# Patient Record
Sex: Female | Born: 1965 | Race: White | Hispanic: Yes | Smoking: Never smoker
Health system: Southern US, Community
[De-identification: ages and names within clinical notes are randomized; demographics above are authoritative.]

## PROBLEM LIST (undated history)

## (undated) DIAGNOSIS — I10 Essential (primary) hypertension: Secondary | ICD-10-CM

## (undated) DIAGNOSIS — G43909 Migraine, unspecified, not intractable, without status migrainosus: Secondary | ICD-10-CM

## (undated) DIAGNOSIS — E782 Mixed hyperlipidemia: Secondary | ICD-10-CM

## (undated) DIAGNOSIS — D649 Anemia, unspecified: Secondary | ICD-10-CM

## (undated) DIAGNOSIS — E669 Obesity, unspecified: Secondary | ICD-10-CM

## (undated) DIAGNOSIS — E1165 Type 2 diabetes mellitus with hyperglycemia: Secondary | ICD-10-CM

## (undated) DIAGNOSIS — E785 Hyperlipidemia, unspecified: Secondary | ICD-10-CM

## (undated) DIAGNOSIS — I2510 Atherosclerotic heart disease of native coronary artery without angina pectoris: Secondary | ICD-10-CM

## (undated) DIAGNOSIS — E119 Type 2 diabetes mellitus without complications: Secondary | ICD-10-CM

## (undated) DIAGNOSIS — K219 Gastro-esophageal reflux disease without esophagitis: Secondary | ICD-10-CM

## (undated) HISTORY — DX: Anemia, unspecified: D64.9

## (undated) HISTORY — DX: Obesity, unspecified: E66.9

## (undated) HISTORY — DX: Hyperlipidemia, unspecified: E78.5

## (undated) HISTORY — DX: Mixed hyperlipidemia: E78.2

## (undated) HISTORY — DX: Atherosclerotic heart disease of native coronary artery without angina pectoris: I25.10

## (undated) HISTORY — DX: Type 2 diabetes mellitus without complications: E11.9

## (undated) HISTORY — DX: Gastro-esophageal reflux disease without esophagitis: K21.9

## (undated) HISTORY — DX: Essential (primary) hypertension: I10

## (undated) HISTORY — DX: Type 2 diabetes mellitus with hyperglycemia: E11.65

## (undated) HISTORY — DX: Migraine, unspecified, not intractable, without status migrainosus: G43.909

## (undated) HISTORY — PX: CARDIAC CATHETERIZATION: SHX172

---

## 2003-01-17 ENCOUNTER — Ambulatory Visit (HOSPITAL_COMMUNITY): Admission: RE | Admit: 2003-01-17 | Discharge: 2003-01-17 | Payer: Self-pay | Admitting: *Deleted

## 2003-01-19 ENCOUNTER — Ambulatory Visit (HOSPITAL_COMMUNITY): Admission: RE | Admit: 2003-01-19 | Discharge: 2003-01-20 | Payer: Self-pay | Admitting: *Deleted

## 2003-05-30 ENCOUNTER — Ambulatory Visit (HOSPITAL_COMMUNITY): Admission: RE | Admit: 2003-05-30 | Discharge: 2003-05-30 | Payer: Self-pay | Admitting: Cardiology

## 2004-02-07 ENCOUNTER — Observation Stay (HOSPITAL_COMMUNITY): Admission: EM | Admit: 2004-02-07 | Discharge: 2004-02-08 | Payer: Self-pay | Admitting: Cardiology

## 2004-02-27 ENCOUNTER — Ambulatory Visit: Payer: Self-pay | Admitting: *Deleted

## 2006-01-22 ENCOUNTER — Encounter: Admission: RE | Admit: 2006-01-22 | Discharge: 2006-01-22 | Payer: Self-pay | Admitting: Family Medicine

## 2006-05-07 ENCOUNTER — Ambulatory Visit (HOSPITAL_COMMUNITY): Admission: RE | Admit: 2006-05-07 | Discharge: 2006-05-07 | Payer: Self-pay | Admitting: Otolaryngology

## 2007-03-10 ENCOUNTER — Ambulatory Visit (HOSPITAL_COMMUNITY): Admission: RE | Admit: 2007-03-10 | Discharge: 2007-03-10 | Payer: Self-pay | Admitting: Cardiovascular Disease

## 2007-03-10 ENCOUNTER — Ambulatory Visit: Payer: Self-pay | Admitting: Cardiovascular Disease

## 2007-03-11 ENCOUNTER — Inpatient Hospital Stay (HOSPITAL_BASED_OUTPATIENT_CLINIC_OR_DEPARTMENT_OTHER): Admission: RE | Admit: 2007-03-11 | Discharge: 2007-03-11 | Payer: Self-pay | Admitting: Cardiovascular Disease

## 2007-03-11 ENCOUNTER — Ambulatory Visit: Payer: Self-pay | Admitting: Cardiology

## 2007-04-07 ENCOUNTER — Ambulatory Visit: Payer: Self-pay | Admitting: Cardiovascular Disease

## 2010-09-10 NOTE — Assessment & Plan Note (Signed)
[facility]                       [facility] OFFICE NOTE   Hoffman, Yesenia                      MRN:          [id number]  DATE:03/10/2007                            DOB:          [birth year]    Yesenia Hoffman is a 45 year old patient, previously seen by Dr. Daleen Squibb and  Dr. [name]. She has coronary risk factors including hypertension,  hyperlipidemia, diabetes. She has a history of Taxus stenting of the LAD  in September of 2004. Repeat heart catheterization in 2005 showed the  stent to be patent.   The patient apparently had some thrombocytopenia after her last  intervention.   I am seeing her for the first time. She does not speak [place]. Her  significant other interpreted for me. She was seen in the clinic today  for increasing chest pain. This has been exertional, and it does sound  anginal and has been going on for the last two to three weeks. It  radiates up towards her neck and her back. It is identical to the pain  that she had prior to her stents. She has not had any nitroglycerin at  home today. She currently is not having any rest pain.   After discussion with Keayra through her significant other's  interpretation, I told her that she needed a heart catheterization. She  is currently taking aspirin but is not on Plavix.   We will likely give her prescriptions for it and have her loaded with  300 of Plavix tonight and take 300 in the morning. I do not think she  will have surgical disease. She did not have significant circumflex or  RCA disease.   Her review of systems is otherwise negative.   Her medications include:  1. Aspirin a day.  2. Atenolol 25 a day.  3. Senokot.  4. Metformin 500 b.i.d.  5. She has previously been on Zocor. I believe she had myalgias with      it.   Her exam is remarkable for an overweight, [place] female who does not  speak [place]. Affect is appropriate. Weight is 159. Blood pressure is  122/80, pulse 62 and regular, afebrile, respiratory rate 14.  HEENT:  Unremarkable.  No bruit, no lymphadenopathy, no thyromegaly, no JVP elevation.  LUNGS:  Clear. Good diaphragmatic motion. No wheezing.  S1 and S2 with distant heart sounds. PMI not palpable.  ABDOMEN:  Is benign. Bowel sounds are positive. No AAA. No bruit. No  hepatosplenomegaly. No hepatojugular reflux.  Distal pulses are intact. No edema.  NEUROLOGICAL:  Nonfocal. No musculoskeletal weakness.   EKG in the office today was normal.   IMPRESSION:  1. Recurrent chest pain, anginal sounding, in the setting of previous      stenting in 2004. Catheterization to be arranged. Hold metformin      tonight. Load with Plavix.  2. History of hypertension. Continue atenolol 25 a day. Blood pressure      currently under good control.  3. Diabetes. Check hemoglobin A1c. Continue oral hypoglycemics. Follow      up with primary care M.D. who I believe is with the health  department.  4. Hyperlipidemia. We will have to readdress this. It is not clear to      me exactly why her Zocor was stopped, but particularly if she has      restenosis, she will need to be on a statin drug.     Noralyn Pick. Eden Emms, MD, [facility]  Electronically Signed    PCN/MedQ  DD: 03/10/2007  DT: 03/11/2007  Job #: [id number]

## 2010-09-10 NOTE — Assessment & Plan Note (Signed)
[facility]                        CARDIOLOGY OFFICE NOTE   Yesenia, MCCAMY                      MRN:          [id number]  DATE:04/07/2007                            DOB:          [birth year]    Yesenia Hoffman returns today for followup.  She does not speak [place];  she is from [place].  Her husband translated for [place].  She complained of  recurrent chest pain when I last saw her.  She had a previous Taxus  stent to the LAD.  She had repeat heart catheterization by Dr. Riley Kill  on March 11, 2007.  The stent was widely patent and there was no  residual disease.  She has done well since her discharge.  She is a  diabetic.  Her blood pressure is borderline.  She is not on an ARB or an  ACE.  She previously had been on Diovan; I am not sure why it was  stopped.  I told her I would prefer to start her back on lisinopril  today.  She will get her BMET checked in 6 weeks at the health  department.   REVIEW OF SYSTEMS:  Otherwise negative.  Particularly, she is not having  any recurrent chest pain.  There has been no pain at the catheterization  site, no PND or orthopnea, no palpitations or syncope.  Her review of  systems is otherwise.   MEDICATIONS:  1. An aspirin a day.  2. Senokot.  3. Metformin 500 b.i.d.  4. Glipizide 5 b.i.d.  5. Diazepam 5 b.i.d.  6. Atenolol 50 mg a day.   EXAMINATION:  Remarkable for an overweight [place] female in no  distress.  Affect is appropriate.  Weight is 164, blood pressure is  140/90, pulse 70 and regular, afebrile, respiratory rate 14.  HEENT:  Unremarkable.  Carotids normal without bruit.  No  lymphadenopathy, thyromegaly or JVP elevation.  LUNGS:  Clear with good diaphragmatic motion, no wheezing.  S1, S2 with normal heart sounds.  PMI not palpable.  ABDOMEN:  Benign.  No renal artery stenosis or bruits.  No AAA.  No  tenderness.  No hepatosplenomegaly or hepatojugular reflux.  Catheterization  site is well healed with no ecchymosis or bruit.  Distal pulses are intact, no edema.  NEUROLOGIC:  Nonfocal.  No muscular weakness.  SKIN:  Warm and dry.   IMPRESSION:  1. Coronary disease with recurrent chest pain.  Stent widely patent.      Continue aspirin and beta blocker.  2. Diabetes with hypertension.  Add lisinopril 10 mg a day.  Followup      BMET in 6 weeks.  3. Coronary disease with dyslipidemia.  The patient had previously      been on Zocor.  Again, I      am not sure why this has been stopped.  She will have a lipid and      liver profile and consider reinstituting statin drug.   I will see her back in 6 months.     Noralyn Pick. Eden Emms, MD, [facility]  Electronically Signed    PCN/MedQ  DD: 04/07/2007  DT: 04/08/2007  Job #: [id number]

## 2010-09-10 NOTE — Cardiovascular Report (Signed)
Yesenia Hoffman, Yesenia Hoffman               ACCOUNT NO.:  [id number]   MEDICAL RECORD NO.:  [id number]          PATIENT TYPE:  OIB   LOCATION:  1962                         FACILITY:  [facility]   PHYSICIAN:  Arturo Morton. Riley Kill, MD, FACCDATE OF BIRTH:  Nov 24, 1965   DATE OF PROCEDURE:  03/11/2007  DATE OF DISCHARGE:  03/11/2007                            CARDIAC CATHETERIZATION   INDICATIONS:  Ms. Seyller is a 45 year old female who presents with some  recurrent chest pain.  She previously had a drug-eluting stent placed in  the proximal left anterior descending artery.  She has been off Plavix  now for approximately 2 years.   PROCEDURE:  1. Left heart catheterization.  2. Selective coronary arteriography.  3. Selective left ventriculography.   DESCRIPTION OF THE PROCEDURE:  The patient was brought to the  catheterization laboratory and prepped and draped in the usual fashion.  Through an anterior puncture the femoral artery was entered and a 4-  [place] sheath was placed.  Views of the left and right coronary arteries  were then obtained in multiple angiographic projections.  Central aortic  and left ventricular pressures were measured with a pigtail catheter.  Ventriculography was subsequently performed in the RAO projection  without complication.  The pigtail catheter was subsequently removed.  She tolerated the procedure well.  There were no major complications.  We used an interpreter both to gain a consent and also during the  procedure to assist in communication.   HEMODYNAMIC DATA:  1. Central aortic pressure 170/95, mean 125.  2. Left ventricular pressure 165/11.   ANGIOGRAPHIC DATA:  1. Ventriculography was done in the RAO projection.  LV function was      well preserved.  No segmental abnormalities of contraction were      identified.  2. The left main was free of critical disease.  3. The left anterior descending artery coursed to the apex.  There was      stented proximal LAD  with minimal luminal irregularity.  The stent      had less than 20% narrowing.  There was a diagonal branch that came      out in the middle of the stent with about 40% ostial narrowing.      The second and third diagonal branches were free of critical      disease.  4. The circumflex is a large-caliber vessel providing two major      marginal branches.  There is a tiny first marginal branch that is      insignificant.  Prior to the first marginal branch there is      approximately 20% mild irregularity.  5. The right coronary artery is a large-caliber vessel providing      posterior descending and posterolateral branch, both of which      appear free of critical disease.   CONCLUSIONS:  1. Normal left ventricular function.  2. Continued patency of the Taxus drug-eluting stent.  3. Hypertension.   The patient will be given her medicines blood pressure medicines for  today.      Arturo Morton. Riley Kill, MD, [facility]  Electronically Signed     TDS/MEDQ  D:  03/11/2007  T:  03/12/2007  Job:  [id number]   cc:   Noralyn Pick. Eden Emms, MD, [facility]  CV Laboratory

## 2010-09-13 NOTE — Procedures (Signed)
Yesenia, Hoffman                           ACCOUNT NO.:  [id number]   MEDICAL RECORD NO.:  [id number]                  PATIENT TYPE:  PREC   LOCATION:                                       FACILITY:   PHYSICIAN:  [name], M.D.                DATE OF BIRTH:   DATE OF PROCEDURE:  DATE OF DISCHARGE:                                    STRESS TEST   INDICATION:  Yesenia Hoffman is a 45 year old female with known coronary artery  disease status post stent to her LAD in September of 2004, and PTCA to her  first diagonal in September as well.  She now presents with recurrent chest  discomfort.   BASELINE DATA:  EKG reveals sinus rhythm at 67 beats per minute with  nonspecific ST-abnormalities.  Blood pressure is 140/86.  The patient  exercised for a total of six minutes and 36 seconds to Bruce protocol stage  3.  Maximum heart rate was 163 beats per minute which is 90% of predicted  maximum.  Maximal blood pressure is 200/98 which resolved down to 138/90 in  recovery.  EKG revealed no arrhythmias and no ischemic changes.  The patient  did report chest discomfort radiating to her jaws and shortness of breath.  This resolved in recovery.   Final images and results are pending M.D. review.     ________________________________________  ___________________________________________  Jae Dire, P.A. [facility]                      [name], M.D.   AB/MEDQ  D:  05/30/2003  T:  05/30/2003  Job:  [id number]

## 2010-09-13 NOTE — Cardiovascular Report (Signed)
Yesenia Hoffman, Yesenia Hoffman               ACCOUNT NO.:  [id number]   MEDICAL RECORD NO.:  [id number]          PATIENT TYPE:  OBV   LOCATION:  6527                         FACILITY:  [facility]   PHYSICIAN:  Salvadore Farber, M.D. LHCDATE OF BIRTH:  1965/12/25   DATE OF PROCEDURE:  02/27/2004  DATE OF DISCHARGE:  02/08/2004                              CARDIAC CATHETERIZATION   PROCEDURE:  Left heart catheterization, left ventriculography, coronary  angiography.   INDICATIONS FOR PROCEDURE:  Ms. Horlacher is a 45 year old lady with coronary  artery disease status post LAD stent who presents with chest discomfort  concerning for unstable angina.  She was ruled out for myocardial infarction  by serial enzymes and EKGs.  She was referred for diagnostic angiography.   PROCEDURE TECHNIQUE:  Informed consent was obtained.  Under 1% lidocaine  local anesthesia, a 5 French sheath was placed in the right common femoral  artery using the modified Seldinger technique.  Diagnostic angiography and  ventriculography were performed using JL4, JR4, and pigtail catheters.  The  patient tolerated the procedure well and was transferred to the holding room  in stable condition.  The sheaths will be removed there.   COMPLICATIONS:  None.   FINDINGS:  1.  Left ventricle:  154/15/25.  EF 60% without regional wall motion      abnormality.  2.  No aortic stenosis or mitral regurgitation.  3.  Left main:  Angiographically normal.  4.  LAD:  Moderate size vessel giving rise to two relatively small diagonal      branches.  There is a widely patent stent in the proximal LAD jailing      the first diagonal branch.  This first diagonal has a 60% ostial      stenosis.  The remainder of the LAD and diagonals are normal.  5.  Circumflex:  Moderate size vessel giving rise to two obtuse marginals.      It is angiographically normal.  6.  Right coronary artery:  Moderate size dominant vessel.  It is      angiographically  normal.   IMPRESSION/RECOMMENDATIONS:  There is a widely patent LAD stent with  moderate stenosis at the first diagonal.  This diagonal stenosis is likely  chronic related to jailing with the LAD stent.  I doubt that myocardial  ischemia was the etiology of her resting chest discomfort.  We will plan on  discharging her home today.       WED/MEDQ  D:  02/27/2004  T:  02/27/2004  Job:  [id number]   cc:   Jonelle Sidle, M.D. [facility]   Xaje Hasanaj  [address]  [place]  [place] [address]  Fax: [phone]

## 2010-09-13 NOTE — Cardiovascular Report (Signed)
[name], [name]                           ACCOUNT NO.:  [id number]   MEDICAL RECORD NO.:  [id number]                   PATIENT TYPE:  OIB   LOCATION:  2869                                 FACILITY:  [facility]   PHYSICIAN:  Veneda Melter, M.D.                   DATE OF BIRTH:  1965-07-15   DATE OF PROCEDURE:  01/19/2003  DATE OF DISCHARGE:                              CARDIAC CATHETERIZATION   PROCEDURES PERFORMED:  PTCA and stent placement proximal left anterior  descending.   DIAGNOSES:  1. Single vessel coronary artery disease.  2. Angina pectoris.   HISTORY:  Yesenia Hoffman is a 45 year old Hispanic female who presents with  crescendo angina.  The patient had abnormal stress test and underwent  cardiac catheterization showing high grade narrowing of 80% in the proximal  LAD encompassing the first diagonal branch.  She is referred for  percutaneous coronary intervention.   TECHNIQUE:  Informed consent was obtained.  An existing 6-French sheath in  the right groin was utilized.  The patient had been pre treated with  aspirin.  She was given heparin and ReoPro on a weight adjusted basis to  maintain an ACT of greater than 225 seconds and Plavix 600 mg was given as  well.  A 6-French CLS3.5 guide catheter was used to engage the left coronary  artery and selective guide shots obtained.  This confirmed the presence of  high grade narrowing of 80% in the proximal LAD encompassing the first  diagonal branch with ostial narrowing of 50-60% in this first diagonal  branch.  A 0.014 inch ATW marker wires advanced in the distal LAD and used  to size the vessel diameter lesion and length.  A 3 x 16 mm Taxus Express-2  stent was then introduced, carefully positioned in the proximal LAD, and  deployed at 10 atmospheres for 45 seconds.  A 3 x 12 mm Quantum Maverick  balloon was then used to post dilate the stent.  Two inflations were  performed at 12 atmospheres for 30 seconds.  A second  inflation of 16  atmospheres for 30 seconds within the proximal segment of the stent.  Repeat  angiography showed an excellent result with full coverage of the lesion, no  residual stenosis or vessel damage.  The diagonal branch was further  compromised with ostial narrowing of 70-80%, although it did appear to have  TIMI grade 3 flow.  The wire was repositioned in the first diagonal branch  and a 2.5 x 9 mm Maverick balloon used to dilate the ostium.  Two inflations  were performed at 6 atmospheres for 15 seconds.  Repeat angiography now  showed an excellent result with less than 30% residual narrowing at the  ostium of the diagonal branch.  There was no vessel damage and TIMI 3 flow  through the LAD and its branches.  Final angiography was performed in  various projections confirming these findings.  The guide catheter was then  removed and an AngioSeal closure device deployed to the right femoral artery  until adequate hemostasis was achieved.  The patient tolerated procedure  well, was transferred to the floor in stable condition.   FINAL RESULTS:  Successful PTCA and stent placement in the proximal LAD with  reduction of 80% narrowing to 0% with placement of 3.0 x 16 mm Taxus Express-  2 stent.                                                Veneda Melter, M.D.    Melton Alar  D:  01/19/2003  T:  01/19/2003  Job:  [id number]

## 2010-09-13 NOTE — Cardiovascular Report (Signed)
Yesenia Hoffman, [name]                           ACCOUNT NO.:  [id number]   MEDICAL RECORD NO.:  [id number]                   PATIENT TYPE:  OIB   LOCATION:  2869                                 FACILITY:  [facility]   PHYSICIAN:  Jonelle Sidle, M.D. [facility]        DATE OF BIRTH:  12/16/1965   DATE OF PROCEDURE:  01/19/2003  DATE OF DISCHARGE:                              CARDIAC CATHETERIZATION   PRIMARY CARE:  [facility].   CARDIOLOGIST:  [name], M.D.   INDICATIONS:  Unstable angina and abnormal exercise treadmill test.   PROCEDURES PERFORMED:  1. Left heart catheterization.  2. Selective coronary angiography.  3. Left ventriculography.   ACCESS AND EQUIPMENT:  The area about the right femoral artery was  anesthetized with 1% lidocaine and a 6-French sheath was placed in the right  femoral artery via the modified Seldinger technique.  Standard preformed 6-  [place] and JR4 catheters were used for selective coronary angiography  and an angled pigtail catheter was used for left heart catheterization and  left ventriculography.  Exchanges were made over a wire and the patient  tolerated the procedure well without immediate complications.  She did  develop mild chest discomfort during angiography and was ultimately placed  on a low dose nitroglycerin drip with improvement in symptoms.  She remained  hemodynamically stable.   HEMODYNAMICS:  1. Left ventricle 139/20 mmHg post angiography.  2. Aorta 139/90 mmHg.   ANGIOGRAPHIC FINDINGS:  1. The left main coronary artery is free of significant flow limiting     coronary atherosclerosis.  2. The left anterior descending is a medium caliber vessel with two diagonal     branches.  There is a fairly focal 90% stenosis in the proximal vessel     involving the ostium of the proximal diagonal branch.  Flow is TIMI 3.  3. The circumflex coronary artery is free of significant flow limiting     coronary  atherosclerosis.  There are two obtuse marginal branches.  4. The right coronary artery is a dominant and free of significant flow     limiting coronary atherosclerosis.   LEFT VENTRICULOGRAPHY:  Left ventriculography was performed in the RAO  projection revealing an ejection fraction of approximately 65% with no focal  wall motion abnormalities or mitral regurgitation.   DIAGNOSES:  1. Severe single vessel coronary artery disease involving the proximal left     anterior descending.  2. Left ventricular ejection fraction of approximately 65% with no     significant mitral regurgitation.    RECOMMENDATIONS:  I have discussed the films with Veneda Melter, M.D.  Assuming that there is no change in the left anterior descending following  intracoronary nitroglycerin (excludes spasm), then plan will be to proceed  with percutaneous intervention.  Jonelle Sidle, M.D. [facility]    SGM/MEDQ  D:  01/19/2003  T:  01/19/2003  Job:  [id number]

## 2010-09-13 NOTE — Discharge Summary (Signed)
[name], Yesenia Hoffman                           ACCOUNT NO.:  [id number]   MEDICAL RECORD NO.:  [id number]                   PATIENT TYPE:  OIB   LOCATION:  6532                                 FACILITY:  [facility]   PHYSICIAN:  [name], M.D.                DATE OF BIRTH:  20-Oct-1965   DATE OF ADMISSION:  01/19/2003  DATE OF DISCHARGE:                                 DISCHARGE SUMMARY   DISCHARGE DIAGNOSES:  1. Chest discomfort and abnormal exercise treadmill test.  2. Coronary artery disease.     a. Status post TAXUS stent to the proximal left anterior descending.  3. Hypertension.  4. Thrombocytopenia.  5. Hyperglycemia.  6. Dyslipidemia.   PROCEDURES PERFORMED -- THIS ADMISSION:  Cardiac catheterization and  percutaneous coronary intervention by Dr. Veneda Melter on January 19, 2003.   HOSPITAL COURSE:  Please see the office note by Dr. [name] for  complete details.  Briefly, this 45 year old female presented to our  [name] office for evaluation of chest discomfort.  She underwent  exercise treadmill testing.  During that time, she began to have chest  discomfort and ST depression in the inferolateral leads.  During recovery,  she had downsloping ST depression and T wave inversions in the inferolateral  leads.  Nitroglycerin relieved her pain.  She was referred for  catheterization to further evaluate.  This was performed on January 19, 2003.  Dr. [name] performed intervention of the proximal LAD by placing a  TAXUS stent and reducing stenosis from 80% to 0%.  The patient tolerated the  procedure well and had no immediate complications.  On the morning of  January 20, 2003, she was in stable condition.  Her vital signs were  stable and her right groin was without hematoma.  Her morning labs did show  that her platelet count had dropped from 92,000 to 74,000.  She would need  close followup with this with the addition of aspirin and Plavix to her  medical  regimen.  Her creatinine was stable at 0.6.  Glucose was elevated at  136 on her morning fasting labs.  Hemoglobin A1c will be sent off at the  time of discharge to rule out diabetes mellitus.  Given her known coronary  disease, she was started on a statin and baseline LFTs and lipids will be  checked prior to discharge.  She will need followup next week in our  [name] office with Dr. [name] with a CBC.   LABORATORIES:  White count 7800, hemoglobin 11.9, hematocrit 34%, platelet  count 74,000.  Sodium 137, potassium 3.9, BUN 8, creatinine 0.6, glucose  136.  Post-procedure CK-MB 0.5.   Admission chest x-ray:  No acute cardiopulmonary process.   DISCHARGE MEDICATIONS:  1. Aspirin 325 mg daily.  2. Plavix 75 mg daily.  3. Lopressor 25 mg twice daily.  4. Nitroglycerin as needed for (p.r.n.) chest pain.  5. Zocor 20 mg nightly.   PAIN MANAGEMENT:  1. Tylenol as needed.  2. Nitroglycerin as needed for chest pain.  3. She is to call our office in [place] or 911 with recurrent chest pain.   ACTIVITY:  No driving for at least three days.  No heavy lifting, exertion  or sex for two weeks.   DIET:  Low fat, low sodium.   WOUND CARE:  The patient is to call our office in [place] for any groin  swelling, bleeding or bruising.   FOLLOWUP:  Followup is with Dr. [name] in [place] next week.  The office  will call her with an appointment.  She will have a CBC checked next week at  her followup appointment to recheck her platelet count.  She will need  lipids and LFTs checked in about eight weeks.  At her followup appointment,  we will follow up on her hemoglobin A1c to make sure that she is not a  diabetic.  If her hemoglobin A1c is elevated, she will need referral to a  primary care physician to follow her diabetes.       [name], P.A.                        [name], M.D.    SW/MEDQ  D:  01/20/2003  T:  01/20/2003  Job:  [id number]   cc:   [name],  M.D.  Fax: [phone]

## 2010-09-13 NOTE — Discharge Summary (Signed)
Yesenia Hoffman, Yesenia Hoffman               ACCOUNT NO.:  [id number]   MEDICAL RECORD NO.:  [id number]          PATIENT TYPE:  INP   LOCATION:  2853                         FACILITY:  [facility]   PHYSICIAN:  Jesse Sans. Wall, M.D.   DATE OF BIRTH:  Sep 05, 1965   DATE OF ADMISSION:  02/07/2004  DATE OF DISCHARGE:                                 DISCHARGE SUMMARY   ADDENDUM:  The patient will also be sent home on Protonix 40 mg a day to  cover for possible gastroesophageal reflux disease etiology for her chest  pain.       SW/MEDQ  D:  02/08/2004  T:  02/08/2004  Job:  [id number]   cc:   [name], M.D.  Fax: [phone]   Annette Stable Hasanaj  [address]  [place]  [place] [address]  Fax: [phone]

## 2010-09-13 NOTE — Discharge Summary (Signed)
Yesenia Hoffman, Yesenia Hoffman               ACCOUNT NO.:  [id number]   MEDICAL RECORD NO.:  [id number]          PATIENT TYPE:  INP   LOCATION:  2853                         FACILITY:  [facility]   PHYSICIAN:  Jesse Sans. Wall, M.D.   DATE OF BIRTH:  [birth year]   DATE OF ADMISSION:  02/07/2004  DATE OF DISCHARGE:  02/08/2004                                 DISCHARGE SUMMARY   DISCHARGE DIAGNOSIS:  1.  Chest pain, etiology unclear.  2.  Coronary artery disease.      1.  Status post Taxos stenting to the left anterior descending  in          September 2004.      2.  Patent stent by catheterization by admission.  3.  Good left ventricular  function.  4.  Hypertension.  5.  Dyslipidemia.  6.  Diabetes mellitus type 2.  7.  History of transient thrombocytopenia following intervention in 2004.   PROCEDURE:  Cardiac catheterization by Dr. Samule Ohm on February 08, 2004,  revealing patent stent to the LAD, 60% ostial first diagonal lesion and a  small vessel, EF 60%.   HOSPITAL COURSE:  Please see the consult note from Dr. Samule Ohm at [facility] on February 06, 2004, for complete details.  Briefly, this 45-year-  old female patient was admitted to [facility] with recurrent chest  discomfort.  Her cardiac markers were normal.  Chest x-ray showed no acute  abnormalities.  EKG showed nonspecific T-wave changes.  She was transferred  to [facility] for further evaluation to include cardiac  catheterization.  This was performed by Dr. Clovis Riley on February 08, 2004.  The  results are as noted above.  Her pain was not felt to be secondary to  ischemia.  She was continued on her medical therapy.  At the time of this  dictation, she is still pending post catheterization bedrest.  As long as  her groin remains stable post catheterization, she will be discharged home  later tonight.  She will need follow up with Dr. [name] in [place] in the  next couple of weeks.   LABORATORY DATA:  Chest x-ray  as noted above.  White count 9100, hemoglobin  12.3, hematocrit 34.3, platelet count 176,000.  INR 1.07.  Sodium 133,  potassium 4, chloride 99, CO2 27.  Glucose 168.  BUN 10, creatinine 0.6.  Calcium 8.8.  Total cholesterol 121, triglyceride 707, HDL 25, LDL not  calculated.   DISCHARGE MEDICATIONS:  Aspirin 325 mg daily, Plavix 75 mg daily, Atenolol  25 mg daily, Senokot p.r.n., Diovan 160 mg daily, Zocor 20 mg q.h.s.,  nitroglycerin p.r.n. chest pain, Metformin 500 mg b.i.d., the patient has to  restart this on Sunday, February 11, 2004.  Pain management, Tylenol as  needed.  Nitroglycerin as needed for chest pain.   DISCHARGE INSTRUCTIONS:  She is to call our office or 911 for recurrent  chest pain.  Activity:  No driving, heavy exertion, work or sex for the next  three days.  Diet is low fat, low sodium, diabetic diet.  Wound care:  The  patient is to call for any groin swelling, bleeding, or bruising.   SPECIAL INSTRUCTIONS:  She has been instructed not to take her Metformin  (Glucophage) until Sunday, February 11, 2004.   FOLLOW UP:  Dr. [name] in two weeks and the office will contact her for an  appointment.  [name] on her abnormal lipid  panel.  Her triglycerides are high as noted above.  Further adjustments in  her therapy can be made at the time of her follow up in our [employer]       SW/MEDQ  D:  02/08/2004  T:  02/08/2004  Job:  [id number]

## 2011-02-04 LAB — POCT I-STAT GLUCOSE: Glucose, Bld: 122 — ABNORMAL HIGH

## 2011-11-11 ENCOUNTER — Other Ambulatory Visit (HOSPITAL_COMMUNITY): Payer: Self-pay | Admitting: Nurse Practitioner

## 2011-11-11 DIAGNOSIS — N852 Hypertrophy of uterus: Secondary | ICD-10-CM

## 2011-11-14 ENCOUNTER — Ambulatory Visit (HOSPITAL_COMMUNITY)
Admission: RE | Admit: 2011-11-14 | Discharge: 2011-11-14 | Disposition: A | Discharge status: Home / Self Care | Payer: Self-pay | Source: Ambulatory Visit | Attending: Family Medicine | Admitting: Family Medicine

## 2011-11-14 DIAGNOSIS — N852 Hypertrophy of uterus: Secondary | ICD-10-CM

## 2011-11-14 DIAGNOSIS — R9389 Abnormal findings on diagnostic imaging of other specified body structures: Secondary | ICD-10-CM | POA: Insufficient documentation

## 2011-11-14 DIAGNOSIS — D259 Leiomyoma of uterus, unspecified: Secondary | ICD-10-CM | POA: Insufficient documentation

## 2012-03-16 ENCOUNTER — Other Ambulatory Visit (HOSPITAL_COMMUNITY): Payer: Self-pay | Admitting: *Deleted

## 2012-03-16 DIAGNOSIS — N83201 Unspecified ovarian cyst, right side: Secondary | ICD-10-CM

## 2012-03-16 DIAGNOSIS — D219 Benign neoplasm of connective and other soft tissue, unspecified: Secondary | ICD-10-CM

## 2012-03-23 ENCOUNTER — Ambulatory Visit (HOSPITAL_COMMUNITY)
Admission: RE | Admit: 2012-03-23 | Discharge: 2012-03-23 | Disposition: A | Discharge status: Home / Self Care | Payer: Self-pay | Source: Ambulatory Visit | Attending: *Deleted | Admitting: *Deleted

## 2012-03-23 ENCOUNTER — Ambulatory Visit (HOSPITAL_COMMUNITY): Payer: Self-pay

## 2012-03-23 DIAGNOSIS — D259 Leiomyoma of uterus, unspecified: Secondary | ICD-10-CM | POA: Insufficient documentation

## 2012-03-23 DIAGNOSIS — N83201 Unspecified ovarian cyst, right side: Secondary | ICD-10-CM

## 2012-03-23 DIAGNOSIS — N83209 Unspecified ovarian cyst, unspecified side: Secondary | ICD-10-CM | POA: Insufficient documentation

## 2012-03-23 DIAGNOSIS — D219 Benign neoplasm of connective and other soft tissue, unspecified: Secondary | ICD-10-CM

## 2012-08-05 ENCOUNTER — Encounter: Payer: Self-pay | Admitting: *Deleted

## 2012-08-06 ENCOUNTER — Ambulatory Visit (INDEPENDENT_AMBULATORY_CARE_PROVIDER_SITE_OTHER): Payer: Self-pay | Admitting: Cardiovascular Disease

## 2012-08-06 ENCOUNTER — Encounter: Payer: Self-pay | Admitting: Cardiovascular Disease

## 2012-08-06 VITALS — BP 100/80 | HR 66 | Ht 60.0 in | Wt 152.0 lb

## 2012-08-06 DIAGNOSIS — E119 Type 2 diabetes mellitus without complications: Secondary | ICD-10-CM

## 2012-08-06 DIAGNOSIS — R079 Chest pain, unspecified: Secondary | ICD-10-CM

## 2012-08-06 DIAGNOSIS — I10 Essential (primary) hypertension: Secondary | ICD-10-CM

## 2012-08-06 NOTE — Assessment & Plan Note (Signed)
Well controlled.  Continue current medications and low sodium Dash type diet.    

## 2012-08-06 NOTE — Assessment & Plan Note (Signed)
Hopefully will improve on insulin F/U [facility]  Discussed low carb diet

## 2012-08-06 NOTE — Progress Notes (Signed)
Patient ID: Yesenia Hoffman, female   DOB: August 22, 1965, 47 y.o.   MRN: [id number] 47 yo patient Dr Riley Kill  And Diona Browner previous stent to LAD.in 2004   Last cath with patency done 02/2007  ANGIOGRAPHIC DATA:  1. Ventriculography was done in the RAO projection. LV function was  well preserved. No segmental abnormalities of contraction were  identified.  2. The left main was free of critical disease.  3. The left anterior descending artery coursed to the apex. There was  stented proximal LAD with minimal luminal irregularity. The stent  had less than 20% narrowing. There was a diagonal branch that came  out in the middle of the stent with about 40% ostial narrowing.  The second and third diagonal branches were free of critical  disease.  4. The circumflex is a large-caliber vessel providing two major  marginal branches. There is a tiny first marginal branch that is  insignificant. Prior to the first marginal branch there is  approximately 20% mild irregularity.  5. The right coronary artery is a large-caliber vessel providing  posterior descending and posterolateral branch, both of which  appear free of critical disease.  CONCLUSIONS:  1. Normal left ventricular function.  2. Continued patency of the Taxus drug-eluting stent.  3. Hypertension.  Recently put on insulin for poorly controlled DM A1 over 8.5  SSCP not like pre stent.  Sometimes exertional but not always. Can have it all day.  No acute ECG changes in [facility] On levaquin for sinus infection and this may be upsetting her stomach more  ROS: Denies fever, malais, weight loss, blurry vision, decreased visual acuity, cough, sputum, SOB, hemoptysis, pleuritic pain, palpitaitons, heartburn, abdominal pain, melena, lower extremity edema, claudication, or rash.  All other systems reviewed and negative   General: Affect appropriate Healthy:  appears stated age HEENT: normal Neck supple with no adenopathy JVP  normal no bruits no thyromegaly Lungs clear with no wheezing and good diaphragmatic motion Heart:  S1/S2 soft systolic murmur,rub, gallop or click PMI normal Abdomen: benighn, BS positve, no tenderness, no AAA no bruit.  No HSM or HJR Distal pulses intact with no bruits No edema Neuro non-focal Skin warm and dry No muscular weakness  Medications Current Outpatient Prescriptions  Medication Sig Dispense Refill  . amLODipine (NORVASC) 10 MG tablet Take 10 mg by mouth daily.      Marland Kitchen aspirin 325 MG tablet Take 325 mg by mouth daily.      Marland Kitchen atenolol (TENORMIN) 100 MG tablet Take 100 mg by mouth daily.      . fexofenadine (ALLEGRA) 180 MG tablet Take 180 mg by mouth daily.      . fish oil-omega-3 fatty acids 1000 MG capsule Take 1 g by mouth 2 (two) times daily.      Marland Kitchen glipiZIDE (GLUCOTROL) 10 MG tablet Take 10 mg by mouth 2 (two) times daily before a meal.      . ibuprofen (ADVIL,MOTRIN) 200 MG tablet Take 200 mg by mouth 2 (two) times daily as needed for pain.      Marland Kitchen insulin NPH-regular (NOVOLIN 70/30) (70-30) 100 UNIT/ML injection Inject 10 Units into the skin 2 (two) times daily with a meal.      . lisinopril-hydrochlorothiazide (PRINZIDE,ZESTORETIC) 20-25 MG per tablet Take 1 tablet by mouth daily.      . metFORMIN (GLUCOPHAGE) 1000 MG tablet Take 1,000 mg by mouth 2 (two) times daily with a meal.      .  Sennosides (TGT NATURAL LAXATIVE PILLS) 25 MG TABS Take 1 tablet by mouth daily.       No current facility-administered medications for this visit.    Allergies Review of patient's allergies indicates no known allergies.  Family History: No family history on file.  Social History: History   Social History  . Marital Status: Married    Spouse Name: N/A    Number of Children: N/A  . Years of Education: N/A   Occupational History  . Not on file.   Social History Main Topics  . Smoking status: Never Smoker   . Smokeless tobacco: Not on file  . Alcohol Use: Yes  . Drug  Use: Not on file  . Sexually Active: Not on file   Other Topics Concern  . Not on file   Social History Narrative  . No narrative on file    Electrocardiogram:[facility] 4[facility]10 NSR T wave inversion lead 3 no old MI  Assessment and Plan

## 2012-08-06 NOTE — Patient Instructions (Addendum)
Your physician recommends that you schedule a follow-up appointment in: 6 MONTHS WITH PN   Your physician has requested that you have an exercise tolerance test. For further information please visit [email or web address] Please also follow instruction sheet, as given.

## 2012-08-06 NOTE — Assessment & Plan Note (Signed)
Some atypical features with multiple caths since LAD stent in 2004 with no restenosis.  Reasonable to start with ETT and if abnormal then proceed with cath.

## 2012-08-06 NOTE — Addendum Note (Signed)
Addended by: Derry Lory A on: 08/06/2012 03:40 PM   Modules accepted: Orders

## 2012-08-09 ENCOUNTER — Encounter: Payer: Self-pay | Admitting: Cardiovascular Disease

## 2012-08-19 ENCOUNTER — Ambulatory Visit (HOSPITAL_COMMUNITY)
Admission: RE | Admit: 2012-08-19 | Discharge: 2012-08-19 | Disposition: A | Discharge status: Home / Self Care | Payer: Self-pay | Source: Ambulatory Visit | Attending: Cardiovascular Disease | Admitting: Cardiovascular Disease

## 2012-08-19 ENCOUNTER — Encounter (HOSPITAL_COMMUNITY): Payer: Self-pay | Admitting: Cardiology

## 2012-08-19 DIAGNOSIS — R079 Chest pain, unspecified: Secondary | ICD-10-CM | POA: Insufficient documentation

## 2012-08-19 DIAGNOSIS — R0989 Other specified symptoms and signs involving the circulatory and respiratory systems: Secondary | ICD-10-CM | POA: Insufficient documentation

## 2012-08-19 DIAGNOSIS — R0609 Other forms of dyspnea: Secondary | ICD-10-CM | POA: Insufficient documentation

## 2012-08-19 NOTE — Progress Notes (Addendum)
Stress Lab Nurses Notes - Yesenia Hoffman  Yesenia Hoffman 08/19/2012 Reason for doing test: Chest Pain and Dyspnea Type of test: Regular GTX Nurse performing test: Parke Poisson, RN Nuclear Medicine Tech: Not Applicable Echo Tech: Not Applicable MD performing test: R. Suzzette Gasparro & Joni Reining NP Family MD: [facility] Test explained and consent signed: yes IV started: No IV started Symptoms: SOB Treatment/Intervention: None Reason test stopped: fatigue and SOB After recovery IV was: NA Patient to return to Nuc. Med at : NA Patient discharged: Home Patient's Condition upon discharge was: stable Comments: During test peak BP 190/97 & HR 117.   Recovery BP 140/89 & HR 73.  Symptoms resolved in recovery. Erskine Speed T  Graded Exercise Test-Interpretation      Graded exercise performed to a work load of 7 METs and a heart rate of 117, 68 % of age-predicted maximum.  Exercise discontinued due to dyspnea and fatigue; no chest discomfort reported.  Blood pressure increased from a resting value of 105/70 to 155/85 at peak exercise.  No arrhythmias noted.  EKG: Normal sinus rhythm; nonspecific T wave abnormality. Stress EKG:  Nonsignificant flat and upsloping ST segment depression. Impression:  Nondiagnostic graded exercise test due to failure to achieve an adequate increase in heart rate in the setting of limited exercise tolerance. No significant ST segment abnormalities at a submaximal workload and heart rate.  Other findings as noted.  [place] Bing, M.D.

## 2012-11-04 ENCOUNTER — Other Ambulatory Visit (HOSPITAL_COMMUNITY): Payer: Self-pay | Admitting: Physician Assistant

## 2012-11-04 DIAGNOSIS — Z139 Encounter for screening, unspecified: Secondary | ICD-10-CM

## 2012-12-06 ENCOUNTER — Ambulatory Visit (HOSPITAL_COMMUNITY)
Admission: RE | Admit: 2012-12-06 | Discharge: 2012-12-06 | Disposition: A | Discharge status: Home / Self Care | Payer: Self-pay | Source: Ambulatory Visit | Attending: Physician Assistant | Admitting: Physician Assistant

## 2012-12-06 DIAGNOSIS — Z139 Encounter for screening, unspecified: Secondary | ICD-10-CM

## 2013-07-27 DIAGNOSIS — I2510 Atherosclerotic heart disease of native coronary artery without angina pectoris: Secondary | ICD-10-CM

## 2013-07-27 HISTORY — DX: Atherosclerotic heart disease of native coronary artery without angina pectoris: I25.10

## 2013-07-31 ENCOUNTER — Encounter: Payer: Self-pay | Admitting: Cardiology

## 2013-08-29 ENCOUNTER — Encounter: Payer: Self-pay | Admitting: Cardiology

## 2013-08-29 ENCOUNTER — Ambulatory Visit (INDEPENDENT_AMBULATORY_CARE_PROVIDER_SITE_OTHER): Payer: Self-pay | Admitting: Cardiology

## 2013-08-29 VITALS — BP 128/81 | HR 76 | Ht 60.0 in | Wt 159.0 lb

## 2013-08-29 DIAGNOSIS — R079 Chest pain, unspecified: Secondary | ICD-10-CM

## 2013-08-29 DIAGNOSIS — I2510 Atherosclerotic heart disease of native coronary artery without angina pectoris: Secondary | ICD-10-CM

## 2013-08-29 MED ORDER — NITROGLYCERIN 0.4 MG SL SUBL: 0.4000 mg | SUBLINGUAL_TABLET | SUBLINGUAL | Status: DC | PRN

## 2013-08-29 MED ORDER — RANITIDINE HCL 150 MG PO TABS: 150.0000 mg | ORAL_TABLET | Freq: Two times a day (BID) | ORAL | Status: DC

## 2013-08-29 MED ORDER — ASPIRIN 81 MG PO TABS: 81.0000 mg | ORAL_TABLET | Freq: Every day | ORAL | Status: AC

## 2013-08-29 MED ORDER — ISOSORBIDE MONONITRATE ER 30 MG PO TB24: 30.0000 mg | ORAL_TABLET | Freq: Every day | ORAL | Status: DC

## 2013-08-29 NOTE — Progress Notes (Signed)
Clinical Summary Yesenia Hoffman is a 48 y.o.female last seen by Dr Johnsie Cancel, this is our first visit together. She is seen for the following medical problems.  1. CAD - prior stent in 2004 to LAD, repeat cath 02/2007 with no obstructive disease - Last visit with Dr Johnsie Cancel in 2014 she described some chest pain, referred for stress test.  - GXT 07/2012 68% of THR, 7 METs, stopped due to fatigue. No chest pain. Non-significant ST/T changes - does not appear she ever followed up after the test  - recent admit to [facility] 07/2013 with chest. Pain during that admission showed patent coronaries and stent.   - continues have chest pain at times. Pressure in midchest to left arm. Medium in severity. Mild SOB. Can occur at rest or with exertion. Lasts up 30 minutes. Occurs 2 times per week. Unsure if change in frequency or severity.       Past Medical History  Diagnosis Date  . Type II or unspecified type diabetes mellitus without mention of complication, uncontrolled   . Other and unspecified hyperlipidemia   . Unspecified essential hypertension   . Obesity, unspecified      No Known Allergies   Current Outpatient Prescriptions  Medication Sig Dispense Refill  . amLODipine (NORVASC) 10 MG tablet Take 10 mg by mouth daily.      Marland Kitchen aspirin 325 MG tablet Take 325 mg by mouth daily.      Marland Kitchen atenolol (TENORMIN) 100 MG tablet Take 100 mg by mouth daily.      . fexofenadine (ALLEGRA) 180 MG tablet Take 180 mg by mouth daily.      . fish oil-omega-3 fatty acids 1000 MG capsule Take 1 g by mouth 2 (two) times daily.      Marland Kitchen glipiZIDE (GLUCOTROL) 10 MG tablet Take 10 mg by mouth 2 (two) times daily before a meal.      . ibuprofen (ADVIL,MOTRIN) 200 MG tablet Take 200 mg by mouth 2 (two) times daily as needed for pain.      Marland Kitchen insulin NPH-regular (NOVOLIN 70/30) (70-30) 100 UNIT/ML injection Inject 10 Units into the skin 2 (two) times daily with a meal.      . lisinopril-hydrochlorothiazide  (PRINZIDE,ZESTORETIC) 20-25 MG per tablet Take 1 tablet by mouth daily.      . metFORMIN (GLUCOPHAGE) 1000 MG tablet Take 1,000 mg by mouth 2 (two) times daily with a meal.      . Sennosides (TGT NATURAL LAXATIVE PILLS) 25 MG TABS Take 1 tablet by mouth daily.       No current facility-administered medications for this visit.     No past surgical history on file.   No Known Allergies    No family history on file.   Social History Ms. Scheffler reports that she has never smoked. She does not have any smokeless tobacco history on file. Ms. Sowle reports that she drinks alcohol.   Review of Systems CONSTITUTIONAL: No weight loss, fever, chills, weakness or fatigue.  HEENT: Eyes: No visual loss, blurred vision, double vision or yellow sclerae.No hearing loss, sneezing, congestion, runny nose or sore throat.  SKIN: No rash or itching.  CARDIOVASCULAR: per HPI RESPIRATORY: No shortness of breath, cough or sputum.  GASTROINTESTINAL: No anorexia, nausea, vomiting or diarrhea. No abdominal pain or blood.  GENITOURINARY: No burning on urination, no polyuria NEUROLOGICAL: No headache, dizziness, syncope, paralysis, ataxia, numbness or tingling in the extremities. No change in bowel or bladder control.  MUSCULOSKELETAL: No muscle, back pain, joint pain or stiffness.  LYMPHATICS: No enlarged nodes. No history of splenectomy.  PSYCHIATRIC: No history of depression or anxiety.  ENDOCRINOLOGIC: No reports of sweating, cold or heat intolerance. No polyuria or polydipsia.  Marland Kitchen   Physical Examination p 76 128/81 Wt 159 lbs BMI 31 Gen: resting comfortably, no acute distress HEENT: no scleral icterus, pupils equal round and reactive, no palptable cervical adenopathy,  CV: RRR, no m/r/g, no JVD, no carotid bruits Resp: Clear to auscultation bilaterally GI: abdomen is soft, non-tender, non-distended, normal bowel sounds, no hepatosplenomegaly MSK: extremities are warm, no edema.  Skin: warm, no  rash Neuro:  no focal deficits Psych: appropriate affect   Diagnostic Studies [facility] 07/2013 Cath RESULTS:   Left Main-normal Left Anterior Descending-proximal LAD stent is widely patent, D1 normal Left Circumflex-normal as were the 2 obtuse marginal branches. Right Coronary-large dominant vessel which was normal Normal left ventricular chamber size with normal wall motion the estimated EF is 60%  CONCLUSIONS:  1. Single vessel coronary disease which is nonobstructive 2. LAD stent is widely patent. 3. Normal left ventricular chamber size with normal wall motion, the estimated ejection fraction is 60%.    Labs TC 117 HDL 24 TG 276 LDL 38 Na 136 K 4.2 Cr 0.3   Assessment and Plan   1. CAD/Chest pain - recent cath at [facility] with patent coronaries and stent - continues to have chest pain, unclear etiology - will start emperic H2 blocker. Start imdur as additional antianginal for potential vasospasm   - f/u 4 months     Arnoldo Lenis, M.D., F.A.C.C.

## 2013-08-29 NOTE — Patient Instructions (Signed)
   Decrease Aspirin to 81mg  daily - OTC  Begin Imdur 30mg  daily - new sent to pharm  Nitroglycerin as needed for severe chest pain - new sent to pharm  Zantac 150mg  twice a day  - OTC Continue all other medications.   Follow up in  4 months

## 2013-09-06 ENCOUNTER — Telehealth: Payer: Self-pay | Admitting: *Deleted

## 2013-09-06 NOTE — Telephone Encounter (Signed)
Saw Dr. Harl Bowie on 08/29/2013 - was started on Imdur 30mg  daily.  Husband [facility]) states she is now having really bad headache.    Advised him to have her cut dose in half for now & message will be sent to provider for any further advice.  He verbalized understanding.

## 2013-09-06 NOTE — Telephone Encounter (Signed)
Agree, try half dose and see how symptoms are. If continues, ok to stop   Carlyle Dolly MD

## 2013-09-06 NOTE — Telephone Encounter (Signed)
Patient informed. 

## 2013-10-10 ENCOUNTER — Encounter: Payer: Self-pay | Admitting: Cardiology

## 2014-01-16 ENCOUNTER — Other Ambulatory Visit (HOSPITAL_COMMUNITY): Payer: Self-pay | Admitting: Physician Assistant

## 2014-01-16 DIAGNOSIS — Z1231 Encounter for screening mammogram for malignant neoplasm of breast: Secondary | ICD-10-CM

## 2014-01-26 ENCOUNTER — Ambulatory Visit (HOSPITAL_COMMUNITY)
Admission: RE | Admit: 2014-01-26 | Discharge: 2014-01-26 | Disposition: A | Discharge status: Home / Self Care | Payer: Self-pay | Source: Ambulatory Visit | Attending: Physician Assistant | Admitting: Physician Assistant

## 2014-01-26 DIAGNOSIS — Z1231 Encounter for screening mammogram for malignant neoplasm of breast: Secondary | ICD-10-CM

## 2014-02-02 ENCOUNTER — Ambulatory Visit (INDEPENDENT_AMBULATORY_CARE_PROVIDER_SITE_OTHER): Payer: Self-pay | Admitting: Cardiology

## 2014-02-02 ENCOUNTER — Encounter: Payer: Self-pay | Admitting: Cardiology

## 2014-02-02 VITALS — BP 123/75 | HR 64 | Wt 152.1 lb

## 2014-02-02 DIAGNOSIS — I2510 Atherosclerotic heart disease of native coronary artery without angina pectoris: Secondary | ICD-10-CM

## 2014-02-02 DIAGNOSIS — R0789 Other chest pain: Secondary | ICD-10-CM

## 2014-02-02 MED ORDER — ISOSORBIDE MONONITRATE ER 30 MG PO TB24: 15.0000 mg | ORAL_TABLET | Freq: Every day | ORAL | Status: DC

## 2014-02-02 NOTE — Patient Instructions (Signed)
   Decrease Imdur to 15mg  daily. New sent to pharmacy. Continue all other medications.   Your physician wants you to follow up in: 6 months.  You will receive a reminder letter in the mail one-two months in advance.  If you don't receive a letter, please call our office to schedule the follow up appointment.

## 2014-02-02 NOTE — Progress Notes (Signed)
Clinical Summary Ms. Birkhead is a 48 y.o.female seen today for follow up of the following medical problems. She is spanish speaking, she is seen with assistance of spanish medical interpretor.   1. CAD  - prior stent in 2004 to LAD, repeat cath 02/2007 with no obstructive disease  - Last visit with Dr Johnsie Cancel in 2014 she described some chest pain, referred for stress test.  - GXT 07/2012 68% of THR, 7 METs, stopped due to fatigue. No chest pain. Non-significant ST/T changes  - does not appear she ever followed up after the test  - recent admit to [facility] 07/2013 with chest. Pain during that admission cath showed patent coronaries and stent.  - last visit started H2 blocker emperically for possible GERD, started imdur as addiational antianginal. Imdur caused headaches, decreased dose to 15mg  daily. She is actually only taking imdur prn pain as opposed to daily.  - since last visit has had only very light chest pain. Denies any significant SOB. -  Past Medical History  Diagnosis Date  . Type II or unspecified type diabetes mellitus without mention of complication, uncontrolled   . Other and unspecified hyperlipidemia   . Unspecified essential hypertension   . Obesity, unspecified      No Known Allergies   Current Outpatient Prescriptions  Medication Sig Dispense Refill  . amLODipine (NORVASC) 10 MG tablet Take 10 mg by mouth daily.      Marland Kitchen aspirin 81 MG tablet Take 1 tablet (81 mg total) by mouth daily.      Marland Kitchen atenolol (TENORMIN) 100 MG tablet Take 100 mg by mouth daily.      . fexofenadine (ALLEGRA) 180 MG tablet Take 180 mg by mouth daily.      . fish oil-omega-3 fatty acids 1000 MG capsule Take 1 g by mouth 2 (two) times daily.      Marland Kitchen glipiZIDE (GLUCOTROL) 10 MG tablet Take 10 mg by mouth 2 (two) times daily before a meal.      . ibuprofen (ADVIL,MOTRIN) 200 MG tablet Take 200 mg by mouth 2 (two) times daily as needed for pain.      Marland Kitchen insulin NPH-regular (NOVOLIN 70/30)  (70-30) 100 UNIT/ML injection Inject 10 Units into the skin 2 (two) times daily with a meal.      . isosorbide mononitrate (IMDUR) 30 MG 24 hr tablet Take 1 tablet (30 mg total) by mouth daily.  30 tablet  6  . lisinopril-hydrochlorothiazide (PRINZIDE,ZESTORETIC) 20-25 MG per tablet Take 1 tablet by mouth daily.      . metFORMIN (GLUCOPHAGE) 1000 MG tablet Take 1,000 mg by mouth 2 (two) times daily with a meal.      . nitroGLYCERIN (NITROSTAT) 0.4 MG SL tablet Place 1 tablet (0.4 mg total) under the tongue every 5 (five) minutes as needed for chest pain.  25 tablet  3  . ranitidine (ZANTAC) 150 MG tablet Take 1 tablet (150 mg total) by mouth 2 (two) times daily.      . Sennosides (TGT NATURAL LAXATIVE PILLS) 25 MG TABS Take 1 tablet by mouth daily.       No current facility-administered medications for this visit.     No past surgical history on file.   No Known Allergies    No family history on file.   Social History Ms. Wohlford reports that she has never smoked. She has never used smokeless tobacco. Ms. Bollman reports that she drinks alcohol.   Review of Systems  CONSTITUTIONAL: No weight loss, fever, chills, weakness or fatigue.  HEENT: Eyes: No visual loss, blurred vision, double vision or yellow sclerae.No hearing loss, sneezing, congestion, runny nose or sore throat.  SKIN: No rash or itching.  CARDIOVASCULAR: per HPI RESPIRATORY: No shortness of breath, cough or sputum.  GASTROINTESTINAL: No anorexia, nausea, vomiting or diarrhea. No abdominal pain or blood.  GENITOURINARY: No burning on urination, no polyuria NEUROLOGICAL: No headache, dizziness, syncope, paralysis, ataxia, numbness or tingling in the extremities. No change in bowel or bladder control.  MUSCULOSKELETAL: No muscle, back pain, joint pain or stiffness.  LYMPHATICS: No enlarged nodes. No history of splenectomy.  PSYCHIATRIC: No history of depression or anxiety.  ENDOCRINOLOGIC: No reports of sweating, cold  or heat intolerance. No polyuria or polydipsia.  Marland Kitchen   Physical Examination p 64 bp 123/75 Wt 152 lbs Gen: resting comfortably, no acute distress HEENT: no scleral icterus, pupils equal round and reactive, no palptable cervical adenopathy,  CV: RRR, no m/r/g, no JVD, no carotid bruits Resp: Clear to auscultation bilaterally GI: abdomen is soft, non-tender, non-distended, normal bowel sounds, no hepatosplenomegaly MSK: extremities are warm, no edema.  Skin: warm, no rash Neuro:  no focal deficits Psych: appropriate affect   Diagnostic Studies  [facility] 07/2013 Cath  RESULTS:   Left Main-normal Left Anterior Descending-proximal LAD stent is widely patent, D1 normal Left Circumflex-normal as were the 2 obtuse marginal branches. Right Coronary-large dominant vessel which was normal Normal left ventricular chamber size with normal wall motion the estimated EF is 60%  CONCLUSIONS:  1. Single vessel coronary disease which is nonobstructive 2. LAD stent is widely patent. 3. Normal left ventricular chamber size with normal wall motion, the estimated ejection fraction is 60%.       Assessment and Plan  1. CAD/Chest pain  - recent cath at [facility] with patent coronaries and stent  - still with occasional chest pain improved since starting antacid last visit - continue current meds, encouraged to take imdur daily as opposed to prn as additional antianginal. Decreased her dose to 15mg  daily due to headaches.    F/u 6 months    Arnoldo Lenis, M.D.

## 2014-08-10 ENCOUNTER — Encounter: Payer: Self-pay | Admitting: Cardiology

## 2014-08-10 ENCOUNTER — Ambulatory Visit (INDEPENDENT_AMBULATORY_CARE_PROVIDER_SITE_OTHER): Payer: Self-pay | Admitting: Cardiology

## 2014-08-10 ENCOUNTER — Encounter: Payer: Self-pay | Admitting: *Deleted

## 2014-08-10 VITALS — BP 134/83 | HR 77 | Ht 60.0 in | Wt 158.0 lb

## 2014-08-10 DIAGNOSIS — E785 Hyperlipidemia, unspecified: Secondary | ICD-10-CM

## 2014-08-10 DIAGNOSIS — I10 Essential (primary) hypertension: Secondary | ICD-10-CM

## 2014-08-10 DIAGNOSIS — I2510 Atherosclerotic heart disease of native coronary artery without angina pectoris: Secondary | ICD-10-CM

## 2014-08-10 NOTE — Progress Notes (Signed)
Clinical Summary Ms. Deutschman is a 49 y.o.female seen today for follow up of the following medical problems. She is spanish speaking and seen with the assistance of a medical interpreter   1. CAD  - prior stent in 2004 to LAD, repeat cath 02/2007 with no obstructive disease  - Last visit with Dr Johnsie Cancel in 2014 she described some chest pain, referred for stress test.  - GXT 07/2012 68% of THR, 7 METs, stopped due to fatigue. No chest pain. Non-significant ST/T changes  - admit to [facility] 07/2013 with chest. Pain during that admission cath showed patent coronaries and stent.  - last visit started H2 blocker emperically for possible GERD, started imdur as addiational antianginal. Imdur caused headaches, decreased dose to 15mg  daily. She is actually only taking imdur prn pain as opposed to daily.   - denies any chest pain since last visit. No significant SOB or DOE - compliant with meds, though cost can be an issue  2. Left arm pain - left arm and shoulder pain wor worst with movement. She is awaiting eval by her pcp  Past Medical History  Diagnosis Date  . Type II or unspecified type diabetes mellitus without mention of complication, uncontrolled   . Other and unspecified hyperlipidemia   . Unspecified essential hypertension   . Obesity, unspecified      No Known Allergies   Current Outpatient Prescriptions  Medication Sig Dispense Refill  . acetaminophen (TYLENOL) 500 MG tablet Take 500 mg by mouth as needed.    Marland Kitchen amLODipine (NORVASC) 10 MG tablet Take 10 mg by mouth daily.    Marland Kitchen aspirin 81 MG tablet Take 1 tablet (81 mg total) by mouth daily.    Marland Kitchen atenolol (TENORMIN) 100 MG tablet Take 100 mg by mouth daily.    . fexofenadine (ALLEGRA) 180 MG tablet Take 180 mg by mouth daily.    . fish oil-omega-3 fatty acids 1000 MG capsule Take 2 g by mouth 2 (two) times daily.     Marland Kitchen glipiZIDE (GLUCOTROL) 10 MG tablet Take 10 mg by mouth 2 (two) times daily before a meal.    .  insulin NPH-regular (NOVOLIN 70/30) (70-30) 100 UNIT/ML injection Inject 16 Units into the skin 2 (two) times daily with a meal.     . isosorbide mononitrate (IMDUR) 30 MG 24 hr tablet Take 0.5 tablets (15 mg total) by mouth daily. 15 tablet 6  . lisinopril-hydrochlorothiazide (PRINZIDE,ZESTORETIC) 20-25 MG per tablet Take 1 tablet by mouth daily.    . metFORMIN (GLUCOPHAGE) 1000 MG tablet Take 1,000 mg by mouth 2 (two) times daily with a meal.    . nitroGLYCERIN (NITROSTAT) 0.4 MG SL tablet Place 1 tablet (0.4 mg total) under the tongue every 5 (five) minutes as needed for chest pain. 25 tablet 3  . ranitidine (ZANTAC) 150 MG tablet Take 150 mg by mouth daily.    . Sennosides (TGT NATURAL LAXATIVE PILLS) 25 MG TABS Take 1 tablet by mouth daily.    . simvastatin (ZOCOR) 20 MG tablet Take 20 mg by mouth daily.     No current facility-administered medications for this visit.     No past surgical history on file.   No Known Allergies    No family history on file.   Social History Ms. Kruger reports that she has never smoked. She has never used smokeless tobacco. Ms. Blouch reports that she drinks alcohol.   Review of Systems CONSTITUTIONAL: No weight loss, fever, chills,  weakness or fatigue.  HEENT: Eyes: No visual loss, blurred vision, double vision or yellow sclerae.No hearing loss, sneezing, congestion, runny nose or sore throat.  SKIN: No rash or itching.  CARDIOVASCULAR: per HPI RESPIRATORY: No shortness of breath, cough or sputum.  GASTROINTESTINAL: No anorexia, nausea, vomiting or diarrhea. No abdominal pain or blood.  GENITOURINARY: No burning on urination, no polyuria NEUROLOGICAL: No headache, dizziness, syncope, paralysis, ataxia, numbness or tingling in the extremities. No change in bowel or bladder control.  MUSCULOSKELETAL: No muscle, back pain, joint pain or stiffness.  LYMPHATICS: No enlarged nodes. No history of splenectomy.  PSYCHIATRIC: No history of  depression or anxiety.  ENDOCRINOLOGIC: No reports of sweating, cold or heat intolerance. No polyuria or polydipsia.  Marland Kitchen   Physical Examination p 77 bp 134/83 Wt 158 lbs BMI 31 Gen: resting comfortably, no acute distress HEENT: no scleral icterus, pupils equal round and reactive, no palptable cervical adenopathy,  CV: RRR, no m/r/g, no JVD, no carotid bruits Resp: Clear to auscultation bilaterally GI: abdomen is soft, non-tender, non-distended, normal bowel sounds, no hepatosplenomegaly MSK: extremities are warm, no edema.  Skin: warm, no rash Neuro:  no focal deficits Psych: appropriate affect   Diagnostic Studies [facility] 07/2013 Cath  RESULTS:   Left Main-normal Left Anterior Descending-proximal LAD stent is widely patent, D1 normal Left Circumflex-normal as were the 2 obtuse marginal branches. Right Coronary-large dominant vessel which was normal Normal left ventricular chamber size with normal wall motion the estimated EF is 60%  CONCLUSIONS:  1. Single vessel coronary disease which is nonobstructive 2. LAD stent is widely patent. 3. Normal left ventricular chamber size with normal wall motion, the estimated ejection fraction is 60%.     Assessment and Plan   1. CAD/Chest pain  - 07/2013 [facility] with patent coronaries and stent  - no current symptoms - continue current meds. Cost is issue for her meds, will keep on current statin. LDL at goal at last check.   2. HTN - at goal, continue current meds  3. HL  Cost is issue for her meds, will keep on current statin. LDL at goal at last check.  Arnoldo Lenis, M.D.

## 2014-08-10 NOTE — Patient Instructions (Signed)
Your physician wants you to follow-up in: [place] DR. BRANCH You will receive a reminder letter in the mail two months in advance. If you don't receive a letter, please call our office to schedule the follow-up appointment.  Your physician recommends that you continue on your current medications as directed. Please refer to the Current Medication list given to you today.  WE WILL REQUEST RECORDS FROM DR. MCELROY  Thank you for choosing [facility]!!

## 2014-08-17 ENCOUNTER — Other Ambulatory Visit (HOSPITAL_COMMUNITY): Payer: Self-pay | Admitting: Physician Assistant

## 2014-08-17 DIAGNOSIS — M25512 Pain in left shoulder: Secondary | ICD-10-CM

## 2014-08-24 ENCOUNTER — Ambulatory Visit (HOSPITAL_COMMUNITY)
Admission: RE | Admit: 2014-08-24 | Discharge: 2014-08-24 | Disposition: A | Discharge status: Home / Self Care | Payer: Self-pay | Source: Ambulatory Visit | Attending: Physician Assistant | Admitting: Physician Assistant

## 2014-08-24 DIAGNOSIS — M75112 Incomplete rotator cuff tear or rupture of left shoulder, not specified as traumatic: Secondary | ICD-10-CM | POA: Insufficient documentation

## 2014-08-24 DIAGNOSIS — M25512 Pain in left shoulder: Secondary | ICD-10-CM

## 2014-08-24 DIAGNOSIS — M65812 Other synovitis and tenosynovitis, left shoulder: Secondary | ICD-10-CM | POA: Insufficient documentation

## 2014-10-04 ENCOUNTER — Encounter: Payer: Self-pay | Admitting: Orthopedic Surgery

## 2014-10-04 ENCOUNTER — Ambulatory Visit (INDEPENDENT_AMBULATORY_CARE_PROVIDER_SITE_OTHER): Payer: Self-pay

## 2014-10-04 ENCOUNTER — Ambulatory Visit (INDEPENDENT_AMBULATORY_CARE_PROVIDER_SITE_OTHER): Payer: Self-pay | Admitting: Orthopedic Surgery

## 2014-10-04 VITALS — BP 145/86 | Ht 63.0 in | Wt 157.0 lb

## 2014-10-04 DIAGNOSIS — M25512 Pain in left shoulder: Secondary | ICD-10-CM

## 2014-10-04 DIAGNOSIS — M7502 Adhesive capsulitis of left shoulder: Secondary | ICD-10-CM

## 2014-10-04 DIAGNOSIS — M19019 Primary osteoarthritis, unspecified shoulder: Secondary | ICD-10-CM

## 2014-10-04 DIAGNOSIS — M129 Arthropathy, unspecified: Secondary | ICD-10-CM

## 2014-10-04 MED ORDER — NAPROXEN 500 MG PO TABS: 500.0000 mg | ORAL_TABLET | Freq: Two times a day (BID) | ORAL | Status: DC

## 2014-10-04 MED ORDER — HYDROCODONE-ACETAMINOPHEN 5-325 MG PO TABS: 1.0000 | ORAL_TABLET | Freq: Four times a day (QID) | ORAL | Status: DC | PRN

## 2014-10-04 MED ORDER — CYCLOBENZAPRINE HCL 5 MG PO TABS: 5.0000 mg | ORAL_TABLET | Freq: Three times a day (TID) | ORAL | Status: DC

## 2014-10-04 NOTE — Progress Notes (Signed)
Patient ID: Yesenia Hoffman, female   DOB: 11/16/1965, 49 y.o.   MRN: [id number]  Chief Complaint  Patient presents with  . Shoulder Pain    left shoulder pain    HPI Yesenia Hoffman is a 49 y.o. female.  She presents with one year of pain in her left shoulder associated with swelling stiffness loss of motion. Her pain is rated as a 10 out of 10 and she rates it as constant she denies trauma. She got a pain shot at the emergency room and she was put on naproxen 500 twice a day and she has not gotten relief. She has not had physical therapy. No x-ray was done but she had an MRI and that MRI has been reviewed with its corresponding report but my independent interpretation of it is that she has mild before meals joint arthritis she has mild glenohumeral arthritis she has partial tear of the supraspinatus on the articular side and she has supraspinatus and subscapularis tendinitis with partial tear of the intra-articular portion of the biceps tendon along with bursitis. We should also note that there is a joint effusion and posterior subluxation of the humeral head without history of dislocation she reports her review of systems and the pertinent findings are she does not have a history of fever or infection she has numbness in her hands and occasional radiation of the pain into her left thumb and index finger  Other unrelated systems findings include shortness of breath cough breathing issues wheezing irregular heartbeat ankle leg edema night sweats fatigue depression anxiety frequent and excessive nighttime urination loss of bladder control seasonal allergy skin changes in color back pain muscle weakness.  Review of Systems Review of Systems 1. See above  No Known Allergies  Current Outpatient Prescriptions  Medication Sig Dispense Refill  . acetaminophen (TYLENOL) 500 MG tablet Take 500 mg by mouth as needed.    Marland Kitchen amLODipine (NORVASC) 10 MG tablet Take 10 mg by mouth daily.    Marland Kitchen aspirin 81 MG  tablet Take 1 tablet (81 mg total) by mouth daily.    Marland Kitchen atenolol (TENORMIN) 100 MG tablet Take 100 mg by mouth daily.    . cyclobenzaprine (FLEXERIL) 5 MG tablet Take 1 tablet (5 mg total) by mouth every 8 (eight) hours. 90 tablet 2  . fexofenadine (ALLEGRA) 180 MG tablet Take 180 mg by mouth daily.    . fish oil-omega-3 fatty acids 1000 MG capsule Take 2 g by mouth 2 (two) times daily.     Marland Kitchen glipiZIDE (GLUCOTROL) 10 MG tablet Take 10 mg by mouth 2 (two) times daily before a meal.    . HYDROcodone-acetaminophen (NORCO/VICODIN) 5-325 MG per tablet Take 1 tablet by mouth every 6 (six) hours as needed for moderate pain. 120 tablet 0  . insulin NPH-regular (NOVOLIN 70/30) (70-30) 100 UNIT/ML injection Inject 16 Units into the skin 2 (two) times daily with a meal.     . lisinopril-hydrochlorothiazide (PRINZIDE,ZESTORETIC) 20-25 MG per tablet Take 1 tablet by mouth daily.    . meloxicam (MOBIC) 15 MG tablet Take 15 mg by mouth daily as needed for pain.    . metFORMIN (GLUCOPHAGE) 1000 MG tablet Take 1,000 mg by mouth 2 (two) times daily with a meal.    . naproxen (NAPROSYN) 500 MG tablet Take 1 tablet (500 mg total) by mouth every 12 (twelve) hours. 60 tablet 2  . nitroGLYCERIN (NITROSTAT) 0.4 MG SL tablet Place 1 tablet (0.4 mg total) under the tongue  every 5 (five) minutes as needed for chest pain. 25 tablet 3  . ranitidine (ZANTAC) 150 MG tablet Take 150 mg by mouth daily.    . Sennosides (TGT NATURAL LAXATIVE PILLS) 25 MG TABS Take 1 tablet by mouth daily.    . simvastatin (ZOCOR) 20 MG tablet Take 20 mg by mouth daily.     No current facility-administered medications for this visit.      Physical Exam Physical Exam Blood pressure 145/86, height 5\' 3"  (1.6 m), weight 157 lb (71.215 kg).  Gen. appearance her appearance is normal her body habitus is mesomorphic The patient is alert and oriented person place and time  Exam of the she has tenderness in the posterior scapular medial border and  no tenderness around the shoulder but she has loss of external rotation as she only rotates to neutral position her for elevation actively is 90 abduction 90 and passive range of motion is limited at that level as well no instability on inferior subluxation we could not test abduction external rotation. She does not appear to have weakness in the rotator cuff the skin around the shoulder is normal she has good distal pulses and she has no lymphadenopathy. The sensory exam is normal the pulses are good lymph nodes are negative the opposite shoulder has full range of motion and no instability and no tenderness   Data Reviewed See hpi   Assessment    Encounter Diagnoses  Name Primary?  . Left shoulder pain   . Adhesive capsulitis of left shoulder Yes  . Arthritis, shoulder region   . Acromioclavicular joint arthritis         Plan    She has quite a significant medical history and I think nonoperative treatment is warranted at least initially. I injected her shoulder and subacromial space and Center for occupational therapy for range of motion to address adhesive capsulitis. She is on naproxen. Flexeril. And hydrocodone. Follow-up 6 weeks.       Arther Abbott 10/04/2014, 10:27 AM    Procedure note the subacromial injection shoulder left   Verbal consent was obtained to inject the  Left   Shoulder  Timeout was completed to confirm the injection site is a subacromial space of the  left  shoulder  Medication used Depo-Medrol 40 mg and lidocaine 1% 3 cc  Anesthesia was provided by ethyl chloride  The injection was performed in the left  posterior subacromial space. After pinning the skin with alcohol and anesthetized the skin with ethyl chloride the subacromial space was injected using a 20-gauge needle. There were no complications  Sterile dressing was applied.

## 2014-10-04 NOTE — Patient Instructions (Signed)
Call [facility] therapy dept to schedule therapy visits  Three prescriptions  Joint Injection Care After Refer to this sheet in the next few days. These instructions provide you with information on caring for yourself after you have had a joint injection. Your caregiver also may give you more specific instructions. Your treatment has been planned according to current medical practices, but problems sometimes occur. Call your caregiver if you have any problems or questions after your procedure. After any type of joint injection, it is not uncommon to experience:  Soreness, swelling, or bruising around the injection site.  Mild numbness, tingling, or weakness around the injection site caused by the numbing medicine used before or with the injection. It also is possible to experience the following effects associated with the specific agent after injection:  Iodine-based contrast agents:  Allergic reaction (itching, hives, widespread redness, and swelling beyond the injection site).  Corticosteroids (These effects are rare.):  Allergic reaction.  Increased blood sugar levels (If you have diabetes and you notice that your blood sugar levels have increased, notify your caregiver).  Increased blood pressure levels.  Mood swings.  Hyaluronic acid in the use of viscosupplementation.  Temporary heat or redness.  Temporary rash and itching.  Increased fluid accumulation in the injected joint. These effects all should resolve within a day after your procedure.  HOME CARE INSTRUCTIONS  Limit yourself to light activity the day of your procedure. Avoid lifting heavy objects, bending, stooping, or twisting.  Take prescription or over-the-counter pain medication as directed by your caregiver.  You may apply ice to your injection site to reduce pain and swelling the day of your procedure. Ice may be applied 03-04 times:  Put ice in a plastic bag.  Place a towel between your skin and the  bag.  Leave the ice on for no longer than 15-20 minutes each time. SEEK IMMEDIATE MEDICAL CARE IF:   Pain and swelling get worse rather than better or extend beyond the injection site.  Numbness does not go away.  Blood or fluid continues to leak from the injection site.  You have chest pain.  You have swelling of your face or tongue.  You have trouble breathing or you become dizzy.  You develop a fever, chills, or severe tenderness at the injection site that last longer than 1 day. MAKE SURE YOU:  Understand these instructions.  Watch your condition.  Get help right away if you are not doing well or if you get worse. Document Released: 12/26/2010 Document Revised: 07/07/2011 Document Reviewed: 12/26/2010 [facility] Patient Information 2015 [place], [place]. This information is not intended to replace advice given to you by your health care provider. Make sure you discuss any questions you have with your health care provider.

## 2014-10-12 ENCOUNTER — Ambulatory Visit (HOSPITAL_COMMUNITY): Payer: No Typology Code available for payment source | Attending: Orthopedic Surgery | Admitting: Occupational Therapy

## 2014-10-12 DIAGNOSIS — R531 Weakness: Secondary | ICD-10-CM | POA: Insufficient documentation

## 2014-10-12 DIAGNOSIS — M25512 Pain in left shoulder: Secondary | ICD-10-CM | POA: Diagnosis present

## 2014-10-12 DIAGNOSIS — M629 Disorder of muscle, unspecified: Secondary | ICD-10-CM | POA: Insufficient documentation

## 2014-10-12 DIAGNOSIS — M25812 Other specified joint disorders, left shoulder: Secondary | ICD-10-CM | POA: Diagnosis not present

## 2014-10-12 DIAGNOSIS — M7502 Adhesive capsulitis of left shoulder: Secondary | ICD-10-CM

## 2014-10-12 DIAGNOSIS — M6289 Other specified disorders of muscle: Secondary | ICD-10-CM

## 2014-10-12 DIAGNOSIS — M25612 Stiffness of left shoulder, not elsewhere classified: Secondary | ICD-10-CM

## 2014-10-12 NOTE — Therapy (Signed)
[facility] [place], [place], [address] Phone: [phone]   Fax:  [phone]  Occupational Therapy Evaluation  Patient Details  Name: Yesenia Hoffman MRN: [id number] Date of Birth: April 28, 1966 Referring Provider:  Carole Civil, MD  Encounter Date: 10/12/2014      OT End of Session - 10/12/14 1244    Visit Number 1   Number of Visits 12   Date for OT Re-Evaluation 12/11/14  Mini reassess 11/09/2014   Authorization Type None   OT Start Time 1016   OT Stop Time 1059   OT Time Calculation (min) 43 min   Activity Tolerance Patient tolerated treatment well   Behavior During Therapy [facility] for tasks assessed/performed      Past Medical History  Diagnosis Date  . Type II or unspecified type diabetes mellitus without mention of complication, uncontrolled   . Other and unspecified hyperlipidemia   . Unspecified essential hypertension   . Obesity, unspecified     No past surgical history on file.  There were no vitals filed for this visit.  Visit Diagnosis:  Adhesive capsulitis of left shoulder  Pain in left shoulder  Tight fascia  Decreased range of motion of left shoulder  Decreased strength      Subjective Assessment - 10/12/14 1237    Subjective  S: I haven't been able to work for 6 months because of my shoulder.    Patient is accompained by: Interpreter   Pertinent History Pt is a 49 y/o female presenting with adhesive capulitis of the left shoulder causing her pain and limiting her ability to complete daily activities and work tasks. Pt is accompanied by an interpreter. Pt does not have insurance however is willing to pay for visits. Dr. Aline Brochure referred pt to occupational therapy for evaluation and treatment.    Special Tests FOTO Score: 53/100 (47% impairment)   Patient Stated Goals To have no pain in my arm   Currently in Pain? Yes   Pain Score 6    Pain Location Shoulder   Pain Orientation Left    Pain Descriptors / Indicators Aching   Pain Type Acute pain           [facility] OT Assessment - 10/12/14 1020    Assessment   Diagnosis adhesive capsuliltis of left shoulder   Onset Date --  1 year ago   Prior Therapy None   Precautions   Precautions None   Balance Screen   Has the patient fallen in the past 6 months No   Has the patient had a decrease in activity level because of a fear of falling?  No   Is the patient reluctant to leave their home because of a fear of falling?  No   Home  Environment   Family/patient expects to be discharged to: Private residence   Living Arrangements Spouse/significant other   Available Help at Discharge Family   Prior Function   Level of [place] with basic ADLs   Vocation Full time employment  6 months since worked due to shoulder pain   Vocation Requirements Packer-packs boxes, reaching for and lifting items.    Leisure exercising   ADL   ADL comments Pt is having difficulty with dressing tasks, bathing tasks, grooming tasks, reaching into high cabinets and lifting items.    Written Expression   Dominant Hand Right   Vision - History   Baseline Vision Wears glasses all the time   Cognition  Overall Cognitive Status Within Functional Limits for tasks assessed   ROM / Strength   AROM / PROM / Strength AROM;PROM;Strength   Palpation   Palpation comment Max fascial restrictions in left upper arm, trapezius, and scapularis regions.    AROM   Overall AROM Comments Assessed seated, ER/IR adducted   AROM Assessment Site Shoulder   Right/Left Shoulder Left   Left Shoulder Flexion 101 Degrees   Left Shoulder ABduction 100 Degrees   Left Shoulder Internal Rotation 90 Degrees   Left Shoulder External Rotation 18 Degrees   PROM   Overall PROM Comments Assessed in supine, ER/IR adducted   PROM Assessment Site Shoulder   Right/Left Shoulder Left   Left Shoulder Flexion 115 Degrees   Left Shoulder ABduction 79 Degrees    Left Shoulder Internal Rotation 90 Degrees   Left Shoulder External Rotation 16 Degrees   Strength   Overall Strength Unable to assess   Strength Assessment Site --   Right/Left Shoulder --            OT Education - 10/12/14 1243    Education provided Yes   Education Details table slides   Person(s) Educated Patient   Methods Explanation;Demonstration;Handout   Comprehension Verbalized understanding;Returned demonstration          OT Short Term Goals - 10/12/14 1249    OT SHORT TERM GOAL #1   Title Pt will be educated on HEP.    Time 3   Period Weeks   Status New   OT SHORT TERM GOAL #2   Title Pt will decrease fascial restrictions from max to mod amounts.    Time 3   Period Weeks   Status New   OT SHORT TERM GOAL #3   Title Pt will decrease pain to 4/10 or less during daily tasks.    Time 3   Period Weeks   Status New   OT SHORT TERM GOAL #4   Title Pt will increase AROM to [facility] to increase ability to donn shirts.    Time 3   Period Weeks   Status New   OT SHORT TERM GOAL #5   Title Pt will increase strength to 3+/5 to increase ability fix hair.    Time 3   Period Weeks   Status New           OT Long Term Goals - 10/12/14 1251    OT LONG TERM GOAL #1   Title Pt will return to highest level of functioning and independence in daily tasks.    Time 6   Period Weeks   Status New   OT LONG TERM GOAL #2   Title Pt will decrease fascial restrictions from mod to min amounts or less.    Time 6   Period Weeks   Status New   OT LONG TERM GOAL #3   Title Pt will decrease pain to 2/10 or less during daily tasks.    Time 6   Period Weeks   Status New   OT LONG TERM GOAL #4   Title Pt will increase AROM to WNL to increase ability to reach into overhead cabinets.    Time 6   Period Weeks   Status New   OT LONG TERM GOAL #5   Title Pt will increase strength 4+/5 to increase ability to complete work tasks.    Time 6   Period Weeks   Status New  Plan - 10/12/14 1244    Clinical Impression Statement A: Pt is a 49 y/o female presenting with adhesive capsulitis of left shoulder causing increased pain and fascial restrictions, decreased range of motion and strength, and is limiting her ability to complete daily tasks and work tasks. Pt reports her right arm is now getting tired easily because she is trying to use it to compensate for the left arm. Discussed payment options with pt and she is willing to come in 2x/week for 6 weeks and pay out of pocket. Provided pt with and educated pt on table slides HEP.    Pt will benefit from skilled therapeutic intervention in order to improve on the following deficits (Retired) Decreased strength;Pain;Decreased activity tolerance;Impaired UE functional use;Increased fascial restricitons;Decreased range of motion;Impaired flexibility   Rehab Potential Good   OT Frequency 2x / week   OT Duration 6 weeks   OT Treatment/Interventions Self-care/ADL training;Passive range of motion;Patient/family education;Cryotherapy;Electrical Stimulation;Contrast Bath;Therapeutic exercises;Manual Therapy;Moist Heat;Therapeutic activities   Plan P: Pt will benefit from skilled occupational therapy servcies to decrease pain and fascial restrictions, increase range of motion and strength, and increase overall use of LUE. Treatment plan: Myofascial release, manual therapy, PROM, AAROM, AROM, general LUE strengthening.    OT Home Exercise Plan Table slides    Consulted and Agree with Plan of Care Patient        Problem List Patient Active Problem List   Diagnosis Date Noted  . Chest pain 08/06/2012  . Diabetes mellitus 08/06/2012  . HTN (hypertension) 08/06/2012    Guadelupe Sabin, OTR/L  [phone]  10/12/2014, 12:54 PM  [facility] [address] [place], [place], [address] Phone: [phone]   Fax:  [phone]

## 2014-10-12 NOTE — Patient Instructions (Signed)
SHOULDER: Flexion On Table   Place hands on table, elbows straight. Move hips away from body. Press hands down into table.  _10__ reps per set, _2__ sets per day  Abduction (Passive)   With arm out to side, resting on table, lower head toward arm, keeping trunk away from table.  Repeat __10__ times. Do __2__ sessions per day.  Copyright  VHI. All rights reserved.     Internal Rotation (Assistive)   Seated with elbow bent at right angle and held against side, slide arm on table surface in an inward arc. Repeat _10___ times. Do __2__ sessions per day. Activity: Use this motion to brush crumbs off the table.  Copyright  VHI. All rights reserved.    

## 2014-10-17 ENCOUNTER — Encounter (HOSPITAL_COMMUNITY): Payer: Self-pay

## 2014-10-17 ENCOUNTER — Ambulatory Visit (HOSPITAL_COMMUNITY): Payer: No Typology Code available for payment source

## 2014-10-17 DIAGNOSIS — M6289 Other specified disorders of muscle: Secondary | ICD-10-CM

## 2014-10-17 DIAGNOSIS — M25512 Pain in left shoulder: Secondary | ICD-10-CM

## 2014-10-17 DIAGNOSIS — R531 Weakness: Secondary | ICD-10-CM

## 2014-10-17 DIAGNOSIS — M25612 Stiffness of left shoulder, not elsewhere classified: Secondary | ICD-10-CM

## 2014-10-17 DIAGNOSIS — M629 Disorder of muscle, unspecified: Secondary | ICD-10-CM

## 2014-10-17 NOTE — Patient Instructions (Signed)
Perform each exercise ____10____ reps. 2-3x days.   WAND PRESS - STANDING  Start by holding a wand or cane at chest height.  Next, slowly push the wand outwards in front of your body so that your elbows become fully straightened. Then, return to the original position.     WAND FLEXION - STANDING - PALMS UP  In the standing position, hold a wand/cane with both arms, palms up on both sides. Raise up the wand/cane allowing your unaffected arm to perform most of the effort. Your affected arm should be partially relaxed.      WAND ROTATION - STANDING  In the standing position, hold a wand/cane with both hands keeping your elbows bent. Move your arms and wand/cane to one side.  Your affected arm should be partially relaxed while your unaffected arm performs most of the effort.       WAND ABDUCTION - STANDING  While holding a wand/cane palm face up on the injured side and palm face down on the uninjured side, slowly raise up your injured arm to the side.   Horizontal Abduction/Adduction               Cane Horizontal - Standing   Straight arms holding cane at shoulder height, bring cane to right, center, left. Repeat starting to left.   Copyright  VHI. All rights reserved.

## 2014-10-17 NOTE — Therapy (Signed)
[place], [place], [address] Phone: [phone]   Fax:  [phone]  Occupational Therapy Treatment  Patient Details  Name: Yesenia Hoffman MRN: [id number] Date of Birth: 07/28/1965 Referring Provider:  Carole Civil, MD  Encounter Date: 10/17/2014      OT End of Session - 10/17/14 1626    Visit Number 2   Number of Visits 12   Date for OT Re-Evaluation 12/11/14  Mini reassess 11/09/2014   Authorization Type None   OT Start Time 1517   OT Stop Time 1600   OT Time Calculation (min) 43 min   Activity Tolerance Patient tolerated treatment well   Behavior During Therapy [facility] for tasks assessed/performed      Past Medical History  Diagnosis Date  . Type II or unspecified type diabetes mellitus without mention of complication, uncontrolled   . Other and unspecified hyperlipidemia   . Unspecified essential hypertension   . Obesity, unspecified     No past surgical history on file.  There were no vitals filed for this visit.  Visit Diagnosis:  Pain in left shoulder  Tight fascia  Decreased strength  Stiffness of joint, shoulder region, left      Subjective Assessment - 10/17/14 1539    Subjective  S: I have a little pain in the back of the shoulder too.    Patient is accompained by: Interpreter   Currently in Pain? Yes   Pain Score 5    Pain Location Shoulder   Pain Orientation Left   Pain Descriptors / Indicators Aching   Pain Type Chronic pain            [facility] OT Assessment - 10/17/14 1540    Assessment   Diagnosis adhesive capsuliltis of left shoulder   Precautions   Precautions None                  OT Treatments/Exercises (OP) - 10/17/14 1541    Exercises   Exercises Shoulder   Shoulder Exercises: Supine   Protraction PROM;AAROM;10 reps   Horizontal ABduction PROM;AAROM;10 reps   External Rotation PROM;AAROM;10 reps   Internal Rotation PROM;AAROM;10 reps   Flexion PROM;AAROM;10 reps   ABduction PROM;AAROM;10 reps   Shoulder Exercises: Seated   Elevation AROM;10 reps   Extension AROM;10 reps   Row AROM;10 reps   Shoulder Exercises: Therapy Ball   Flexion 10 reps   ABduction 10 reps   Manual Therapy   Manual Therapy Myofascial release;Muscle Energy Technique   Myofascial Release Myofascial release to left upper arm, trapezius, and scapularis region to decrease fascial restrictions and increase jont mobility in a pain free zone.    Muscle Energy Technique Muscle energy technique to left anterior deltoid to relax tone and and muscle spasm and improve range of motion.                OT Education - 10/17/14 1551    Education Details AAROM exercises   Person(s) Educated Patient   Methods Explanation;Demonstration;Handout   Comprehension Verbalized understanding;Returned demonstration          OT Short Term Goals - 10/17/14 1628    OT SHORT TERM GOAL #1   Title Pt will be educated on HEP.    Status On-going   OT SHORT TERM GOAL #2   Title Pt will decrease fascial restrictions from max to mod amounts.    Status On-going   OT SHORT TERM GOAL #3  Title Pt will decrease pain to 4/10 or less during daily tasks.    Status On-going   OT SHORT TERM GOAL #4   Title Pt will increase AROM to [facility] to increase ability to donn shirts.    Status On-going   OT SHORT TERM GOAL #5   Title Pt will increase strength to 3+/5 to increase ability fix hair.    Status On-going           OT Long Term Goals - 10/17/14 1628    OT LONG TERM GOAL #1   Title Pt will return to highest level of functioning and independence in daily tasks.    Status On-going   OT LONG TERM GOAL #2   Title Pt will decrease fascial restrictions from mod to min amounts or less.    Status On-going   OT LONG TERM GOAL #3   Title Pt will decrease pain to 2/10 or less during daily tasks.    Status On-going   OT LONG TERM GOAL #4   Title Pt will increase AROM to WNL  to increase ability to reach into overhead cabinets.    Status On-going   OT LONG TERM GOAL #5   Title Pt will increase strength 4+/5 to increase ability to complete work tasks.    Status On-going               Plan - 10/17/14 1626    Clinical Impression Statement A: Initiated myofascial release, muscle energy technique, passive stretching, AAROM and therapy ball stretches this session. Pt had difficulty relaxing during stretching although was able to gain an increase in PROM when utilizing muscle energy technique. Added AAROM to HEP.   Plan P: Attempt pulleys and AAROM standing.        Problem List Patient Active Problem List   Diagnosis Date Noted  . Chest pain 08/06/2012  . Diabetes mellitus 08/06/2012  . HTN (hypertension) 08/06/2012    Ailene Ravel, OTR/L,CBIS  [phone]  10/17/2014, 4:30 PM  [place] [address] [place], [place], [address] Phone: [phone]   Fax:  [phone]

## 2014-10-19 ENCOUNTER — Ambulatory Visit (HOSPITAL_COMMUNITY): Payer: No Typology Code available for payment source

## 2014-10-19 ENCOUNTER — Encounter (HOSPITAL_COMMUNITY): Payer: Self-pay

## 2014-10-19 DIAGNOSIS — M6289 Other specified disorders of muscle: Secondary | ICD-10-CM

## 2014-10-19 DIAGNOSIS — M25512 Pain in left shoulder: Secondary | ICD-10-CM

## 2014-10-19 DIAGNOSIS — M629 Disorder of muscle, unspecified: Secondary | ICD-10-CM

## 2014-10-19 DIAGNOSIS — R531 Weakness: Secondary | ICD-10-CM

## 2014-10-19 DIAGNOSIS — M25612 Stiffness of left shoulder, not elsewhere classified: Secondary | ICD-10-CM

## 2014-10-19 NOTE — Therapy (Signed)
[place], [place], [address] Phone: [phone]   Fax:  [phone]  Occupational Therapy Treatment  Patient Details  Name: Yesenia Hoffman MRN: [id number] Date of Birth: 1966-01-18 Referring Provider:  Soyla Dryer, PA-C  Encounter Date: 10/19/2014      OT End of Session - 10/19/14 1552    Visit Number 3   Number of Visits 12   Date for OT Re-Evaluation 12/11/14  Mini reassess 11/09/2014   Authorization Type None   OT Start Time 1434   OT Stop Time 1515   OT Time Calculation (min) 41 min   Activity Tolerance Patient tolerated treatment well   Behavior During Therapy [facility] for tasks assessed/performed      Past Medical History  Diagnosis Date  . Type II or unspecified type diabetes mellitus without mention of complication, uncontrolled   . Other and unspecified hyperlipidemia   . Unspecified essential hypertension   . Obesity, unspecified     No past surgical history on file.  There were no vitals filed for this visit.  Visit Diagnosis:  Pain in left shoulder  Tight fascia  Decreased strength  Stiffness of joint, shoulder region, left      Subjective Assessment - 10/19/14 1453    Subjective  S: My arm feels a little better today.   Patient is accompained by: Interpreter   Currently in Pain? No/denies            [facility] OT Assessment - 10/19/14 1454    Assessment   Diagnosis adhesive capsuliltis of left shoulder   Precautions   Precautions None                  OT Treatments/Exercises (OP) - 10/19/14 1454    Exercises   Exercises Shoulder   Shoulder Exercises: Supine   Protraction PROM;AAROM;10 reps   Horizontal ABduction PROM;AAROM;10 reps   External Rotation PROM;AAROM;10 reps   Internal Rotation PROM;AAROM;10 reps   Flexion PROM;AAROM;10 reps   ABduction PROM;AAROM;10 reps   Shoulder Exercises: Seated   Protraction AAROM;10 reps   Shoulder Exercises: Pulleys    Flexion 1 minute   ABduction 1 minute   Shoulder Exercises: ROM/Strengthening   Wall Wash 1'   Proximal Shoulder Strengthening, Supine 10X no rest breaks   Proximal Shoulder Strengthening, Seated 10X with no rest breaks   Manual Therapy   Manual Therapy Myofascial release;Muscle Energy Technique   Myofascial Release Myofascial release to left upper arm, trapezius, and scapularis region to decrease fascial restrictions and increase jont mobility in a pain free zone.    Muscle Energy Technique Muscle energy technique to left anterior deltoid to relax tone and and muscle spasm and improve range of motion.                  OT Short Term Goals - 10/17/14 1628    OT SHORT TERM GOAL #1   Title Pt will be educated on HEP.    Status On-going   OT SHORT TERM GOAL #2   Title Pt will decrease fascial restrictions from max to mod amounts.    Status On-going   OT SHORT TERM GOAL #3   Title Pt will decrease pain to 4/10 or less during daily tasks.    Status On-going   OT SHORT TERM GOAL #4   Title Pt will increase AROM to [facility] to increase ability to donn shirts.    Status On-going   OT SHORT TERM  GOAL #5   Title Pt will increase strength to 3+/5 to increase ability fix hair.    Status On-going           OT Long Term Goals - 10/17/14 1628    OT LONG TERM GOAL #1   Title Pt will return to highest level of functioning and independence in daily tasks.    Status On-going   OT LONG TERM GOAL #2   Title Pt will decrease fascial restrictions from mod to min amounts or less.    Status On-going   OT LONG TERM GOAL #3   Title Pt will decrease pain to 2/10 or less during daily tasks.    Status On-going   OT LONG TERM GOAL #4   Title Pt will increase AROM to WNL to increase ability to reach into overhead cabinets.    Status On-going   OT LONG TERM GOAL #5   Title Pt will increase strength 4+/5 to increase ability to complete work tasks.    Status On-going                Plan - 10/19/14 1553    Clinical Impression Statement A: Added wall wash and pulleys. Also completed AAROM seated. Pt tolerated well with reports of pain during exercises.    Plan P: Add thumb tacks and pro/ret/dep/elev        Problem List Patient Active Problem List   Diagnosis Date Noted  . Chest pain 08/06/2012  . Diabetes mellitus 08/06/2012  . HTN (hypertension) 08/06/2012    Ailene Ravel, OTR/L,CBIS  [phone]  10/19/2014, 4:39 PM  [place] [address] [place], [place], [address] Phone: [phone]   Fax:  [phone]

## 2014-10-23 ENCOUNTER — Encounter (HOSPITAL_COMMUNITY): Payer: Self-pay | Admitting: Specialist

## 2014-10-24 ENCOUNTER — Encounter (HOSPITAL_COMMUNITY): Payer: Self-pay

## 2014-10-24 ENCOUNTER — Ambulatory Visit (HOSPITAL_COMMUNITY): Payer: No Typology Code available for payment source

## 2014-10-24 DIAGNOSIS — M629 Disorder of muscle, unspecified: Secondary | ICD-10-CM

## 2014-10-24 DIAGNOSIS — M25612 Stiffness of left shoulder, not elsewhere classified: Secondary | ICD-10-CM

## 2014-10-24 DIAGNOSIS — M25512 Pain in left shoulder: Secondary | ICD-10-CM | POA: Diagnosis not present

## 2014-10-24 DIAGNOSIS — M6289 Other specified disorders of muscle: Secondary | ICD-10-CM

## 2014-10-24 DIAGNOSIS — R531 Weakness: Secondary | ICD-10-CM

## 2014-10-24 NOTE — Therapy (Signed)
[place], [place], [address] Phone: [phone]   Fax:  [phone]  Occupational Therapy Treatment  Patient Details  Name: Yesenia Hoffman MRN: [id number] Date of Birth: 11-Nov-1965 Referring Provider:  Carole Civil, MD  Encounter Date: 10/24/2014      OT End of Session - 10/24/14 1532    Visit Number 4   Number of Visits 12   Date for OT Re-Evaluation 12/11/14  Mini reassess 11/09/2014   Authorization Type None   OT Start Time 1345   OT Stop Time 1430   OT Time Calculation (min) 45 min   Activity Tolerance Patient tolerated treatment well   Behavior During Therapy [facility] for tasks assessed/performed      Past Medical History  Diagnosis Date  . Type II or unspecified type diabetes mellitus without mention of complication, uncontrolled   . Other and unspecified hyperlipidemia   . Unspecified essential hypertension   . Obesity, unspecified     No past surgical history on file.  There were no vitals filed for this visit.  Visit Diagnosis:  Pain in left shoulder  Tight fascia  Decreased strength  Stiffness of joint, shoulder region, left      Subjective Assessment - 10/24/14 1406    Subjective  S: It still hurts on the side here.    Currently in Pain? Yes   Pain Score 6    Pain Location Shoulder   Pain Orientation Left   Pain Descriptors / Indicators Aching   Pain Type Chronic pain            [facility] OT Assessment - 10/24/14 1406    Assessment   Diagnosis adhesive capsuliltis of left shoulder   Precautions   Precautions None                  OT Treatments/Exercises (OP) - 10/24/14 1405    Exercises   Exercises Shoulder   Shoulder Exercises: Supine   Protraction PROM;AAROM;10 reps   Horizontal ABduction PROM;AAROM;10 reps   External Rotation PROM;AAROM;10 reps   Internal Rotation PROM;AAROM;10 reps   Flexion PROM;AAROM;10 reps   ABduction PROM;AAROM;10 reps   Shoulder Exercises: Seated   Protraction AAROM;10 reps   Horizontal ABduction AAROM;10 reps   External Rotation AAROM;10 reps   Internal Rotation AAROM;10 reps   Flexion AAROM;10 reps   Abduction AAROM;10 reps   Shoulder Exercises: ROM/Strengthening   Wall Wash 1'   Thumb Tacks 1'   Proximal Shoulder Strengthening, Supine 10X no rest breaks   Proximal Shoulder Strengthening, Seated 10X with no rest breaks   Prot/Ret//Elev/Dep 1'   Manual Therapy   Manual Therapy Myofascial release;Muscle Energy Technique   Myofascial Release Myofascial release to left upper arm, trapezius, and scapularis region to decrease fascial restrictions and increase jont mobility in a pain free zone.    Muscle Energy Technique Muscle energy technique to left anterior deltoid to relax tone and and muscle spasm and improve range of motion.                  OT Short Term Goals - 10/17/14 1628    OT SHORT TERM GOAL #1   Title Pt will be educated on HEP.    Status On-going   OT SHORT TERM GOAL #2   Title Pt will decrease fascial restrictions from max to mod amounts.    Status On-going   OT SHORT TERM GOAL #3   Title Pt will decrease  pain to 4/10 or less during daily tasks.    Status On-going   OT SHORT TERM GOAL #4   Title Pt will increase AROM to [facility] to increase ability to donn shirts.    Status On-going   OT SHORT TERM GOAL #5   Title Pt will increase strength to 3+/5 to increase ability fix hair.    Status On-going           OT Long Term Goals - 10/17/14 1628    OT LONG TERM GOAL #1   Title Pt will return to highest level of functioning and independence in daily tasks.    Status On-going   OT LONG TERM GOAL #2   Title Pt will decrease fascial restrictions from mod to min amounts or less.    Status On-going   OT LONG TERM GOAL #3   Title Pt will decrease pain to 2/10 or less during daily tasks.    Status On-going   OT LONG TERM GOAL #4   Title Pt will increase AROM to WNL to increase  ability to reach into overhead cabinets.    Status On-going   OT LONG TERM GOAL #5   Title Pt will increase strength 4+/5 to increase ability to complete work tasks.    Status On-going               Plan - 10/24/14 1532    Clinical Impression Statement A: Added thumb tacks and pro/ret/elev/dep. Patient tolerated well and continues to experience increased pain during passive stretching for shoulder abduction mainly.   Plan P: Increase AAROM reps.        Problem List Patient Active Problem List   Diagnosis Date Noted  . Chest pain 08/06/2012  . Diabetes mellitus 08/06/2012  . HTN (hypertension) 08/06/2012    Ailene Ravel, OTR/L,CBIS  [phone]  10/24/2014, 3:35 PM  [place] [address] [place], [place], [address] Phone: [phone]   Fax:  [phone]

## 2014-10-26 ENCOUNTER — Encounter (HOSPITAL_COMMUNITY): Payer: Self-pay | Admitting: Occupational Therapy

## 2014-10-31 ENCOUNTER — Ambulatory Visit (HOSPITAL_COMMUNITY): Payer: No Typology Code available for payment source | Attending: Orthopedic Surgery | Admitting: Occupational Therapy

## 2014-10-31 ENCOUNTER — Encounter (HOSPITAL_COMMUNITY): Payer: Self-pay | Admitting: Occupational Therapy

## 2014-10-31 DIAGNOSIS — M7582 Other shoulder lesions, left shoulder: Secondary | ICD-10-CM | POA: Insufficient documentation

## 2014-10-31 DIAGNOSIS — M25512 Pain in left shoulder: Secondary | ICD-10-CM | POA: Insufficient documentation

## 2014-10-31 DIAGNOSIS — R531 Weakness: Secondary | ICD-10-CM

## 2014-10-31 DIAGNOSIS — M25612 Stiffness of left shoulder, not elsewhere classified: Secondary | ICD-10-CM

## 2014-10-31 DIAGNOSIS — M6281 Muscle weakness (generalized): Secondary | ICD-10-CM | POA: Insufficient documentation

## 2014-10-31 DIAGNOSIS — M629 Disorder of muscle, unspecified: Secondary | ICD-10-CM

## 2014-10-31 DIAGNOSIS — M6289 Other specified disorders of muscle: Secondary | ICD-10-CM

## 2014-10-31 NOTE — Therapy (Signed)
[place], [place], [address] Phone: [phone]   Fax:  [phone]  Occupational Therapy Treatment  Patient Details  Name: Yesenia Hoffman MRN: [id number] Date of Birth: 1965/07/22 Referring Provider:  Soyla Dryer, PA-C  Encounter Date: 10/31/2014      OT End of Session - 10/31/14 1439    Visit Number 5   Number of Visits 12   Date for OT Re-Evaluation 12/11/14  Mini reassess 11/09/2014   Authorization Type None   OT Start Time 1306   OT Stop Time 1346   OT Time Calculation (min) 40 min   Activity Tolerance Patient tolerated treatment well   Behavior During Therapy [facility] for tasks assessed/performed      Past Medical History  Diagnosis Date  . Type II or unspecified type diabetes mellitus without mention of complication, uncontrolled   . Other and unspecified hyperlipidemia   . Unspecified essential hypertension   . Obesity, unspecified     No past surgical history on file.  There were no vitals filed for this visit.  Visit Diagnosis:  Pain in left shoulder  Tight fascia  Decreased strength  Stiffness of joint, shoulder region, left  Decreased range of motion of left shoulder      Subjective Assessment - 10/31/14 1309    Subjective  S: I'm feeling pain on the other side too.    Patient is accompained by: Interpreter   Currently in Pain? Yes   Pain Score 6    Pain Location Shoulder   Pain Orientation Left   Pain Descriptors / Indicators Aching   Pain Type Chronic pain            [facility] OT Assessment - 10/31/14 1308    Assessment   Diagnosis adhesive capsuliltis of left shoulder   Precautions   Precautions None           OT Treatments/Exercises (OP) - 10/31/14 1310    Exercises   Exercises Shoulder   Shoulder Exercises: Supine   Protraction PROM;10 reps;AAROM;12 reps   Horizontal ABduction PROM;10 reps;AAROM;12 reps   External Rotation PROM;10 reps;AAROM;12 reps   Internal Rotation PROM;10 reps;AAROM;12 reps   Flexion PROM;10 reps;AAROM;12 reps   ABduction PROM;10 reps;AAROM;12 reps   Shoulder Exercises: Seated   Protraction AAROM;10 reps   Horizontal ABduction AAROM;10 reps   External Rotation AAROM;10 reps   Internal Rotation AAROM;10 reps   Flexion AAROM;10 reps   Abduction AAROM;10 reps   Shoulder Exercises: ROM/Strengthening   Wall Wash 1'   Thumb Tacks 1'   Proximal Shoulder Strengthening, Supine 10X no rest breaks   Proximal Shoulder Strengthening, Seated 10X with no rest breaks   Prot/Ret//Elev/Dep 1'   Manual Therapy   Manual Therapy Myofascial release;Muscle Energy Technique   Myofascial Release Myofascial release to left upper arm, trapezius, and scapularis region to decrease fascial restrictions and increase jont mobility in a pain free zone.    Muscle Energy Technique Muscle energy technique to left anterior deltoid to relax tone and and muscle spasm and improve range of motion.            OT Short Term Goals - 10/17/14 1628    OT SHORT TERM GOAL #1   Title Pt will be educated on HEP.    Status On-going   OT SHORT TERM GOAL #2   Title Pt will decrease fascial restrictions from max to mod amounts.    Status On-going   OT SHORT TERM  GOAL #3   Title Pt will decrease pain to 4/10 or less during daily tasks.    Status On-going   OT SHORT TERM GOAL #4   Title Pt will increase AROM to [facility] to increase ability to donn shirts.    Status On-going   OT SHORT TERM GOAL #5   Title Pt will increase strength to 3+/5 to increase ability fix hair.    Status On-going           OT Long Term Goals - 10/17/14 1628    OT LONG TERM GOAL #1   Title Pt will return to highest level of functioning and independence in daily tasks.    Status On-going   OT LONG TERM GOAL #2   Title Pt will decrease fascial restrictions from mod to min amounts or less.    Status On-going   OT LONG TERM GOAL #3   Title Pt will decrease pain to 2/10 or less  during daily tasks.    Status On-going   OT LONG TERM GOAL #4   Title Pt will increase AROM to WNL to increase ability to reach into overhead cabinets.    Status On-going   OT LONG TERM GOAL #5   Title Pt will increase strength 4+/5 to increase ability to complete work tasks.    Status On-going               Plan - 10/31/14 1439    Clinical Impression Statement A: Increased AAROM repetitions to 12 this session. Pt reports increased pain along lower trapezius and scapularis regions this date. Pt reports she is completing her HEP and it is going well.    Plan P: Resume pulleys, continue working to increase PROM to [facility].         Problem List Patient Active Problem List   Diagnosis Date Noted  . Chest pain 08/06/2012  . Diabetes mellitus 08/06/2012  . HTN (hypertension) 08/06/2012    Guadelupe Sabin, OTR/L  [phone]  10/31/2014, 2:42 PM  [place] [address] [place], [place], [address] Phone: [phone]   Fax:  [phone]

## 2014-11-02 ENCOUNTER — Encounter (HOSPITAL_COMMUNITY): Payer: Self-pay

## 2014-11-02 ENCOUNTER — Ambulatory Visit (HOSPITAL_COMMUNITY): Payer: No Typology Code available for payment source

## 2014-11-02 DIAGNOSIS — M6289 Other specified disorders of muscle: Secondary | ICD-10-CM

## 2014-11-02 DIAGNOSIS — R531 Weakness: Secondary | ICD-10-CM

## 2014-11-02 DIAGNOSIS — M629 Disorder of muscle, unspecified: Secondary | ICD-10-CM

## 2014-11-02 DIAGNOSIS — M25612 Stiffness of left shoulder, not elsewhere classified: Secondary | ICD-10-CM

## 2014-11-02 DIAGNOSIS — M25512 Pain in left shoulder: Secondary | ICD-10-CM

## 2014-11-02 NOTE — Therapy (Signed)
[place], [place], [address] Phone: [phone]   Fax:  [phone]  Occupational Therapy Treatment  Patient Details  Name: Yesenia Hoffman MRN: [id number] Date of Birth: 20-May-1965 Referring Provider:  Carole Civil, MD  Encounter Date: 11/02/2014      OT End of Session - 11/02/14 1038    Visit Number 6   Number of Visits 12   Date for OT Re-Evaluation 12/11/14  Mini reassess 11/09/2014   Authorization Type None   OT Start Time 0936   OT Stop Time 1018   OT Time Calculation (min) 42 min   Activity Tolerance Patient tolerated treatment well   Behavior During Therapy [facility] for tasks assessed/performed      Past Medical History  Diagnosis Date  . Type II or unspecified type diabetes mellitus without mention of complication, uncontrolled   . Other and unspecified hyperlipidemia   . Unspecified essential hypertension   . Obesity, unspecified     No past surgical history on file.  There were no vitals filed for this visit.  Visit Diagnosis:  Pain in left shoulder  Tight fascia  Decreased strength  Stiffness of joint, shoulder region, left      Subjective Assessment - 11/02/14 0954    Subjective  S: It's hurts a little.   Currently in Pain? Yes   Pain Score 4    Pain Location Shoulder   Pain Orientation Left   Pain Descriptors / Indicators Aching   Pain Type Chronic pain            [facility] OT Assessment - 11/02/14 0955    Assessment   Diagnosis adhesive capsuliltis of left shoulder   Precautions   Precautions None                  OT Treatments/Exercises (OP) - 11/02/14 0955    Exercises   Exercises Shoulder   Shoulder Exercises: Supine   Protraction PROM;10 reps;AAROM;12 reps   Horizontal ABduction PROM;10 reps;AAROM;12 reps   External Rotation PROM;10 reps;AAROM;12 reps   Internal Rotation PROM;10 reps;AAROM;12 reps   Flexion PROM;10 reps;AAROM;12 reps   ABduction  PROM;10 reps;AAROM;12 reps   Shoulder Exercises: Seated   Protraction AAROM;12 reps   Horizontal ABduction AAROM;12 reps   External Rotation AAROM;12 reps   Internal Rotation AAROM;12 reps   Flexion AAROM;12 reps   Abduction AAROM;12 reps   Shoulder Exercises: Pulleys   Flexion 2 minutes   ABduction 2 minutes   Shoulder Exercises: ROM/Strengthening   Wall Wash 1'   Thumb Tacks 1'   Proximal Shoulder Strengthening, Supine 12X no rest breaks   Proximal Shoulder Strengthening, Seated 12X no rest breaks   Prot/Ret//Elev/Dep 1'   Manual Therapy   Manual Therapy Myofascial release;Muscle Energy Technique   Myofascial Release Myofascial release to left upper arm, trapezius, and scapularis region to decrease fascial restrictions and increase jont mobility in a pain free zone.    Muscle Energy Technique Muscle energy technique to left anterior deltoid to relax tone and and muscle spasm and improve range of motion.                  OT Short Term Goals - 10/17/14 1628    OT SHORT TERM GOAL #1   Title Pt will be educated on HEP.    Status On-going   OT SHORT TERM GOAL #2   Title Pt will decrease fascial restrictions from max to mod amounts.  Status On-going   OT SHORT TERM GOAL #3   Title Pt will decrease pain to 4/10 or less during daily tasks.    Status On-going   OT SHORT TERM GOAL #4   Title Pt will increase AROM to [facility] to increase ability to donn shirts.    Status On-going   OT SHORT TERM GOAL #5   Title Pt will increase strength to 3+/5 to increase ability fix hair.    Status On-going           OT Long Term Goals - 10/17/14 1628    OT LONG TERM GOAL #1   Title Pt will return to highest level of functioning and independence in daily tasks.    Status On-going   OT LONG TERM GOAL #2   Title Pt will decrease fascial restrictions from mod to min amounts or less.    Status On-going   OT LONG TERM GOAL #3   Title Pt will decrease pain to 2/10 or less during daily  tasks.    Status On-going   OT LONG TERM GOAL #4   Title Pt will increase AROM to WNL to increase ability to reach into overhead cabinets.    Status On-going   OT LONG TERM GOAL #5   Title Pt will increase strength 4+/5 to increase ability to complete work tasks.    Status On-going               Plan - 11/02/14 1038    Clinical Impression Statement A: No interpreter present this date. Increase pulley time to 2'. Pt tolerated well.    Plan P: Attempt AROM supine.         Problem List Patient Active Problem List   Diagnosis Date Noted  . Chest pain 08/06/2012  . Diabetes mellitus 08/06/2012  . HTN (hypertension) 08/06/2012    Ailene Ravel, OTR/L,CBIS  [phone]  11/02/2014, 10:43 AM  [place], [place], [address] Phone: [phone]   Fax:  [phone]

## 2014-11-07 ENCOUNTER — Ambulatory Visit (HOSPITAL_COMMUNITY): Payer: No Typology Code available for payment source | Admitting: Occupational Therapy

## 2014-11-07 ENCOUNTER — Encounter (HOSPITAL_COMMUNITY): Payer: Self-pay | Admitting: Occupational Therapy

## 2014-11-07 DIAGNOSIS — M6289 Other specified disorders of muscle: Secondary | ICD-10-CM

## 2014-11-07 DIAGNOSIS — M25512 Pain in left shoulder: Secondary | ICD-10-CM

## 2014-11-07 DIAGNOSIS — R531 Weakness: Secondary | ICD-10-CM

## 2014-11-07 DIAGNOSIS — M25612 Stiffness of left shoulder, not elsewhere classified: Secondary | ICD-10-CM

## 2014-11-07 DIAGNOSIS — M629 Disorder of muscle, unspecified: Secondary | ICD-10-CM

## 2014-11-07 NOTE — Therapy (Signed)
[place], [place], [address] Phone: [phone]   Fax:  [phone]  Occupational Therapy Treatment  Patient Details  Name: Yesenia Hoffman MRN: [id number] Date of Birth: 01-28-1966 Referring Provider:  Carole Civil, MD  Encounter Date: 11/07/2014      OT End of Session - 11/07/14 1157    Visit Number 7   Number of Visits 12   Date for OT Re-Evaluation 12/11/14  Mini reassess 11/09/2014   Authorization Type None   OT Start Time 1015   OT Stop Time 1100   OT Time Calculation (min) 45 min   Activity Tolerance Patient tolerated treatment well   Behavior During Therapy [facility] for tasks assessed/performed      Past Medical History  Diagnosis Date  . Type II or unspecified type diabetes mellitus without mention of complication, uncontrolled   . Other and unspecified hyperlipidemia   . Unspecified essential hypertension   . Obesity, unspecified     No past surgical history on file.  There were no vitals filed for this visit.  Visit Diagnosis:  Pain in left shoulder  Tight fascia  Decreased strength  Stiffness of joint, shoulder region, left  Decreased range of motion of left shoulder      Subjective Assessment - 11/07/14 1017    Subjective  S: It's feeling ok, it hurts a little bit today.    Currently in Pain? Yes   Pain Score 4    Pain Location Shoulder   Pain Orientation Left   Pain Descriptors / Indicators Aching   Pain Type Chronic pain            [facility] OT Assessment - 11/07/14 1016    Assessment   Diagnosis adhesive capsuliltis of left shoulder   Precautions   Precautions None                  OT Treatments/Exercises (OP) - 11/07/14 1019    Exercises   Exercises Shoulder   Shoulder Exercises: Supine   Protraction PROM;AROM;10 reps   Horizontal ABduction PROM;AROM;10 reps   External Rotation PROM;AROM;10 reps   Internal Rotation PROM;AROM;10 reps   Flexion  PROM;AROM;10 reps   ABduction PROM;AROM;10 reps   Shoulder Exercises: Seated   Protraction AAROM;12 reps   Horizontal ABduction AAROM;12 reps   External Rotation AAROM;12 reps   Internal Rotation AAROM;12 reps   Flexion AAROM;12 reps   Abduction AAROM;12 reps   Shoulder Exercises: Pulleys   Flexion 2 minutes   ABduction 2 minutes   Shoulder Exercises: ROM/Strengthening   Wall Wash 1'30"   Thumb Tacks 1'30"   Proximal Shoulder Strengthening, Supine 12X no rest breaks   Proximal Shoulder Strengthening, Seated 12X no rest breaks   Prot/Ret//Elev/Dep 1'   Manual Therapy   Manual Therapy Myofascial release;Muscle Energy Technique   Myofascial Release Myofascial release to left upper arm, trapezius, and scapularis region to decrease fascial restrictions and increase jont mobility in a pain free zone.    Muscle Energy Technique Muscle energy technique to left anterior deltoid to relax tone and and muscle spasm and improve range of motion.                  OT Short Term Goals - 10/17/14 1628    OT SHORT TERM GOAL #1   Title Pt will be educated on HEP.    Status On-going   OT SHORT TERM GOAL #2   Title Pt will decrease  fascial restrictions from max to mod amounts.    Status On-going   OT SHORT TERM GOAL #3   Title Pt will decrease pain to 4/10 or less during daily tasks.    Status On-going   OT SHORT TERM GOAL #4   Title Pt will increase AROM to [facility] to increase ability to donn shirts.    Status On-going   OT SHORT TERM GOAL #5   Title Pt will increase strength to 3+/5 to increase ability fix hair.    Status On-going           OT Long Term Goals - 10/17/14 1628    OT LONG TERM GOAL #1   Title Pt will return to highest level of functioning and independence in daily tasks.    Status On-going   OT LONG TERM GOAL #2   Title Pt will decrease fascial restrictions from mod to min amounts or less.    Status On-going   OT LONG TERM GOAL #3   Title Pt will decrease pain  to 2/10 or less during daily tasks.    Status On-going   OT LONG TERM GOAL #4   Title Pt will increase AROM to WNL to increase ability to reach into overhead cabinets.    Status On-going   OT LONG TERM GOAL #5   Title Pt will increase strength 4+/5 to increase ability to complete work tasks.    Status On-going               Plan - 11/07/14 1157    Clinical Impression Statement A: Added AROM in supine, pt able to achieve approximately 65% range. Added ER wall stretch, as pt reports ER is the most difficult for her. Pt tolerated treatment well, she does have increased discomfort during PROM abduction.    Plan P: Attempt AROM seated, follow-up on ER stretch.         Problem List Patient Active Problem List   Diagnosis Date Noted  . Chest pain 08/06/2012  . Diabetes mellitus 08/06/2012  . HTN (hypertension) 08/06/2012    Guadelupe Sabin, OTR/L  [phone]  11/07/2014, 12:01 PM  [place] [address] [place], [place], [address] Phone: [phone]   Fax:  [phone]

## 2014-11-09 ENCOUNTER — Ambulatory Visit (HOSPITAL_COMMUNITY): Payer: No Typology Code available for payment source | Admitting: Occupational Therapy

## 2014-11-09 ENCOUNTER — Encounter (HOSPITAL_COMMUNITY): Payer: Self-pay | Admitting: Occupational Therapy

## 2014-11-09 DIAGNOSIS — M6289 Other specified disorders of muscle: Secondary | ICD-10-CM

## 2014-11-09 DIAGNOSIS — R531 Weakness: Secondary | ICD-10-CM

## 2014-11-09 DIAGNOSIS — M629 Disorder of muscle, unspecified: Secondary | ICD-10-CM

## 2014-11-09 DIAGNOSIS — M25512 Pain in left shoulder: Secondary | ICD-10-CM

## 2014-11-09 DIAGNOSIS — M25612 Stiffness of left shoulder, not elsewhere classified: Secondary | ICD-10-CM

## 2014-11-09 NOTE — Therapy (Signed)
[place], [place], [address] Phone: [phone]   Fax:  [phone]  Occupational Therapy Treatment  Patient Details  Name: Yesenia Hoffman MRN: [id number] Date of Birth: January 27, 1966 Referring Provider:  Carole Civil, MD  Encounter Date: 11/09/2014      OT End of Session - 11/09/14 1152    Visit Number 8   Number of Visits 12   Date for OT Re-Evaluation 12/11/14  Mini reassess 11/09/2014   Authorization Type None   OT Start Time 1015   OT Stop Time 1057   OT Time Calculation (min) 42 min   Activity Tolerance Patient tolerated treatment well   Behavior During Therapy [facility] for tasks assessed/performed      Past Medical History  Diagnosis Date  . Type II or unspecified type diabetes mellitus without mention of complication, uncontrolled   . Other and unspecified hyperlipidemia   . Unspecified essential hypertension   . Obesity, unspecified     No past surgical history on file.  There were no vitals filed for this visit.  Visit Diagnosis:  Pain in left shoulder  Tight fascia  Decreased strength  Stiffness of joint, shoulder region, left  Decreased range of motion of left shoulder      Subjective Assessment - 11/09/14 1015    Subjective  S:    Currently in Pain? Yes   Pain Score 3    Pain Location Shoulder   Pain Orientation Left   Pain Descriptors / Indicators Aching   Pain Type Chronic pain                      OT Treatments/Exercises (OP) - 11/09/14 1018    Exercises   Exercises Shoulder   Shoulder Exercises: Supine   Protraction PROM;AROM;10 reps   Horizontal ABduction PROM;AROM;10 reps   External Rotation PROM;AROM;10 reps   Internal Rotation PROM;AROM;10 reps   Flexion PROM;AROM;10 reps   ABduction PROM;AROM;10 reps                              Shoulder Exercises: Standing   Protraction AROM;10 reps   Horizontal ABduction AROM;10 reps   External  Rotation AROM;10 reps   Internal Rotation AROM;10 reps   Flexion AROM;10 reps   ABduction AROM;10 reps   Extension Theraband;10 reps   Theraband Level (Shoulder Extension) Level 2 (Red)   Row Theraband;10 reps   Theraband Level (Shoulder Row) Level 2 (Red)   Retraction Theraband;10 reps   Theraband Level (Shoulder Retraction) Level 2 (Red)   Shoulder Exercises: Pulleys   Flexion 2 minutes   ABduction 2 minutes   Shoulder Exercises: ROM/Strengthening   Wall Wash 1'30"   Proximal Shoulder Strengthening, Supine 12X no rest breaks   Proximal Shoulder Strengthening, Seated 12X no rest breaks   Prot/Ret//Elev/Dep 1'   Manual Therapy   Manual Therapy Myofascial release;Muscle Energy Technique   Myofascial Release Myofascial release to left upper arm, trapezius, and scapularis region to decrease fascial restrictions and increase jont mobility in a pain free zone.    Muscle Energy Technique Muscle energy technique to left anterior deltoid to relax tone and and muscle spasm and improve range of motion.                  OT Short Term Goals - 10/17/14 1628    OT SHORT TERM GOAL #1   Title Pt  will be educated on HEP.    Status On-going   OT SHORT TERM GOAL #2   Title Pt will decrease fascial restrictions from max to mod amounts.    Status On-going   OT SHORT TERM GOAL #3   Title Pt will decrease pain to 4/10 or less during daily tasks.    Status On-going   OT SHORT TERM GOAL #4   Title Pt will increase AROM to [facility] to increase ability to donn shirts.    Status On-going   OT SHORT TERM GOAL #5   Title Pt will increase strength to 3+/5 to increase ability fix hair.    Status On-going           OT Long Term Goals - 10/17/14 1628    OT LONG TERM GOAL #1   Title Pt will return to highest level of functioning and independence in daily tasks.    Status On-going   OT LONG TERM GOAL #2   Title Pt will decrease fascial restrictions from mod to min amounts or less.    Status  On-going   OT LONG TERM GOAL #3   Title Pt will decrease pain to 2/10 or less during daily tasks.    Status On-going   OT LONG TERM GOAL #4   Title Pt will increase AROM to WNL to increase ability to reach into overhead cabinets.    Status On-going   OT LONG TERM GOAL #5   Title Pt will increase strength 4+/5 to increase ability to complete work tasks.    Status On-going               Plan - 11/09/14 1152    Clinical Impression Statement A: No interpreter present this session, unable to follow-up on ER stretch at home. Added AROM in standing, scapular theraband. Pt required max verbal and visual cuing for theraband exercises. Pt tolerated treatment well.    Plan P: Follow-up on ER stretch. Work on independence in theraband exercises.         Problem List Patient Active Problem List   Diagnosis Date Noted  . Chest pain 08/06/2012  . Diabetes mellitus 08/06/2012  . HTN (hypertension) 08/06/2012    Guadelupe Sabin, OTR/L  [phone]  11/09/2014, 11:54 AM  [place], [place], [address] Phone: [phone]   Fax:  [phone]

## 2014-11-14 ENCOUNTER — Ambulatory Visit (HOSPITAL_COMMUNITY): Payer: No Typology Code available for payment source | Admitting: Occupational Therapy

## 2014-11-14 ENCOUNTER — Encounter (HOSPITAL_COMMUNITY): Payer: Self-pay | Admitting: Occupational Therapy

## 2014-11-14 DIAGNOSIS — M25512 Pain in left shoulder: Secondary | ICD-10-CM

## 2014-11-14 DIAGNOSIS — M6289 Other specified disorders of muscle: Secondary | ICD-10-CM

## 2014-11-14 DIAGNOSIS — M629 Disorder of muscle, unspecified: Secondary | ICD-10-CM

## 2014-11-14 DIAGNOSIS — M25612 Stiffness of left shoulder, not elsewhere classified: Secondary | ICD-10-CM

## 2014-11-14 DIAGNOSIS — R531 Weakness: Secondary | ICD-10-CM

## 2014-11-14 NOTE — Therapy (Signed)
[place], [place], [address] Phone: [phone]   Fax:  [phone]  Occupational Therapy Treatment  Patient Details  Name: Yesenia Hoffman MRN: [id number] Date of Birth: 1965-12-01 Referring Provider:  Carole Civil, MD  Encounter Date: 11/14/2014      OT End of Session - 11/14/14 1347    Visit Number 9   Number of Visits 12   Date for OT Re-Evaluation 12/11/14  Mini reassess 11/09/2014   Authorization Type None   OT Start Time 1300   OT Stop Time 1344   OT Time Calculation (min) 44 min   Activity Tolerance Patient tolerated treatment well   Behavior During Therapy [facility] for tasks assessed/performed      Past Medical History  Diagnosis Date  . Type II or unspecified type diabetes mellitus without mention of complication, uncontrolled   . Other and unspecified hyperlipidemia   . Unspecified essential hypertension   . Obesity, unspecified     No past surgical history on file.  There were no vitals filed for this visit.  Visit Diagnosis:  Pain in left shoulder  Tight fascia  Decreased strength  Stiffness of joint, shoulder region, left  Decreased range of motion of left shoulder      Subjective Assessment - 11/14/14 1255    Subjective  S: That stretch is hard because my house is so small.    Currently in Pain? No/denies            [facility] OT Assessment - 11/14/14 1255    Assessment   Diagnosis adhesive capsuliltis of left shoulder   Precautions   Precautions None                  OT Treatments/Exercises (OP) - 11/14/14 1301    Exercises   Exercises Shoulder   Shoulder Exercises: Supine   Protraction PROM;5 reps;AROM;12 reps   Horizontal ABduction PROM;5 reps;AROM;12 reps   External Rotation PROM;5 reps;AROM;12 reps   Internal Rotation PROM;5 reps;AROM;12 reps   Flexion PROM;5 reps;AROM;12 reps   ABduction PROM;5 reps;AROM;12 reps   Shoulder Exercises: Standing   Protraction AROM;10 reps   Horizontal ABduction AROM;10 reps   External Rotation AROM;10 reps   Internal Rotation AROM;10 reps   Flexion AROM;10 reps   ABduction AROM;10 reps   Extension Theraband;10 reps   Theraband Level (Shoulder Extension) Level 2 (Red)   Row Theraband;10 reps   Theraband Level (Shoulder Row) Level 2 (Red)   Retraction Theraband;10 reps   Theraband Level (Shoulder Retraction) Level 2 (Red)   Shoulder Exercises: ROM/Strengthening   Wall Wash 1'30"  flexion and abduction   Proximal Shoulder Strengthening, Supine 12X no rest breaks   Proximal Shoulder Strengthening, Seated 12X no rest breaks   Shoulder Exercises: Stretch   External Rotation Stretch 3 reps;10 seconds   Wall Stretch - Flexion 3 reps;10 seconds   Wall Stretch - ABduction 3 reps;10 seconds   Manual Therapy   Manual Therapy Myofascial release;Muscle Energy Technique   Myofascial Release Myofascial release to left upper arm, trapezius, and scapularis region to decrease fascial restrictions and increase jont mobility in a pain free zone.    Muscle Energy Technique Muscle energy technique to left anterior deltoid to relax tone and and muscle spasm and improve range of motion.                  OT Short Term Goals - 10/17/14 1628    OT SHORT  TERM GOAL #1   Title Pt will be educated on HEP.    Status On-going   OT SHORT TERM GOAL #2   Title Pt will decrease fascial restrictions from max to mod amounts.    Status On-going   OT SHORT TERM GOAL #3   Title Pt will decrease pain to 4/10 or less during daily tasks.    Status On-going   OT SHORT TERM GOAL #4   Title Pt will increase AROM to [facility] to increase ability to donn shirts.    Status On-going   OT SHORT TERM GOAL #5   Title Pt will increase strength to 3+/5 to increase ability fix hair.    Status On-going           OT Long Term Goals - 10/17/14 1628    OT LONG TERM GOAL #1   Title Pt will return to highest level of functioning and  independence in daily tasks.    Status On-going   OT LONG TERM GOAL #2   Title Pt will decrease fascial restrictions from mod to min amounts or less.    Status On-going   OT LONG TERM GOAL #3   Title Pt will decrease pain to 2/10 or less during daily tasks.    Status On-going   OT LONG TERM GOAL #4   Title Pt will increase AROM to WNL to increase ability to reach into overhead cabinets.    Status On-going   OT LONG TERM GOAL #5   Title Pt will increase strength 4+/5 to increase ability to complete work tasks.    Status On-going               Plan - 11/14/14 1347    Clinical Impression Statement A: Added wall stretches-flexion, abduction, ER this session. Pt required mod verbal and visual cuing for theraband exercises. Pt reports she is not able to complete ER stretch at home due to small space in her house. Pt pain limited during PROM exercises this session.    Plan P: Add ER table stretch to HEP. Update HEP for AROM exercises.         Problem List Patient Active Problem List   Diagnosis Date Noted  . Chest pain 08/06/2012  . Diabetes mellitus 08/06/2012  . HTN (hypertension) 08/06/2012    Guadelupe Sabin, OTR/L  [phone]  11/14/2014, 2:24 PM  [place], [place], [address] Phone: [phone]   Fax:  [phone]

## 2014-11-16 ENCOUNTER — Ambulatory Visit (INDEPENDENT_AMBULATORY_CARE_PROVIDER_SITE_OTHER): Payer: Self-pay | Admitting: Orthopedic Surgery

## 2014-11-16 ENCOUNTER — Encounter (HOSPITAL_COMMUNITY): Payer: Self-pay

## 2014-11-16 ENCOUNTER — Ambulatory Visit (HOSPITAL_COMMUNITY): Payer: No Typology Code available for payment source

## 2014-11-16 VITALS — BP 126/70 | Ht 63.0 in | Wt 157.0 lb

## 2014-11-16 DIAGNOSIS — M7502 Adhesive capsulitis of left shoulder: Secondary | ICD-10-CM

## 2014-11-16 DIAGNOSIS — M25512 Pain in left shoulder: Secondary | ICD-10-CM

## 2014-11-16 DIAGNOSIS — M6289 Other specified disorders of muscle: Secondary | ICD-10-CM

## 2014-11-16 DIAGNOSIS — M629 Disorder of muscle, unspecified: Secondary | ICD-10-CM

## 2014-11-16 DIAGNOSIS — R531 Weakness: Secondary | ICD-10-CM

## 2014-11-16 DIAGNOSIS — M25612 Stiffness of left shoulder, not elsewhere classified: Secondary | ICD-10-CM

## 2014-11-16 NOTE — Patient Instructions (Addendum)
1) Shoulder Protraction    Comience con los codos por su lado , poco a poco " empujar" hacia fuera delante de usted. Repetir  _10__veces 2-3__sistemas/ da    2) Shoulder Flexion  Supine:     Standing:         Comience con los brazos a los lados con los pulgares apuntando [address] arriba , levante lentamente ambos brazos hacia arriba y [place] adelante por encima . Repetir_10__veces, _2-3__sistemas/ da.     3) Horizontal abduction/adduction  Supine:   Standing:           Comience con los brazos hacia fuera delante de usted , pone en evidencia el lado en al forma de "T " . Mantener los brazos vez toda recta . Repetir  _10___veces, _2-3___ sistemas/ da.    4) Internal & External Rotation    * Ninguna banda * -Stand con los codos a los lados y los codos doblados 90 grados . Mueva sus antebrazos lejos de su cuerpo , y luego traer de vuelta hacia el interior del cuerpo. Repetir  10___veces, _2-3__sistemas/ da    5) Shoulder Abduction  Supine:     Standing:       Acostado sobre su espalda comienzan con los brazos [employer] la mesa al lado de su lado. mover lentamente los [employer] lados para que vayan por encima, en un movimiento de toma de saltar o ngel en la nieve . Repetir  10___veces, _2-3__sistemas / Merchant navy officer (Passive)   Apoye el antebrazo sobre una mesa con el codo doblado y la palma [place]. Doble la cintura hacia adelante hasta sentir un estiramiento. Mantenga _10___ segundos. Repita __3__ veces.   Copyright  VHI. All rights reserved.

## 2014-11-16 NOTE — Therapy (Signed)
[place], [place], [address] Phone: [phone]   Fax:  [phone]  Occupational Therapy Treatment & reassessment  Patient Details  Name: Yesenia Hoffman MRN: [id number] Date of Birth: 14-Feb-1966 Referring Provider:  Soyla Dryer, PA-C  Encounter Date: 11/16/2014      OT End of Session - 11/16/14 1033    Visit Number 10   Number of Visits 12   Date for OT Re-Evaluation 12/11/14   Authorization Type None   OT Start Time 0933   OT Stop Time 1025   OT Time Calculation (min) 52 min   Activity Tolerance Patient tolerated treatment well   Behavior During Therapy [facility] for tasks assessed/performed      Past Medical History  Diagnosis Date  . Type II or unspecified type diabetes mellitus without mention of complication, uncontrolled   . Other and unspecified hyperlipidemia   . Unspecified essential hypertension   . Obesity, unspecified     No past surgical history on file.  There were no vitals filed for this visit.  Visit Diagnosis:  Pain in left shoulder  Tight fascia  Decreased strength  Stiffness of joint, shoulder region, left      Subjective Assessment - 11/16/14 1029    Subjective  S: I still have a hard time reaching up overhead and out to the side.    Patient is accompained by: Interpreter   Currently in Pain? Yes   Pain Score 5    Pain Location Shoulder   Pain Orientation Left   Pain Descriptors / Indicators Aching   Pain Type Chronic pain            [facility] OT Assessment - 11/16/14 0950    Assessment   Diagnosis adhesive capsuliltis of left shoulder   Precautions   Precautions None   AROM   Overall AROM Comments Assessed seated, ER/IR adducted   AROM Assessment Site Shoulder   Right/Left Shoulder Left   Left Shoulder Flexion 126 Degrees  on eval: 101   Left Shoulder ABduction 115 Degrees  on eval: 100   Left Shoulder Internal Rotation 90 Degrees  on eval; 90   Left  Shoulder External Rotation 41 Degrees  on eval: 18   PROM   Overall PROM Comments Assessed in supine, ER/IR adducted   PROM Assessment Site Shoulder   Right/Left Shoulder Left   Left Shoulder Flexion 126 Degrees  on eval: 115   Left Shoulder ABduction 95 Degrees  on eval: 79   Left Shoulder Internal Rotation 90 Degrees  on eval: 90   Left Shoulder External Rotation 60 Degrees  on eval: 16   Strength   Overall Strength Comments Assessed seated. IR/ER adducted. Strength was not assessed on evaluation.   Strength Assessment Site Shoulder   Right/Left Shoulder Left   Left Shoulder Flexion 3/5   Left Shoulder ABduction 3+/5   Left Shoulder Internal Rotation 3/5   Left Shoulder External Rotation 4/5                  OT Treatments/Exercises (OP) - 11/16/14 1031    Exercises   Exercises Shoulder   Shoulder Exercises: Supine   Protraction PROM;5 reps;AROM;10 reps   Horizontal ABduction PROM;5 reps;AROM;10 reps   External Rotation PROM;5 reps;AROM;10 reps   Internal Rotation PROM;5 reps;AROM;10 reps   Flexion PROM;5 reps;AROM;10 reps   ABduction PROM;5 reps;AROM;10 reps   Shoulder Exercises: Standing   Protraction AROM;10 reps   Horizontal ABduction  AROM;10 reps   External Rotation AROM;10 reps   Internal Rotation AROM;10 reps   Flexion AROM;10 reps   ABduction AROM;10 reps   Shoulder Exercises: Stretch   Table Stretch - External Rotation 3 reps;10 seconds   Manual Therapy   Manual Therapy Myofascial release;Muscle Energy Technique   Myofascial Release Myofascial release to left upper arm, trapezius, and scapularis region to decrease fascial restrictions and increase jont mobility in a pain free zone.    Muscle Energy Technique Muscle energy technique to left anterior deltoid to relax tone and and muscle spasm and improve range of motion.                OT Education - 11/16/14 1012    Education provided Yes   Education Details Reviewed goals and progress  in therapy. Educated patient to complete ER stretch using countertop. Added AROM exercises to HEP.   Person(s) Educated Patient   Methods Explanation;Demonstration;Handout   Comprehension Verbalized understanding;Returned demonstration          OT Short Term Goals - 11/16/14 0959    OT SHORT TERM GOAL #1   Title Pt will be educated on HEP.    Status Achieved   OT SHORT TERM GOAL #2   Title Pt will decrease fascial restrictions from max to mod amounts.    Status Achieved   OT SHORT TERM GOAL #3   Title Pt will decrease pain to 4/10 or less during daily tasks.    Status On-going   OT SHORT TERM GOAL #4   Title Pt will increase AROM to [facility] to increase ability to donn shirts.    Status Achieved   OT SHORT TERM GOAL #5   Title Pt will increase strength to 3+/5 to increase ability fix hair.    Status On-going           OT Long Term Goals - 11/16/14 1006    OT LONG TERM GOAL #1   Title Pt will return to highest level of functioning and independence in daily tasks.    Status On-going   OT LONG TERM GOAL #2   Title Pt will decrease fascial restrictions from mod to min amounts or less.    Status On-going   OT LONG TERM GOAL #3   Title Pt will decrease pain to 2/10 or less during daily tasks.    Status On-going   OT LONG TERM GOAL #4   Title Pt will increase AROM to WNL to increase ability to reach into overhead cabinets.    Status On-going   OT LONG TERM GOAL #5   Title Pt will increase strength 4+/5 to increase ability to complete work tasks.    Status On-going               Plan - 11/16/14 1033    Clinical Impression Statement A: Mini reassessment completed this date. patient has met 3/5 STGs and is progressing towards therapy goals. patient reports that she is noticing improvement. She is able to raise her arm higher. She continues to have difficulty with unhooking her bra and fixing her hair. Progressed to AROM supine and standing. patient  tolerated well. AROM  exercises given as HEP.   Plan P: Add UBE and X to V arms.        Problem List Patient Active Problem List   Diagnosis Date Noted  . Chest pain 08/06/2012  . Diabetes mellitus 08/06/2012  . HTN (hypertension) 08/06/2012    Ailene Ravel, OTR/L,CBIS  [phone]  11/16/2014, 10:38 AM  [place] [address] [place], [place], [address] Phone: [phone]   Fax:  [phone]

## 2014-11-16 NOTE — Patient Instructions (Signed)
Continue therapy

## 2014-11-16 NOTE — Progress Notes (Signed)
Patient ID: Yesenia Hoffman, female   DOB: April 05, 1966, 49 y.o.   MRN: [id number] Patient ID: Yesenia Hoffman, female   DOB: [birth year], 49 y.o.   MRN: [id number]  Follow up visit  Chief Complaint  Patient presents with  . Follow-up    6 week follow up left shoulder     Ht 5\' 3"  (1.6 m)  Wt 157 lb (71.215 kg)  BMI 27.82 kg/m2  LMP 01/16/2014  Encounter Diagnosis  Name Primary?  . Adhesive capsulitis of left shoulder Yes    Follow-up for adhesive capsulitis. 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   Past Medical History  Diagnosis Date  . Type II or unspecified type diabetes mellitus without mention of complication, uncontrolled   . Other and unspecified hyperlipidemia   . Unspecified essential hypertension   . Obesity, unspecified     We injected her shoulder subacromial space and then sent her for physical therapy to address adhesive capsulitis she is on naproxen Flexeril and hydrocodone.  She reports that after treatment and therapy and the above-mentioned measures she has improved motion and decrease pain  For elevation 120 external rotation 45. She does report weakness in the rotator cuff.  She also complains of pain over the A1 pulley of the right thumb with catching locking and today she can extend the thumb. Symptoms present in the right thumb 1 week.  Exam shows tenderness over the A1 pulley DIP joint is locked in flexion no deformity basal joint nontender  We injected the trigger thumb  Right Trigger thumb injection Medication  1 mL of 40 mg Depo-Medrol  2 mL of 1% lidocaine plain  Ethyl chloride for anesthesia  Verbal consent was obtained timeout was taken to confirm the  injection site as right thumb  Alcohol was used to prepare the skin along with ethyl chloride and then the injection was made at the A1 pulley there were no complications

## 2014-11-17 ENCOUNTER — Encounter (HOSPITAL_COMMUNITY): Payer: No Typology Code available for payment source

## 2014-11-21 ENCOUNTER — Encounter (HOSPITAL_COMMUNITY): Payer: Self-pay | Admitting: Occupational Therapy

## 2014-11-21 ENCOUNTER — Ambulatory Visit (HOSPITAL_COMMUNITY): Payer: No Typology Code available for payment source | Admitting: Occupational Therapy

## 2014-11-21 DIAGNOSIS — M6289 Other specified disorders of muscle: Secondary | ICD-10-CM

## 2014-11-21 DIAGNOSIS — M25612 Stiffness of left shoulder, not elsewhere classified: Secondary | ICD-10-CM

## 2014-11-21 DIAGNOSIS — M25512 Pain in left shoulder: Secondary | ICD-10-CM

## 2014-11-21 DIAGNOSIS — M629 Disorder of muscle, unspecified: Secondary | ICD-10-CM

## 2014-11-21 DIAGNOSIS — R531 Weakness: Secondary | ICD-10-CM

## 2014-11-21 NOTE — Therapy (Signed)
[place], [place], [address] Phone: [phone]   Fax:  [phone]  Occupational Therapy Treatment  Patient Details  Name: Yesenia Hoffman MRN: [id number] Date of Birth: 1965/08/07 Referring Provider:  Carole Civil, MD  Encounter Date: 11/21/2014      OT End of Session - 11/21/14 1620    Visit Number 11   Number of Visits 12   Date for OT Re-Evaluation 12/11/14   Authorization Type None   OT Start Time 1346   OT Stop Time 1426   OT Time Calculation (min) 40 min   Activity Tolerance Patient tolerated treatment well   Behavior During Therapy [facility] for tasks assessed/performed      Past Medical History  Diagnosis Date  . Type II or unspecified type diabetes mellitus without mention of complication, uncontrolled   . Other and unspecified hyperlipidemia   . Unspecified essential hypertension   . Obesity, unspecified     No past surgical history on file.  There were no vitals filed for this visit.  Visit Diagnosis:  Pain in left shoulder  Tight fascia  Decreased strength  Stiffness of joint, shoulder region, left  Decreased range of motion of left shoulder      Subjective Assessment - 11/21/14 1346    Subjective  S: There is a little pain right here. (triceps region)   Currently in Pain? Yes   Pain Score 3    Pain Location Shoulder   Pain Orientation Left   Pain Descriptors / Indicators Aching   Pain Type Chronic pain            [facility] OT Assessment - 11/21/14 1619    Assessment   Diagnosis adhesive capsuliltis of left shoulder   Precautions   Precautions None                  OT Treatments/Exercises (OP) - 11/21/14 1348    Exercises   Exercises Shoulder   Shoulder Exercises: Supine   Protraction PROM;5 reps;AROM;12 reps   Horizontal ABduction PROM;5 reps;AROM;12 reps   External Rotation PROM;5 reps;AROM;12 reps   Internal Rotation PROM;5 reps;AROM;12 reps   Flexion PROM;5 reps;AROM;12 reps   ABduction PROM;5 reps;AROM;12 reps   Shoulder Exercises: Standing   Protraction AROM;12 reps   Horizontal ABduction AROM;12 reps   External Rotation AROM;12 reps   Internal Rotation AROM;12 reps   Flexion AROM;12 reps   ABduction AROM;12 reps   Shoulder Exercises: ROM/Strengthening   UBE (Upper Arm Bike) UBE Level 1 2' forward 2' reverse   Wall Wash 2'  flexion & abduction   X to V Arms 10X   Proximal Shoulder Strengthening, Supine 12X no rest breaks   Proximal Shoulder Strengthening, Seated 12X no rest breaks   Manual Therapy   Manual Therapy Myofascial release;Muscle Energy Technique   Myofascial Release Myofascial release to left upper arm, trapezius, and scapularis region to decrease fascial restrictions and increase jont mobility in a pain free zone.    Muscle Energy Technique Muscle energy technique to left anterior deltoid to relax tone and and muscle spasm and improve range of motion.                  OT Short Term Goals - 11/16/14 0959    OT SHORT TERM GOAL #1   Title Pt will be educated on HEP.    Status Achieved   OT SHORT TERM GOAL #2   Title Pt will decrease  fascial restrictions from max to mod amounts.    Status Achieved   OT SHORT TERM GOAL #3   Title Pt will decrease pain to 4/10 or less during daily tasks.    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    Status Achieved   OT SHORT TERM GOAL #5   Title Pt will increase strength to 3+/5 to increase ability fix hair.    Status On-going           OT Long Term Goals - 11/16/14 1006    OT LONG TERM GOAL #1   Title Pt will return to highest level of functioning and independence in daily tasks.    Status On-going   OT LONG TERM GOAL #2   Title Pt will decrease fascial restrictions from mod to min amounts or less.    Status On-going   OT LONG TERM GOAL #3   Title Pt will decrease pain to 2/10  or less during daily tasks.    Status On-going   OT LONG TERM GOAL #4   Title Pt will increase AROM to WNL to increase ability to reach into overhead cabinets.    Status On-going   OT LONG TERM GOAL #5   Title Pt will increase strength 4+/5 to increase ability to complete work tasks.    Status On-going               Plan - 11/21/14 1621    Clinical Impression Statement A: No interpreter present this session. Added UBE and x to v arms this session. Pt had no complaints of increased pain. Pt continues to be limited in PROM this session, approximately 60% range.    Plan P: Wall stretches-flexion & abduction. Resume scapular theraband. Increase UBE to 6' total if able to tolerate.         Problem List Patient Active Problem List   Diagnosis Date Noted  . Chest pain 08/06/2012  . Diabetes mellitus 08/06/2012  . HTN (hypertension) 08/06/2012    Guadelupe Sabin, OTR/L  [phone]  11/21/2014, 4:23 PM  [place], [place], [address] Phone: [phone]   Fax:  [phone]

## 2014-11-23 ENCOUNTER — Encounter (HOSPITAL_COMMUNITY): Payer: Self-pay

## 2014-11-23 ENCOUNTER — Ambulatory Visit (HOSPITAL_COMMUNITY): Payer: No Typology Code available for payment source

## 2014-11-23 DIAGNOSIS — M6289 Other specified disorders of muscle: Secondary | ICD-10-CM

## 2014-11-23 DIAGNOSIS — R531 Weakness: Secondary | ICD-10-CM

## 2014-11-23 DIAGNOSIS — M25612 Stiffness of left shoulder, not elsewhere classified: Secondary | ICD-10-CM

## 2014-11-23 DIAGNOSIS — M629 Disorder of muscle, unspecified: Secondary | ICD-10-CM

## 2014-11-23 NOTE — Patient Instructions (Signed)
ROM: Flexion (Alternate)   Deslice el brazo derecho sobre la pared, palma hacia fuera, inclinndose hacia la pared. Sostenga _10___ segundos. Repita __3__ Vicenta Aly por rutina.    Closed Chain: Shoulder Abduction / Adduction - on Wall   [employer] pared, de un paso hacia un lado y regrese. El paso hacia al lado causa la abduccin y aduccin del hombro.

## 2014-11-23 NOTE — Therapy (Signed)
[place], [place], [address] Phone: [phone]   Fax:  [phone]  Occupational Therapy Treatment  Patient Details  Name: Yesenia Hoffman MRN: [id number] Date of Birth: 16-May-1965 Referring Provider:  Carole Civil, MD  Encounter Date: 11/23/2014      OT End of Session - 11/23/14 1510    Visit Number 12   Number of Visits 22   Date for OT Re-Evaluation 12/11/14   Authorization Type None   OT Start Time 1350   OT Stop Time 1430   OT Time Calculation (min) 40 min   Activity Tolerance Patient tolerated treatment well   Behavior During Therapy [facility] for tasks assessed/performed      Past Medical History  Diagnosis Date  . Type II or unspecified type diabetes mellitus without mention of complication, uncontrolled   . Other and unspecified hyperlipidemia   . Unspecified essential hypertension   . Obesity, unspecified     No past surgical history on file.  There were no vitals filed for this visit.  Visit Diagnosis:  Tight fascia  Decreased strength  Stiffness of joint, shoulder region, left      Subjective Assessment - 11/23/14 1409    Subjective  S: When I do that stretch you showed me it hurts a lot.    Currently in Pain? No/denies            [facility] OT Assessment - 11/23/14 1409    Assessment   Diagnosis adhesive capsuliltis of left shoulder   Precautions   Precautions None                  OT Treatments/Exercises (OP) - 11/23/14 1410    Exercises   Exercises Shoulder   Shoulder Exercises: Supine   Protraction PROM;5 reps;AROM;12 reps   Horizontal ABduction PROM;5 reps;AROM;12 reps   External Rotation PROM;5 reps;AROM;12 reps   Internal Rotation PROM;5 reps;AROM;12 reps   Flexion PROM;5 reps;AROM;12 reps   ABduction PROM;5 reps;AROM;12 reps   Shoulder Exercises: Standing   Protraction AROM;12 reps   Horizontal ABduction AROM;12 reps   External Rotation AROM;12  reps   Internal Rotation AROM;12 reps   Flexion AROM;12 reps   ABduction AROM;12 reps   Shoulder Exercises: ROM/Strengthening   Proximal Shoulder Strengthening, Supine 12X no rest breaks   Proximal Shoulder Strengthening, Seated 12X no rest breaks   Manual Therapy   Manual Therapy Myofascial release;Muscle Energy Technique   Myofascial Release Myofascial release to left upper arm, trapezius, and scapularis region to decrease fascial restrictions and increase jont mobility in a pain free zone.    Muscle Energy Technique Muscle energy technique to left anterior deltoid to relax tone and and muscle spasm and improve range of motion.                OT Education - 11/23/14 1509    Education provided Yes   Education Details Shoulder flexion and abduction stretch using wall   Person(s) Educated Patient   Methods Explanation;Demonstration;Handout   Comprehension Verbalized understanding;Returned demonstration          OT Short Term Goals - 11/23/14 1512    OT SHORT TERM GOAL #1   Title Pt will be educated on HEP.    OT SHORT TERM GOAL #2   Title Pt will decrease fascial restrictions from max to mod amounts.    OT SHORT TERM GOAL #3   Title Pt will decrease pain to 4/10 or less  during daily tasks.    Status On-going   OT SHORT TERM GOAL #4   Title Pt will increase AROM to [facility] to increase ability to donn shirts.    OT SHORT TERM GOAL #5   Title Pt will increase strength to 3+/5 to increase ability fix hair.    Status On-going           OT Long Term Goals - 11/16/14 1006    OT LONG TERM GOAL #1   Title Pt will return to highest level of functioning and independence in daily tasks.    Status On-going   OT LONG TERM GOAL #2   Title Pt will decrease fascial restrictions from mod to min amounts or less.    Status On-going   OT LONG TERM GOAL #3   Title Pt will decrease pain to 2/10 or less during daily tasks.    Status On-going   OT LONG TERM GOAL #4   Title Pt will  increase AROM to WNL to increase ability to reach into overhead cabinets.    Status On-going   OT LONG TERM GOAL #5   Title Pt will increase strength 4+/5 to increase ability to complete work tasks.    Status On-going               Plan - 11/23/14 1510    Clinical Impression Statement A: Added wall stretch for flexion and abduction to HEP. Unable to complete all exercises due to time constraint. Pt reports increased pain with passive stretching describing the pain similar to her bone about to break.   Plan P: Resume scapular theraband. Increase UBE to 6' total is able to tolerate.         Problem List Patient Active Problem List   Diagnosis Date Noted  . Chest pain 08/06/2012  . Diabetes mellitus 08/06/2012  . HTN (hypertension) 08/06/2012    Ailene Ravel, OTR/L,CBIS  [phone]  11/23/2014, 3:13 PM  [place] [address] [place], [place], [address] Phone: [phone]   Fax:  [phone]

## 2014-11-28 ENCOUNTER — Ambulatory Visit (HOSPITAL_COMMUNITY): Payer: No Typology Code available for payment source | Attending: Orthopedic Surgery

## 2014-11-28 ENCOUNTER — Encounter (HOSPITAL_COMMUNITY): Payer: Self-pay

## 2014-11-28 DIAGNOSIS — M7582 Other shoulder lesions, left shoulder: Secondary | ICD-10-CM | POA: Insufficient documentation

## 2014-11-28 DIAGNOSIS — M6289 Other specified disorders of muscle: Secondary | ICD-10-CM

## 2014-11-28 DIAGNOSIS — M25512 Pain in left shoulder: Secondary | ICD-10-CM | POA: Insufficient documentation

## 2014-11-28 DIAGNOSIS — M25612 Stiffness of left shoulder, not elsewhere classified: Secondary | ICD-10-CM | POA: Insufficient documentation

## 2014-11-28 DIAGNOSIS — M6281 Muscle weakness (generalized): Secondary | ICD-10-CM | POA: Insufficient documentation

## 2014-11-28 DIAGNOSIS — M629 Disorder of muscle, unspecified: Secondary | ICD-10-CM | POA: Insufficient documentation

## 2014-11-28 DIAGNOSIS — R531 Weakness: Secondary | ICD-10-CM

## 2014-11-28 NOTE — Therapy (Signed)
[place], [place], [address] Phone: [phone]   Fax:  [phone]  Occupational Therapy Treatment  Patient Details  Name: Yesenia Hoffman MRN: [id number] Date of Birth: 03/24/1966 Referring Provider:  Carole Civil, MD  Encounter Date: 11/28/2014      OT End of Session - 11/28/14 1339    Visit Number 13   Number of Visits 22   Date for OT Re-Evaluation 12/11/14   Authorization Type None   OT Start Time 1300   OT Stop Time 1345   OT Time Calculation (min) 45 min   Activity Tolerance Patient tolerated treatment well   Behavior During Therapy [facility] for tasks assessed/performed      Past Medical History  Diagnosis Date  . Type II or unspecified type diabetes mellitus without mention of complication, uncontrolled   . Other and unspecified hyperlipidemia   . Unspecified essential hypertension   . Obesity, unspecified     No past surgical history on file.  There were no vitals filed for this visit.  Visit Diagnosis:  Tight fascia  Decreased strength  Stiffness of joint, shoulder region, left      Subjective Assessment - 11/28/14 1339    Subjective  S: It still feels like it wants to break.    Currently in Pain? No/denies            [facility] OT Assessment - 11/28/14 1338    Assessment   Diagnosis adhesive capsuliltis of left shoulder   Precautions   Precautions None                  OT Treatments/Exercises (OP) - 11/28/14 1320    Exercises   Exercises Shoulder   Shoulder Exercises: Supine   Protraction PROM;5 reps;AROM;15 reps   Horizontal ABduction PROM;5 reps;AROM;15 reps   External Rotation PROM;5 reps;AROM;15 reps   Internal Rotation PROM;5 reps;AROM;15 reps   Flexion PROM;5 reps;AROM;15 reps   ABduction PROM;5 reps;AROM;15 reps   Shoulder Exercises: Standing   Protraction AROM;15 reps   Horizontal ABduction AROM;15 reps   External Rotation AROM;15 reps   Internal  Rotation AROM;15 reps   Flexion AROM;15 reps   ABduction AROM;15 reps   Shoulder Exercises: ROM/Strengthening   UBE (Upper Arm Bike) Level 1 3' forward 3' reverse   X to V Arms 15X   Proximal Shoulder Strengthening, Supine 15X no rest breaks   Proximal Shoulder Strengthening, Seated 15X no rest breaks   Manual Therapy   Manual Therapy Myofascial release;Muscle Energy Technique   Myofascial Release Myofascial release to left upper arm, trapezius, and scapularis region to decrease fascial restrictions and increase jont mobility in a pain free zone.    Muscle Energy Technique Muscle energy technique to left anterior deltoid to relax tone and and muscle spasm and improve range of motion.                  OT Short Term Goals - 11/23/14 1512    OT SHORT TERM GOAL #1   Title Pt will be educated on HEP.    OT SHORT TERM GOAL #2   Title Pt will decrease fascial restrictions from max to mod amounts.    OT SHORT TERM GOAL #3   Title Pt will decrease pain to 4/10 or less during daily tasks.    Status On-going   OT SHORT TERM GOAL #4   Title Pt will increase AROM to [facility] to increase ability to donn  shirts.    OT SHORT TERM GOAL #5   Title Pt will increase strength to 3+/5 to increase ability fix hair.    Status On-going           OT Long Term Goals - 11/16/14 1006    OT LONG TERM GOAL #1   Title Pt will return to highest level of functioning and independence in daily tasks.    Status On-going   OT LONG TERM GOAL #2   Title Pt will decrease fascial restrictions from mod to min amounts or less.    Status On-going   OT LONG TERM GOAL #3   Title Pt will decrease pain to 2/10 or less during daily tasks.    Status On-going   OT LONG TERM GOAL #4   Title Pt will increase AROM to WNL to increase ability to reach into overhead cabinets.    Status On-going   OT LONG TERM GOAL #5   Title Pt will increase strength 4+/5 to increase ability to complete work tasks.    Status On-going                Plan - 11/28/14 1339    Clinical Impression Statement A: Increased time with UBE. Pt continues to report pain along lateral region of left upper arm (deltoid and triceps). Trigger point palpated in left upper arm lateral region.    Plan P: Reusme scapular theraband. Add overhead lacing.        Problem List Patient Active Problem List   Diagnosis Date Noted  . Chest pain 08/06/2012  . Diabetes mellitus 08/06/2012  . HTN (hypertension) 08/06/2012    Ailene Ravel, OTR/L,CBIS  [phone]  11/28/2014, 1:47 PM  [place] [address] [place], [place], [address] Phone: [phone]   Fax:  [phone]

## 2014-11-30 ENCOUNTER — Encounter (HOSPITAL_COMMUNITY): Payer: Self-pay | Admitting: Occupational Therapy

## 2014-11-30 ENCOUNTER — Ambulatory Visit (HOSPITAL_COMMUNITY): Payer: No Typology Code available for payment source | Admitting: Occupational Therapy

## 2014-11-30 DIAGNOSIS — M629 Disorder of muscle, unspecified: Secondary | ICD-10-CM

## 2014-11-30 DIAGNOSIS — M25512 Pain in left shoulder: Secondary | ICD-10-CM

## 2014-11-30 DIAGNOSIS — M25612 Stiffness of left shoulder, not elsewhere classified: Secondary | ICD-10-CM

## 2014-11-30 DIAGNOSIS — R531 Weakness: Secondary | ICD-10-CM

## 2014-11-30 DIAGNOSIS — M6289 Other specified disorders of muscle: Secondary | ICD-10-CM

## 2014-11-30 NOTE — Therapy (Signed)
[place], [place], [address] Phone: [phone]   Fax:  [phone]  Occupational Therapy Treatment  Patient Details  Name: Yesenia Hoffman MRN: [id number] Date of Birth: 11-Nov-1965 Referring Provider:  Carole Civil, MD  Encounter Date: 11/30/2014      OT End of Session - 11/30/14 1520    Visit Number 14   Number of Visits 22   Date for OT Re-Evaluation 12/11/14   Authorization Type None   OT Start Time 1348   OT Stop Time 1428   OT Time Calculation (min) 40 min   Activity Tolerance Patient tolerated treatment well   Behavior During Therapy [facility] for tasks assessed/performed      Past Medical History  Diagnosis Date  . Type II or unspecified type diabetes mellitus without mention of complication, uncontrolled   . Other and unspecified hyperlipidemia   . Unspecified essential hypertension   . Obesity, unspecified     No past surgical history on file.  There were no vitals filed for this visit.  Visit Diagnosis:  Tight fascia  Decreased strength  Stiffness of joint, shoulder region, left  Pain in left shoulder  Decreased range of motion of left shoulder      Subjective Assessment - 11/30/14 1349    Subjective  S: Right now it's good but when I put it up it hurts.    Currently in Pain? No/denies            [facility] OT Assessment - 11/30/14 1518    Assessment   Diagnosis adhesive capsuliltis of left shoulder   Precautions   Precautions None                  OT Treatments/Exercises (OP) - 11/30/14 1404    Exercises   Exercises Shoulder   Shoulder Exercises: Supine   Protraction PROM;5 reps;AROM;15 reps   Horizontal ABduction PROM;5 reps;AROM;15 reps   External Rotation PROM;5 reps;AROM;15 reps   Internal Rotation PROM;5 reps;AROM;15 reps   Flexion PROM;5 reps;AROM;15 reps   ABduction PROM;5 reps;AROM;15 reps   Shoulder Exercises: Standing   Protraction AROM;15 reps    Horizontal ABduction AROM;15 reps   External Rotation AROM;15 reps   Internal Rotation AROM;15 reps   Flexion AROM;15 reps   ABduction AROM;15 reps   Extension Theraband;10 reps   Theraband Level (Shoulder Extension) Level 2 (Red)   Row Theraband;10 reps   Theraband Level (Shoulder Row) Level 2 (Red)   Retraction Theraband;10 reps   Theraband Level (Shoulder Retraction) Level 2 (Red)   Shoulder Exercises: ROM/Strengthening   Over Head Lace 1'   X to V Arms 15X   Proximal Shoulder Strengthening, Supine 15X no rest breaks   Proximal Shoulder Strengthening, Seated 15X no rest breaks   Manual Therapy   Manual Therapy Myofascial release;Muscle Energy Technique   Myofascial Release Myofascial release to left upper arm, trapezius, and scapularis region to decrease fascial restrictions and increase jont mobility in a pain free zone.    Muscle Energy Technique Muscle energy technique to left anterior deltoid to relax tone and and muscle spasm and improve range of motion.                  OT Short Term Goals - 11/23/14 1512    OT SHORT TERM GOAL #1   Title Pt will be educated on HEP.    OT SHORT TERM GOAL #2   Title Pt will decrease fascial restrictions  from max to mod amounts.    OT SHORT TERM GOAL #3   Title Pt will decrease pain to 4/10 or less during daily tasks.    Status On-going   OT SHORT TERM GOAL #4   Title Pt will increase AROM to [facility] to increase ability to donn shirts.    OT SHORT TERM GOAL #5   Title Pt will increase strength to 3+/5 to increase ability fix hair.    Status On-going           OT Long Term Goals - 11/16/14 1006    OT LONG TERM GOAL #1   Title Pt will return to highest level of functioning and independence in daily tasks.    Status On-going   OT LONG TERM GOAL #2   Title Pt will decrease fascial restrictions from mod to min amounts or less.    Status On-going   OT LONG TERM GOAL #3   Title Pt will decrease pain to 2/10 or less during  daily tasks.    Status On-going   OT LONG TERM GOAL #4   Title Pt will increase AROM to WNL to increase ability to reach into overhead cabinets.    Status On-going   OT LONG TERM GOAL #5   Title Pt will increase strength 4+/5 to increase ability to complete work tasks.    Status On-going               Plan - 11/30/14 1520    Clinical Impression Statement A: Added overhead lacing, resumed scapular theraband exercises. Pt reports increased pain during wall stretch in flexion at home. Pt required verbal cuing for proper form and to depress shoulder during standing AROM exercises.    Plan P: Cont scapular theraband, focusing on proper form. Resume UBE, increase overhead lace to 1'30"        Problem List Patient Active Problem List   Diagnosis Date Noted  . Chest pain 08/06/2012  . Diabetes mellitus 08/06/2012  . HTN (hypertension) 08/06/2012    Guadelupe Sabin, OTR/L  [phone]  11/30/2014, 3:23 PM  [facility] [address] [place], [place], [address] Phone: [phone]   Fax:  [phone]

## 2014-12-12 ENCOUNTER — Encounter (HOSPITAL_COMMUNITY): Payer: Self-pay | Admitting: Occupational Therapy

## 2014-12-12 ENCOUNTER — Ambulatory Visit (HOSPITAL_COMMUNITY): Payer: No Typology Code available for payment source | Admitting: Occupational Therapy

## 2014-12-12 DIAGNOSIS — M6289 Other specified disorders of muscle: Secondary | ICD-10-CM

## 2014-12-12 DIAGNOSIS — M25512 Pain in left shoulder: Secondary | ICD-10-CM

## 2014-12-12 DIAGNOSIS — M629 Disorder of muscle, unspecified: Secondary | ICD-10-CM

## 2014-12-12 DIAGNOSIS — M25612 Stiffness of left shoulder, not elsewhere classified: Secondary | ICD-10-CM

## 2014-12-12 DIAGNOSIS — R531 Weakness: Secondary | ICD-10-CM

## 2014-12-12 NOTE — Therapy (Signed)
[place], [place], [address] Phone: [phone]   Fax:  [phone]  Occupational Therapy Reassessment & Treatment  Patient Details  Name: Yesenia Hoffman MRN: [id number] Date of Birth: Jan 29, 1966 Referring Provider:  Soyla Dryer, PA-C  Encounter Date: 12/12/2014      OT End of Session - 12/12/14 1518    Visit Number 15   Number of Visits 22   Date for OT Re-Evaluation 02/10/15  mini reassessment 01/10/2015   Authorization Type None   OT Start Time 1431   OT Stop Time 1511   OT Time Calculation (min) 40 min   Activity Tolerance Patient tolerated treatment well   Behavior During Therapy [facility] for tasks assessed/performed      Past Medical History  Diagnosis Date  . Type II or unspecified type diabetes mellitus without mention of complication, uncontrolled   . Other and unspecified hyperlipidemia   . Unspecified essential hypertension   . Obesity, unspecified     No past surgical history on file.  There were no vitals filed for this visit.  Visit Diagnosis:  Tight fascia  Decreased strength  Stiffness of joint, shoulder region, left  Pain in left shoulder  Decreased range of motion of left shoulder      Subjective Assessment - 12/12/14 1434    Subjective  S:    Currently in Pain? Yes   Pain Score 5    Pain Location Shoulder   Pain Orientation Left   Pain Descriptors / Indicators Aching   Pain Type Chronic pain           [facility] OT Assessment - 12/12/14 1433    Assessment   Diagnosis adhesive capsuliltis of left shoulder   Precautions   Precautions None   AROM   Overall AROM Comments Assessed seated, ER/IR adducted   AROM Assessment Site Shoulder   Right/Left Shoulder Left   Left Shoulder Flexion 129 Degrees  previous 126   Left Shoulder ABduction 105 Degrees  previous 115   Left Shoulder Internal Rotation 90 Degrees  same as previous   Left Shoulder External Rotation 55  Degrees  previous 41   PROM   Overall PROM Comments Assessed in supine, ER/IR adducted   PROM Assessment Site Shoulder   Right/Left Shoulder Left   Left Shoulder Flexion 128 Degrees  previous 126   Left Shoulder ABduction 99 Degrees  previous 95   Left Shoulder Internal Rotation 90 Degrees  same as previous   Left Shoulder External Rotation 51 Degrees  previuos 60   Strength   Left Shoulder Flexion 4/5  previous 3/5   Left Shoulder ABduction 4/5  previous 3+/5   Left Shoulder Internal Rotation 4/5  previous 3/5   Left Shoulder External Rotation 4/5  same as previous                  OT Treatments/Exercises (OP) - 12/12/14 1437    Exercises   Exercises Shoulder   Shoulder Exercises: Supine   Protraction PROM;5 reps   Horizontal ABduction PROM;5 reps   External Rotation PROM;5 reps   Internal Rotation PROM;5 reps   Flexion PROM;5 reps   ABduction PROM;5 reps   Shoulder Exercises: Standing   Protraction AROM;15 reps   Horizontal ABduction AROM;15 reps   External Rotation AROM;15 reps   Internal Rotation AROM;15 reps   Flexion AROM;15 reps   ABduction AROM;15 reps   Shoulder Exercises: ROM/Strengthening   UBE (Upper Arm Bike)  Level 1 3' forward 3' reverse   Over Head Lace 1'30"   X to V Arms 15X   Manual Therapy   Manual Therapy Myofascial release;Muscle Energy Technique   Myofascial Release Myofascial release to left upper arm, trapezius, and scapularis region to decrease fascial restrictions and increase jont mobility in a pain free zone.    Muscle Energy Technique Muscle energy technique to left anterior deltoid to relax tone and and muscle spasm and improve range of motion.                 OT Short Term Goals - 12/12/14 1519    OT SHORT TERM GOAL #1   Title Pt will be educated on HEP.    OT SHORT TERM GOAL #2   Title Pt will decrease fascial restrictions from max to mod amounts.    OT SHORT TERM GOAL #3   Title Pt will decrease pain to  4/10 or less during daily tasks.    Status On-going   OT SHORT TERM GOAL #4   Title Pt will increase AROM to [facility] to increase ability to donn shirts.    OT SHORT TERM GOAL #5   Title Pt will increase strength to 3+/5 to increase ability fix hair.    Status Achieved           OT Long Term Goals - 12/12/14 1520    OT LONG TERM GOAL #1   Title Pt will return to highest level of functioning and independence in daily tasks.    Status On-going   OT LONG TERM GOAL #2   Title Pt will decrease fascial restrictions from mod to min amounts or less.    Status On-going   OT LONG TERM GOAL #3   Title Pt will decrease pain to 2/10 or less during daily tasks.    Status On-going   OT LONG TERM GOAL #4   Title Pt will increase AROM to WNL to increase ability to reach into overhead cabinets.    Status On-going   OT LONG TERM GOAL #5   Title Pt will increase strength 4+/5 to increase ability to complete work tasks.    Status On-going               Plan - 12/12/14 1518    Clinical Impression Statement A: Reassessment completed this session, pt has met 4/5 STGs and is progressing towards LTGs. Pt has improved strength in all planes. Pt reports increased pain due to child jumping on arm Friday. Increased overhead lace to 1'30" and resumed UBE this session.    Plan P: Continue focusing on increasing range of motion to [facility], decreasing pain, and increasing strength in LUE to complete B/IADL tasks at prior level of function.         Problem List Patient Active Problem List   Diagnosis Date Noted  . Chest pain 08/06/2012  . Diabetes mellitus 08/06/2012  . HTN (hypertension) 08/06/2012    Guadelupe Sabin, OTR/L  [phone]  12/12/2014, 3:27 PM  [facility], [place], [address] Phone: [phone]   Fax:  [phone]

## 2014-12-14 ENCOUNTER — Ambulatory Visit (HOSPITAL_COMMUNITY): Payer: No Typology Code available for payment source

## 2014-12-14 ENCOUNTER — Encounter (HOSPITAL_COMMUNITY): Payer: Self-pay

## 2014-12-14 DIAGNOSIS — M629 Disorder of muscle, unspecified: Secondary | ICD-10-CM

## 2014-12-14 DIAGNOSIS — M6289 Other specified disorders of muscle: Secondary | ICD-10-CM

## 2014-12-14 DIAGNOSIS — M25512 Pain in left shoulder: Secondary | ICD-10-CM

## 2014-12-14 DIAGNOSIS — R531 Weakness: Secondary | ICD-10-CM

## 2014-12-14 DIAGNOSIS — M25612 Stiffness of left shoulder, not elsewhere classified: Secondary | ICD-10-CM

## 2014-12-14 NOTE — Therapy (Signed)
[place], [place], [address] Phone: [phone]   Fax:  [phone]  Occupational Therapy Treatment  Patient Details  Name: Yesenia Hoffman MRN: [id number] Date of Birth: [birth year] Referring Provider:  Soyla Dryer, PA-C  Encounter Date: 12/14/2014      OT End of Session - 12/14/14 1558    Visit Number 16   Number of Visits 22   Date for OT Re-Evaluation 02/10/15  mini reassessment 01/10/2015   Authorization Type None   OT Start Time 1520   OT Stop Time 1600   OT Time Calculation (min) 40 min   Activity Tolerance Patient tolerated treatment well   Behavior During Therapy [facility] for tasks assessed/performed      Past Medical History  Diagnosis Date  . Type II or unspecified type diabetes mellitus without mention of complication, uncontrolled   . Other and unspecified hyperlipidemia   . Unspecified essential hypertension   . Obesity, unspecified     No past surgical history on file.  There were no vitals filed for this visit.  Visit Diagnosis:  Tight fascia  Decreased strength  Stiffness of joint, shoulder region, left  Pain in left shoulder      Subjective Assessment - 12/14/14 1541    Subjective  S: It hurts here and here (proximal and distal portion of left bicep)   Patient is accompained by: Interpreter   Currently in Pain? Yes   Pain Score 3    Pain Location Shoulder   Pain Orientation Left   Pain Descriptors / Indicators Aching   Pain Type Chronic pain                      OT Treatments/Exercises (OP) - 12/14/14 1522    Exercises   Exercises Shoulder   Shoulder Exercises: Supine   Protraction PROM;Strengthening;10 reps   Protraction Weight (lbs) 1   Horizontal ABduction PROM;AROM;10 reps   Horizontal ABduction Weight (lbs) 1   External Rotation PROM;Strengthening;10 reps   External Rotation Weight (lbs) 1   Internal Rotation PROM;Strengthening;10 reps   Internal Rotation Weight (lbs) 1   Flexion PROM;Strengthening;10 reps   Shoulder Flexion Weight (lbs) 1   ABduction PROM;Strengthening;10 reps   Shoulder ABduction Weight (lbs) 1   Shoulder Exercises: Prone   Other Prone Exercises [place] exercises; 10X   Other Prone Exercises scapular raises; 10X   Shoulder Exercises: Standing   Protraction Strengthening;10 reps   Protraction Weight (lbs) 1   Horizontal ABduction Strengthening;10 reps   Horizontal ABduction Weight (lbs) 1   Flexion Strengthening;10 reps   Shoulder Flexion Weight (lbs) 1   ABduction Strengthening;10 reps   Shoulder ABduction Weight (lbs) 1   Manual Therapy   Manual Therapy Myofascial release;Muscle Energy Technique   Myofascial Release Myofascial release to left upper arm, trapezius, and scapularis region to decrease fascial restrictions and increase jont mobility in a pain free zone.    Muscle Energy Technique Muscle energy technique to left anterior deltoid to relax tone and and muscle spasm and improve range of motion.                  OT Short Term Goals - 12/12/14 1519    OT SHORT TERM GOAL #1   Title Pt will be educated on HEP.    OT SHORT TERM GOAL #2   Title Pt will decrease fascial restrictions from max to mod amounts.    OT SHORT TERM GOAL #  3   Title Pt will decrease pain to 4/10 or less during daily tasks.    Status On-going   OT SHORT TERM GOAL #4   Title Pt will increase AROM to [facility] to increase ability to donn shirts.    OT SHORT TERM GOAL #5   Title Pt will increase strength to 3+/5 to increase ability fix hair.    Status Achieved           OT Long Term Goals - 12/12/14 1520    OT LONG TERM GOAL #1   Title Pt will return to highest level of functioning and independence in daily tasks.    Status On-going   OT LONG TERM GOAL #2   Title Pt will decrease fascial restrictions from mod to min amounts or less.    Status On-going   OT LONG TERM GOAL #3   Title Pt will decrease pain  to 2/10 or less during daily tasks.    Status On-going   OT LONG TERM GOAL #4   Title Pt will increase AROM to WNL to increase ability to reach into overhead cabinets.    Status On-going   OT LONG TERM GOAL #5   Title Pt will increase strength 4+/5 to increase ability to complete work tasks.    Status On-going               Plan - 12/14/14 1638    Clinical Impression Statement A: Added 1# hand weight to supine and standing exercises to increase pull and stretch. Also added hughston and prone exercises. Pt tolerated wel. Completed ER and IR stretching with shoulder abducted and presented with better results.    Plan P: Cont with strengthening exercises. Add ball on the wall.         Problem List Patient Active Problem List   Diagnosis Date Noted  . Chest pain 08/06/2012  . Diabetes mellitus 08/06/2012  . HTN (hypertension) 08/06/2012    Ailene Ravel, OTR/L,CBIS  [phone]  12/14/2014, 4:43 PM  [facility] [address] [place], [place], [address] Phone: [phone]   Fax:  [phone]

## 2014-12-19 ENCOUNTER — Encounter (HOSPITAL_COMMUNITY): Payer: Self-pay

## 2014-12-19 ENCOUNTER — Ambulatory Visit (HOSPITAL_COMMUNITY): Payer: No Typology Code available for payment source

## 2014-12-19 DIAGNOSIS — M25512 Pain in left shoulder: Secondary | ICD-10-CM

## 2014-12-19 DIAGNOSIS — M629 Disorder of muscle, unspecified: Secondary | ICD-10-CM

## 2014-12-19 DIAGNOSIS — R531 Weakness: Secondary | ICD-10-CM

## 2014-12-19 DIAGNOSIS — M25612 Stiffness of left shoulder, not elsewhere classified: Secondary | ICD-10-CM

## 2014-12-19 DIAGNOSIS — M6289 Other specified disorders of muscle: Secondary | ICD-10-CM

## 2014-12-19 NOTE — Therapy (Signed)
[place], [place], [address] Phone: [phone]   Fax:  [phone]  Occupational Therapy Treatment  Patient Details  Name: Yesenia Hoffman MRN: [id number] Date of Birth: 02-29-1966 Referring Provider:  Carole Civil, MD  Encounter Date: 12/19/2014      OT End of Session - 12/19/14 1426    Visit Number 17   Number of Visits 22   Date for OT Re-Evaluation 02/10/15  mini reassessment 01/10/2015   Authorization Type None   OT Start Time 1345   OT Stop Time 1430   OT Time Calculation (min) 45 min   Activity Tolerance Patient tolerated treatment well   Behavior During Therapy [facility] for tasks assessed/performed      Past Medical History  Diagnosis Date  . Type II or unspecified type diabetes mellitus without mention of complication, uncontrolled   . Other and unspecified hyperlipidemia   . Unspecified essential hypertension   . Obesity, unspecified     No past surgical history on file.  There were no vitals filed for this visit.  Visit Diagnosis:  Decreased strength  Tight fascia  Stiffness of joint, shoulder region, left  Pain in left shoulder      Subjective Assessment - 12/19/14 1408    Subjective  S: I fell on friday when i was sweeping a wet floor from the rain.    Currently in Pain? Yes   Pain Score 4    Pain Location Shoulder   Pain Orientation Left   Pain Descriptors / Indicators Aching   Pain Type Chronic pain            [facility] OT Assessment - 12/19/14 1409    Assessment   Diagnosis adhesive capsuliltis of left shoulder   Precautions   Precautions None                  OT Treatments/Exercises (OP) - 12/19/14 1409    Exercises   Exercises Shoulder   Shoulder Exercises: Supine   Protraction PROM;Strengthening;10 reps   Protraction Weight (lbs) 1   Horizontal ABduction PROM;AROM;10 reps   Horizontal ABduction Weight (lbs) 1   External Rotation  PROM;Strengthening;10 reps   External Rotation Weight (lbs) 1   Internal Rotation PROM;Strengthening;10 reps   Internal Rotation Weight (lbs) 1   Flexion PROM;Strengthening;10 reps   Shoulder Flexion Weight (lbs) 1   ABduction PROM;Strengthening;10 reps   Shoulder ABduction Weight (lbs) 1   Shoulder Exercises: Standing   Protraction Strengthening;10 reps   Protraction Weight (lbs) 1   Horizontal ABduction Strengthening;10 reps   Horizontal ABduction Weight (lbs) 1   External Rotation Strengthening;10 reps   External Rotation Weight (lbs) 1   Internal Rotation Strengthening;10 reps   Internal Rotation Weight (lbs) 1   Flexion Strengthening;10 reps   Shoulder Flexion Weight (lbs) 1   ABduction Strengthening;10 reps   Shoulder ABduction Weight (lbs) 1   Shoulder Exercises: ROM/Strengthening   UBE (Upper Arm Bike) Level 1 3' forward 3' reverse   Over Head Lace 1'30"   X to V Arms 10X with 1#   Proximal Shoulder Strengthening, Supine 10X with 1# no rest breaks   Proximal Shoulder Strengthening, Seated 10X with 1# with rest breaks   Ball on Wall 1' flexion 1' abduction green ball   Manual Therapy   Manual Therapy Myofascial release;Muscle Energy Technique   Myofascial Release Myofascial release to left upper arm, trapezius, and scapularis region to decrease fascial restrictions and  increase jont mobility in a pain free zone.    Muscle Energy Technique Muscle energy technique to left anterior deltoid to relax tone and and muscle spasm and improve range of motion.                  OT Short Term Goals - 12/12/14 1519    OT SHORT TERM GOAL #1   Title Pt will be educated on HEP.    OT SHORT TERM GOAL #2   Title Pt will decrease fascial restrictions from max to mod amounts.    OT SHORT TERM GOAL #3   Title Pt will decrease pain to 4/10 or less during daily tasks.    Status On-going   OT SHORT TERM GOAL #4   Title Pt will increase AROM to [facility] to increase ability to donn  shirts.    OT SHORT TERM GOAL #5   Title Pt will increase strength to 3+/5 to increase ability fix hair.    Status Achieved           OT Long Term Goals - 12/12/14 1520    OT LONG TERM GOAL #1   Title Pt will return to highest level of functioning and independence in daily tasks.    Status On-going   OT LONG TERM GOAL #2   Title Pt will decrease fascial restrictions from mod to min amounts or less.    Status On-going   OT LONG TERM GOAL #3   Title Pt will decrease pain to 2/10 or less during daily tasks.    Status On-going   OT LONG TERM GOAL #4   Title Pt will increase AROM to WNL to increase ability to reach into overhead cabinets.    Status On-going   OT LONG TERM GOAL #5   Title Pt will increase strength 4+/5 to increase ability to complete work tasks.    Status On-going               Plan - 12/19/14 1426    Clinical Impression Statement A: Pt reports that she feel Friday while sweeping the wet floor at home. She had some pain in left shoulder but it was nothing like it was in the beginning. Added ball on the wall. Patient tolerated well with reports of muscle fatigue towards the end of each minute.    Plan P: Increase supine repetitions to 12X. Cont with hughstone exercises and scapular strengthening.         Problem List Patient Active Problem List   Diagnosis Date Noted  . Chest pain 08/06/2012  . Diabetes mellitus 08/06/2012  . HTN (hypertension) 08/06/2012    Ailene Ravel, OTR/L,CBIS  [phone]  12/19/2014, 2:29 PM  [facility] [address] [place], [place], [address] Phone: [phone]   Fax:  [phone]

## 2014-12-21 ENCOUNTER — Encounter (HOSPITAL_COMMUNITY): Payer: Self-pay | Admitting: Occupational Therapy

## 2014-12-21 ENCOUNTER — Ambulatory Visit (HOSPITAL_COMMUNITY): Payer: No Typology Code available for payment source | Admitting: Occupational Therapy

## 2014-12-21 DIAGNOSIS — M6289 Other specified disorders of muscle: Secondary | ICD-10-CM

## 2014-12-21 DIAGNOSIS — R531 Weakness: Secondary | ICD-10-CM

## 2014-12-21 DIAGNOSIS — M25512 Pain in left shoulder: Secondary | ICD-10-CM

## 2014-12-21 DIAGNOSIS — M25612 Stiffness of left shoulder, not elsewhere classified: Secondary | ICD-10-CM

## 2014-12-21 DIAGNOSIS — M629 Disorder of muscle, unspecified: Secondary | ICD-10-CM

## 2014-12-21 NOTE — Therapy (Signed)
[place], [place], [address] Phone: [phone]   Fax:  [phone]  Occupational Therapy Treatment  Patient Details  Name: Yesenia Hoffman MRN: [id number] Date of Birth: 02-Jul-1965 Referring Provider:  Carole Civil, MD  Encounter Date: 12/21/2014      OT End of Session - 12/21/14 1348    Visit Number 18   Number of Visits 22   Date for OT Re-Evaluation 02/10/15  mini reassessment 01/10/2015   Authorization Type None   OT Start Time 1300   OT Stop Time 1346   OT Time Calculation (min) 46 min   Activity Tolerance Patient tolerated treatment well   Behavior During Therapy [facility] for tasks assessed/performed      Past Medical History  Diagnosis Date  . Type II or unspecified type diabetes mellitus without mention of complication, uncontrolled   . Other and unspecified hyperlipidemia   . Unspecified essential hypertension   . Obesity, unspecified     No past surgical history on file.  There were no vitals filed for this visit.  Visit Diagnosis:  Decreased strength  Tight fascia  Stiffness of joint, shoulder region, left  Pain in left shoulder  Decreased range of motion of left shoulder      Subjective Assessment - 12/21/14 1256    Subjective  S: I'm sore today.    Patient is accompained by: Interpreter   Currently in Pain? Yes   Pain Score 6    Pain Location Shoulder   Pain Orientation Left   Pain Descriptors / Indicators Aching   Pain Type Chronic pain            [facility] OT Assessment - 12/21/14 1255    Assessment   Diagnosis adhesive capsuliltis of left shoulder   Precautions   Precautions None                  OT Treatments/Exercises (OP) - 12/21/14 1303    Exercises   Exercises Shoulder   Shoulder Exercises: Supine   Protraction PROM;Strengthening;10 reps   Protraction Weight (lbs) 1   Horizontal ABduction PROM;AROM;10 reps   Horizontal ABduction Weight  (lbs) 1   External Rotation PROM;Strengthening;10 reps   External Rotation Weight (lbs) 1   Internal Rotation PROM;Strengthening;10 reps   Internal Rotation Weight (lbs) 1   Flexion PROM;Strengthening;10 reps   Shoulder Flexion Weight (lbs) 1   ABduction PROM;Strengthening;10 reps   Shoulder ABduction Weight (lbs) 1   Shoulder Exercises: Prone   Other Prone Exercises Hughston exercises; 10X   Shoulder Exercises: Standing   Protraction Strengthening;10 reps   Protraction Weight (lbs) 1   Horizontal ABduction Strengthening;10 reps   Horizontal ABduction Weight (lbs) 1   External Rotation Strengthening;10 reps   External Rotation Weight (lbs) 1   Internal Rotation Strengthening;10 reps   Internal Rotation Weight (lbs) 1   Flexion Strengthening;10 reps   Shoulder Flexion Weight (lbs) 1   ABduction Strengthening;10 reps   Shoulder ABduction Weight (lbs) 1   Shoulder Exercises: ROM/Strengthening   UBE (Upper Arm Bike) Level 1 3' forward 3' reverse   Over Head Lace 2'   X to V Arms 10X with 1#   Proximal Shoulder Strengthening, Supine 10X with 1# no rest breaks   Proximal Shoulder Strengthening, Seated 10X with 1# with rest breaks   Ball on Wall 1' flexion 1' abduction green ball   Manual Therapy   Manual Therapy Myofascial release;Muscle Energy Technique  Myofascial Release Myofascial release to left upper arm, trapezius, and scapularis region to decrease fascial restrictions and increase jont mobility in a pain free zone.    Muscle Energy Technique Muscle energy technique to left anterior deltoid to relax tone and and muscle spasm and improve range of motion.                  OT Short Term Goals - 12/12/14 1519    OT SHORT TERM GOAL #1   Title Pt will be educated on HEP.    OT SHORT TERM GOAL #2   Title Pt will decrease fascial restrictions from max to mod amounts.    OT SHORT TERM GOAL #3   Title Pt will decrease pain to 4/10 or less during daily tasks.    Status  On-going   OT SHORT TERM GOAL #4   Title Pt will increase AROM to [facility] to increase ability to donn shirts.    OT SHORT TERM GOAL #5   Title Pt will increase strength to 3+/5 to increase ability fix hair.    Status Achieved           OT Long Term Goals - 12/12/14 1520    OT LONG TERM GOAL #1   Title Pt will return to highest level of functioning and independence in daily tasks.    Status On-going   OT LONG TERM GOAL #2   Title Pt will decrease fascial restrictions from mod to min amounts or less.    Status On-going   OT LONG TERM GOAL #3   Title Pt will decrease pain to 2/10 or less during daily tasks.    Status On-going   OT LONG TERM GOAL #4   Title Pt will increase AROM to WNL to increase ability to reach into overhead cabinets.    Status On-going   OT LONG TERM GOAL #5   Title Pt will increase strength 4+/5 to increase ability to complete work tasks.    Status On-going               Plan - 12/21/14 1348    Clinical Impression Statement A: Increased supine repetitions to 12, resumed prone hughston exercises and ball on wall. Pt expresses fatigue at end of session.    Plan P: Add cybex row/press if able to tolerate.         Problem List Patient Active Problem List   Diagnosis Date Noted  . Chest pain 08/06/2012  . Diabetes mellitus 08/06/2012  . HTN (hypertension) 08/06/2012    Guadelupe Sabin, OTR/L  [phone]  12/21/2014, 1:50 PM  [place] [address] [place], [place], [address] Phone: [phone]   Fax:  [phone]

## 2014-12-26 ENCOUNTER — Ambulatory Visit (HOSPITAL_COMMUNITY): Payer: No Typology Code available for payment source | Admitting: Occupational Therapy

## 2014-12-26 ENCOUNTER — Ambulatory Visit (HOSPITAL_COMMUNITY)
Admission: RE | Admit: 2014-12-26 | Discharge: 2014-12-26 | Disposition: A | Discharge status: Home / Self Care | Payer: Self-pay | Source: Ambulatory Visit | Attending: Physician Assistant | Admitting: Physician Assistant

## 2014-12-26 ENCOUNTER — Encounter (HOSPITAL_COMMUNITY): Payer: Self-pay | Admitting: Occupational Therapy

## 2014-12-26 ENCOUNTER — Other Ambulatory Visit: Payer: Self-pay | Admitting: Physician Assistant

## 2014-12-26 DIAGNOSIS — M629 Disorder of muscle, unspecified: Secondary | ICD-10-CM

## 2014-12-26 DIAGNOSIS — R531 Weakness: Secondary | ICD-10-CM

## 2014-12-26 DIAGNOSIS — R509 Fever, unspecified: Secondary | ICD-10-CM | POA: Insufficient documentation

## 2014-12-26 DIAGNOSIS — R059 Cough, unspecified: Secondary | ICD-10-CM

## 2014-12-26 DIAGNOSIS — M25512 Pain in left shoulder: Secondary | ICD-10-CM

## 2014-12-26 DIAGNOSIS — R05 Cough: Secondary | ICD-10-CM | POA: Insufficient documentation

## 2014-12-26 DIAGNOSIS — M6289 Other specified disorders of muscle: Secondary | ICD-10-CM

## 2014-12-26 DIAGNOSIS — M25612 Stiffness of left shoulder, not elsewhere classified: Secondary | ICD-10-CM

## 2014-12-26 NOTE — Therapy (Signed)
[place], [place], [address] Phone: [phone]   Fax:  [phone]  Occupational Therapy Treatment  Patient Details  Name: Yesenia Hoffman MRN: [id number] Date of Birth: 05-18-1965 Referring Provider:  Carole Civil, MD  Encounter Date: 12/26/2014      OT End of Session - 12/26/14 1334    Visit Number 19   Number of Visits 22   Date for OT Re-Evaluation 02/10/15  mini reassessment 01/10/2015   Authorization Type None   OT Start Time 1301   OT Stop Time 1330   OT Time Calculation (min) 29 min   Activity Tolerance Patient tolerated treatment well   Behavior During Therapy [facility] for tasks assessed/performed      Past Medical History  Diagnosis Date  . Type II or unspecified type diabetes mellitus without mention of complication, uncontrolled   . Other and unspecified hyperlipidemia   . Unspecified essential hypertension   . Obesity, unspecified     No past surgical history on file.  There were no vitals filed for this visit.  Visit Diagnosis:  Decreased strength  Tight fascia  Stiffness of joint, shoulder region, left  Pain in left shoulder      Subjective Assessment - 12/26/14 1302    Subjective  S: My neck is hurting the most today.    Currently in Pain? Yes   Pain Score 3    Pain Location Shoulder   Pain Orientation Left   Pain Descriptors / Indicators Aching   Pain Type Chronic pain           [facility] OT Assessment - 12/26/14 1319    Assessment   Diagnosis adhesive capsuliltis of left shoulder   Precautions   Precautions None   AROM   Overall AROM Comments Assessed seated, ER/IR adducted   AROM Assessment Site Shoulder   Right/Left Shoulder Left   Left Shoulder Flexion 129 Degrees  same as previous   Left Shoulder ABduction 124 Degrees  105 previous   Left Shoulder Internal Rotation 90 Degrees  same as previous   Left Shoulder External Rotation 45 Degrees  55 previous    PROM   Overall PROM Comments Assessed in supine, ER/IR adducted   PROM Assessment Site Shoulder   Right/Left Shoulder Left   Left Shoulder Flexion 135 Degrees  128 previous   Left Shoulder ABduction 95 Degrees  99 previous   Left Shoulder Internal Rotation 90 Degrees  same as previous   Left Shoulder External Rotation 60 Degrees  51 previous   Strength   Left Shoulder Flexion 4/5  same as previous   Left Shoulder ABduction 4/5  same as previous   Left Shoulder Internal Rotation 4/5  same as previous   Left Shoulder External Rotation 4/5  same as previous                  OT Treatments/Exercises (OP) - 12/26/14 1306    Exercises   Exercises Shoulder   Shoulder Exercises: Supine   Protraction PROM;10 reps   Horizontal ABduction PROM;10 reps   External Rotation PROM;10 reps   Internal Rotation PROM;10 reps   Flexion PROM;10 reps   ABduction PROM;10 reps   Manual Therapy   Manual Therapy Myofascial release;Muscle Energy Technique   Myofascial Release Myofascial release to left upper arm, trapezius, and scapularis region to decrease fascial restrictions and increase jont mobility in a pain free zone.    Muscle Energy Technique Muscle energy  technique to left anterior deltoid to relax tone and and muscle spasm and improve range of motion.                 OT Short Term Goals - 12/12/14 1519    OT SHORT TERM GOAL #1   Title Pt will be educated on HEP.    OT SHORT TERM GOAL #2   Title Pt will decrease fascial restrictions from max to mod amounts.    OT SHORT TERM GOAL #3   Title Pt will decrease pain to 4/10 or less during daily tasks.    Status On-going   OT SHORT TERM GOAL #4   Title Pt will increase AROM to [facility] to increase ability to donn shirts.    OT SHORT TERM GOAL #5   Title Pt will increase strength to 3+/5 to increase ability fix hair.    Status Achieved           OT Long Term Goals - 12/12/14 1520    OT LONG TERM GOAL #1   Title Pt  will return to highest level of functioning and independence in daily tasks.    Status On-going   OT LONG TERM GOAL #2   Title Pt will decrease fascial restrictions from mod to min amounts or less.    Status On-going   OT LONG TERM GOAL #3   Title Pt will decrease pain to 2/10 or less during daily tasks.    Status On-going   OT LONG TERM GOAL #4   Title Pt will increase AROM to WNL to increase ability to reach into overhead cabinets.    Status On-going   OT LONG TERM GOAL #5   Title Pt will increase strength 4+/5 to increase ability to complete work tasks.    Status On-going               Plan - 12/26/14 1334    Clinical Impression Statement A: Pt comes in for therapy and reports she has been at the hospital for breathing treatments this am and was going to cancel however could not find the phone number. Pt reports she would like to try to complete session. Pt reports pain in neck, she believes due to coughing so much. Measurements taken for MD appt on Thursday, however range of motion limited due to increased pain. Pt not able to tolerate exercises this session, electing to leave early.    Plan P: Follow-up on MD appt. Resume missed exercises, add cybex row/press if able to tolerate.         Problem List Patient Active Problem List   Diagnosis Date Noted  . Chest pain 08/06/2012  . Diabetes mellitus 08/06/2012  . HTN (hypertension) 08/06/2012    Guadelupe Sabin, OTR/L  [phone]  12/26/2014, 1:43 PM  [place] [address] [place], [place], [address] Phone: [phone]   Fax:  [phone]

## 2014-12-28 ENCOUNTER — Ambulatory Visit (INDEPENDENT_AMBULATORY_CARE_PROVIDER_SITE_OTHER): Payer: Self-pay | Admitting: Orthopedic Surgery

## 2014-12-28 ENCOUNTER — Encounter (HOSPITAL_COMMUNITY): Payer: Self-pay

## 2014-12-28 ENCOUNTER — Ambulatory Visit (HOSPITAL_COMMUNITY): Payer: No Typology Code available for payment source | Attending: Orthopedic Surgery

## 2014-12-28 ENCOUNTER — Encounter: Payer: Self-pay | Admitting: Orthopedic Surgery

## 2014-12-28 VITALS — BP 128/72 | Ht 63.0 in | Wt 157.0 lb

## 2014-12-28 DIAGNOSIS — M25512 Pain in left shoulder: Secondary | ICD-10-CM | POA: Insufficient documentation

## 2014-12-28 DIAGNOSIS — R531 Weakness: Secondary | ICD-10-CM

## 2014-12-28 DIAGNOSIS — M629 Disorder of muscle, unspecified: Secondary | ICD-10-CM | POA: Insufficient documentation

## 2014-12-28 DIAGNOSIS — M25612 Stiffness of left shoulder, not elsewhere classified: Secondary | ICD-10-CM | POA: Insufficient documentation

## 2014-12-28 DIAGNOSIS — M6281 Muscle weakness (generalized): Secondary | ICD-10-CM | POA: Insufficient documentation

## 2014-12-28 DIAGNOSIS — M7502 Adhesive capsulitis of left shoulder: Secondary | ICD-10-CM

## 2014-12-28 NOTE — Progress Notes (Signed)
Patient ID: Yesenia Hoffman, female   DOB: [birth year], 49 y.o.   MRN: [id number] Chief Complaint  Patient presents with  . Follow-up    6 week recheck on left shoulder after PT at [facility].    49 female diabetic who presents for reevaluation of adhesive capsulitis status post her second round of physical therapy.  The patient is now examining approximate 40 of external rotation and 120 of forward elevation although she cannot internally rotate the hand past the back hip pocket  So we will continue therapy and follow-up again in 6 weeks  Interpreter present

## 2014-12-28 NOTE — Therapy (Signed)
Yes[place] Hoffman, [place], [address] Phone: [phone]   Fax:  [phone]  Occupational Therapy Treatment  Patient Details  Name: Yesenia Hoffman MRN: [id number] Date of Birth: [birth year] Referring Provider:  Carole Civil, MD  Encounter Date: 12/28/2014      OT End of Session - 12/28/14 1402    Visit Number 20   Number of Visits 22   Date for OT Re-Evaluation 02/10/15  mini reassessment 01/10/2015   Authorization Type None   OT Start Time 1300   OT Stop Time 1350   OT Time Calculation (min) 50 min   Activity Tolerance Patient tolerated treatment well   Behavior During Therapy [facility] for tasks assessed/performed      Past Medical History  Diagnosis Date  . Type II or unspecified type diabetes mellitus without mention of complication, uncontrolled   . Other and unspecified hyperlipidemia   . Unspecified essential hypertension   . Obesity, unspecified     No past surgical history on file.  There were no vitals filed for this visit.  Visit Diagnosis:  Decreased strength  Stiffness of joint, shoulder region, left      Subjective Assessment - 12/28/14 1315    Subjective  S: I feel a little better. No pain in my shoulder right now.    Currently in Pain? No/denies            [facility] OT Assessment - 12/28/14 1316    Assessment   Diagnosis adhesive capsuliltis of left shoulder   Precautions   Precautions None                  OT Treatments/Exercises (OP) - 12/28/14 1316    Exercises   Exercises Shoulder   Shoulder Exercises: Supine   Protraction PROM;10 reps;Strengthening;15 reps   Protraction Weight (lbs) 1   Horizontal ABduction PROM;10 reps;Strengthening;15 reps   Horizontal ABduction Weight (lbs) 1   External Rotation PROM;10 reps;Strengthening;15 reps   External Rotation Weight (lbs) 1   Internal Rotation PROM;10 reps;Strengthening;15 reps   Internal Rotation Weight (lbs) 1   Flexion PROM;10 reps;Strengthening;15 reps   Shoulder Flexion Weight (lbs) 1   ABduction PROM;10 reps;Strengthening;15 reps   Shoulder ABduction Weight (lbs) 1   Shoulder Exercises: Prone   Other Prone Exercises Hughston exercises; 12X; 1#   Other Prone Exercises scapular raises; 12X; 1#   Shoulder Exercises: Standing   Protraction Strengthening;12 reps   Protraction Weight (lbs) 1   Horizontal ABduction Strengthening;12 reps   Horizontal ABduction Weight (lbs) 1   External Rotation Strengthening;12 reps   External Rotation Weight (lbs) 1   Internal Rotation Strengthening;12 reps   Internal Rotation Weight (lbs) 1   Flexion Strengthening;12 reps   Shoulder Flexion Weight (lbs) 1   ABduction Strengthening;12 reps   Shoulder ABduction Weight (lbs) 1   Shoulder Exercises: ROM/Strengthening   UBE (Upper Arm Bike) Level 1 3' forward 3' reverse   Over Head Lace 2'   "W" Arms 10X   X to V Arms 12X with 1#   Proximal Shoulder Strengthening, Supine 15X with 1# no rest breaks   Proximal Shoulder Strengthening, Seated 12X with 1# two rest breaks   Ball on Wall 1' flexion 1' abduction green ball   Manual Therapy   Manual Therapy Muscle Energy Technique   Muscle Energy Technique Muscle energy technique to left anterior deltoid to relax tone and and muscle spasm and improve range of motion.  OT Short Term Goals - 12/12/14 1519    OT SHORT TERM GOAL #1   Title Pt will be educated on HEP.    OT SHORT TERM GOAL #2   Title Pt will decrease fascial restrictions from max to mod amounts.    OT SHORT TERM GOAL #3   Title Pt will decrease pain to 4/10 or less during daily tasks.    Status On-going   OT SHORT TERM GOAL #4   Title Pt will increase AROM to [facility] to increase ability to donn shirts.    OT SHORT TERM GOAL #5   Title Pt will increase strength to 3+/5 to increase ability fix hair.    Status Achieved           OT Long Term Goals - 12/12/14 1520    OT  LONG TERM GOAL #1   Title Pt will return to highest level of functioning and independence in daily tasks.    Status On-going   OT LONG TERM GOAL #2   Title Pt will decrease fascial restrictions from mod to min amounts or less.    Status On-going   OT LONG TERM GOAL #3   Title Pt will decrease pain to 2/10 or less during daily tasks.    Status On-going   OT LONG TERM GOAL #4   Title Pt will increase AROM to WNL to increase ability to reach into overhead cabinets.    Status On-going   OT LONG TERM GOAL #5   Title Pt will increase strength 4+/5 to increase ability to complete work tasks.    Status On-going               Plan - 12/28/14 1402    Clinical Impression Statement A: Increased repetitions with all supine and standing strengthening exercises. Added weight to prone exercises. Added Cybex row and press. Increased theraband to green. patient tolerated all exercises with fatigue noted. Pt states the Cybex Press was painful but it felt good.   Plan P: Cont with strengthening exercises and increasing PROM to increase functional reaching.         Problem List Patient Active Problem List   Diagnosis Date Noted  . Chest pain 08/06/2012  . Diabetes mellitus 08/06/2012  . HTN (hypertension) 08/06/2012    Ailene Ravel, OTR/L,CBIS  [phone]  12/28/2014, 2:04 PM  [place] [address] [place], [place], [address] Phone: [phone]   Fax:  [phone]

## 2014-12-28 NOTE — Patient Instructions (Signed)
Continue therapy

## 2015-01-02 ENCOUNTER — Ambulatory Visit (HOSPITAL_COMMUNITY): Payer: No Typology Code available for payment source | Admitting: Occupational Therapy

## 2015-01-02 ENCOUNTER — Encounter (HOSPITAL_COMMUNITY): Payer: Self-pay | Admitting: Occupational Therapy

## 2015-01-02 DIAGNOSIS — R531 Weakness: Secondary | ICD-10-CM

## 2015-01-02 DIAGNOSIS — M25512 Pain in left shoulder: Secondary | ICD-10-CM

## 2015-01-02 DIAGNOSIS — M629 Disorder of muscle, unspecified: Secondary | ICD-10-CM

## 2015-01-02 DIAGNOSIS — M6289 Other specified disorders of muscle: Secondary | ICD-10-CM

## 2015-01-02 DIAGNOSIS — M25612 Stiffness of left shoulder, not elsewhere classified: Secondary | ICD-10-CM

## 2015-01-02 NOTE — Therapy (Addendum)
[place], [place], [address] Phone: [phone]   Fax:  [phone]  Occupational Therapy Treatment  Patient Details  Name: Yesenia Hoffman MRN: [id number] Date of Birth: February 12, 1966 Referring Provider:  Carole Civil, MD  Encounter Date: 01/02/2015      OT End of Session - 01/02/15 1235    Visit Number 21   Number of Visits 22   Date for OT Re-Evaluation 02/10/15  mini reassessment 01/10/2015   Authorization Type None   OT Start Time 1149   OT Stop Time 1232   OT Time Calculation (min) 43 min   Activity Tolerance Patient tolerated treatment well   Behavior During Therapy [facility] for tasks assessed/performed      Past Medical History  Diagnosis Date  . Type II or unspecified type diabetes mellitus without mention of complication, uncontrolled   . Other and unspecified hyperlipidemia   . Unspecified essential hypertension   . Obesity, unspecified     No past surgical history on file.  There were no vitals filed for this visit.  Visit Diagnosis:  Decreased strength  Stiffness of joint, shoulder region, left  Tight fascia  Pain in left shoulder      Subjective Assessment - 01/02/15 1149    Subjective  S: I'm feeling good, those stomach exercises are hard with the weights.    Currently in Pain? No/denies                      OT Treatments/Exercises (OP) - 01/02/15 1200    Exercises   Exercises Shoulder   Shoulder Exercises: Supine   Protraction PROM;10 reps;Strengthening;15 reps   Protraction Weight (lbs) 1   Horizontal ABduction PROM;10 reps;Strengthening;15 reps   Horizontal ABduction Weight (lbs) 1   External Rotation PROM;10 reps;Strengthening;15 reps   External Rotation Weight (lbs) 1   Internal Rotation PROM;10 reps;Strengthening;15 reps   Internal Rotation Weight (lbs) 1   Flexion PROM;10 reps;Strengthening;15 reps   Shoulder Flexion Weight (lbs) 1   ABduction  PROM;10 reps;Strengthening;15 reps   Shoulder ABduction Weight (lbs) 1   Shoulder Exercises: Prone   Other Prone Exercises Hughston exercises; 12X; 1#   Other Prone Exercises scapular raises; 12X; 1#   Shoulder Exercises: Standing   Protraction Strengthening;12 reps   Protraction Weight (lbs) 1   Horizontal ABduction Strengthening;12 reps   Horizontal ABduction Weight (lbs) 1   External Rotation Strengthening;12 reps   External Rotation Weight (lbs) 1   Internal Rotation Strengthening;12 reps   Internal Rotation Weight (lbs) 1   Flexion Strengthening;12 reps   Shoulder Flexion Weight (lbs) 1   ABduction Strengthening;12 reps   Shoulder ABduction Weight (lbs) 1   Shoulder Exercises: ROM/Strengthening   UBE (Upper Arm Bike) Level 1 3' forward 3' reverse   "W" Arms 10X   X to V Arms 12X with 1#   Proximal Shoulder Strengthening, Supine 15X with 1# no rest breaks   Proximal Shoulder Strengthening, Seated 12X with 1# two rest breaks   Ball on Wall 1' flexion 1' abduction green ball   Manual Therapy   Manual Therapy Muscle Energy Technique   Muscle Energy Technique Muscle energy technique to left anterior deltoid to relax tone and and muscle spasm and improve range of motion.                  OT Short Term Goals - 12/12/14 1519    OT SHORT TERM GOAL #1  Title Pt will be educated on HEP.    OT SHORT TERM GOAL #2   Title Pt will decrease fascial restrictions from max to mod amounts.    OT SHORT TERM GOAL #3   Title Pt will decrease pain to 4/10 or less during daily tasks.    Status On-going   OT SHORT TERM GOAL #4   Title Pt will increase AROM to [facility] to increase ability to donn shirts.    OT SHORT TERM GOAL #5   Title Pt will increase strength to 3+/5 to increase ability fix hair.    Status Achieved           OT Long Term Goals - 12/12/14 1520    OT LONG TERM GOAL #1   Title Pt will return to highest level of functioning and independence in daily tasks.     Status On-going   OT LONG TERM GOAL #2   Title Pt will decrease fascial restrictions from mod to min amounts or less.    Status On-going   OT LONG TERM GOAL #3   Title Pt will decrease pain to 2/10 or less during daily tasks.    Status On-going   OT LONG TERM GOAL #4   Title Pt will increase AROM to WNL to increase ability to reach into overhead cabinets.    Status On-going   OT LONG TERM GOAL #5   Title Pt will increase strength 4+/5 to increase ability to complete work tasks.    Status On-going               Plan - 01/02/15 1235    Clinical Impression Statement A: Pt completed strengthening exercises this session, fatigue noted during prone exercises. Pt required verbal cuing for technique during prone exercises this session.    Plan P: Resume cybex row/press, add wrist weight to overhead lacing        Problem List Patient Active Problem List   Diagnosis Date Noted  . Chest pain 08/06/2012  . Diabetes mellitus 08/06/2012  . HTN (hypertension) 08/06/2012    Guadelupe Sabin, OTR/L  [phone]  01/02/2015, 12:49 PM  [place] [address] [place], [place], [address] Phone: [phone]   Fax:  [phone]

## 2015-01-03 ENCOUNTER — Encounter (HOSPITAL_COMMUNITY): Payer: No Typology Code available for payment source | Admitting: Specialist

## 2015-01-04 ENCOUNTER — Ambulatory Visit (HOSPITAL_COMMUNITY): Payer: No Typology Code available for payment source | Admitting: Occupational Therapy

## 2015-01-04 ENCOUNTER — Encounter (HOSPITAL_COMMUNITY): Payer: Self-pay | Admitting: Occupational Therapy

## 2015-01-04 DIAGNOSIS — M629 Disorder of muscle, unspecified: Secondary | ICD-10-CM

## 2015-01-04 DIAGNOSIS — R531 Weakness: Secondary | ICD-10-CM

## 2015-01-04 DIAGNOSIS — M25612 Stiffness of left shoulder, not elsewhere classified: Secondary | ICD-10-CM

## 2015-01-04 DIAGNOSIS — M6289 Other specified disorders of muscle: Secondary | ICD-10-CM

## 2015-01-04 DIAGNOSIS — M25512 Pain in left shoulder: Secondary | ICD-10-CM

## 2015-01-04 NOTE — Therapy (Signed)
[place], [place], [address] Phone: [phone]   Fax:  [phone]  Occupational Therapy Treatment  Patient Details  Name: Yesenia Hoffman MRN: [id number] Date of Birth: Jul 29, 1965 Referring Provider:  Carole Civil, MD  Encounter Date: 01/04/2015      OT End of Session - 01/04/15 1216    Visit Number 22   Number of Visits 24   Date for OT Re-Evaluation 02/10/15  mini reassessment 01/10/2015   Authorization Type None   OT Start Time 1135   OT Stop Time 1219   OT Time Calculation (min) 44 min   Activity Tolerance Patient tolerated treatment well   Behavior During Therapy [facility] for tasks assessed/performed      Past Medical History  Diagnosis Date  . Type II or unspecified type diabetes mellitus without mention of complication, uncontrolled   . Other and unspecified hyperlipidemia   . Unspecified essential hypertension   . Obesity, unspecified     No past surgical history on file.  There were no vitals filed for this visit.  Visit Diagnosis:  Decreased strength  Stiffness of joint, shoulder region, left  Tight fascia  Pain in left shoulder      Subjective Assessment - 01/04/15 1133    Currently in Pain? No/denies            [facility] OT Assessment - 01/04/15 1132    Assessment   Diagnosis adhesive capsuliltis of left shoulder   Precautions   Precautions None                  OT Treatments/Exercises (OP) - 01/04/15 1141    Exercises   Exercises Shoulder   Shoulder Exercises: Supine   Protraction PROM;5 reps;Strengthening;10 reps   Protraction Weight (lbs) 2   Horizontal ABduction PROM;5 reps;Strengthening;10 reps   Horizontal ABduction Weight (lbs) 2   External Rotation PROM;5 reps;Strengthening;10 reps   External Rotation Weight (lbs) 2   Internal Rotation PROM;5 reps;Strengthening;10 reps   Internal Rotation Weight (lbs) 2   Flexion PROM;5 reps;Strengthening;10  reps   Shoulder Flexion Weight (lbs) 2   ABduction PROM;5 reps;Strengthening;10 reps   Shoulder ABduction Weight (lbs) 2   Shoulder Exercises: Prone   Other Prone Exercises [place] exercises; 12X; 1#   Other Prone Exercises scapular raises; 12X; 1#   Shoulder Exercises: Standing   Protraction Strengthening;15 reps   Protraction Weight (lbs) 1   Horizontal ABduction Strengthening;15 reps   Horizontal ABduction Weight (lbs) 1   External Rotation Strengthening;15 reps   External Rotation Weight (lbs) 1   Internal Rotation Strengthening;15 reps   Internal Rotation Weight (lbs) 1   Flexion Strengthening;15 reps   Shoulder Flexion Weight (lbs) 1   ABduction Strengthening;15 reps   Shoulder ABduction Weight (lbs) 1   Shoulder Exercises: ROM/Strengthening   UBE (Upper Arm Bike) Level 1 3' forward 3' reverse   Cybex Press 2 plate;10 reps   Cybex Row 2 plate;10 reps   "W" Arms 10X   X to V Arms 12X with 1#   Proximal Shoulder Strengthening, Supine 10X with 2# no rest breaks   Proximal Shoulder Strengthening, Seated 12X with 1# two rest breaks   Ball on Wall 1' flexion 1' abduction green ball   Manual Therapy   Manual Therapy Muscle Energy Technique   Muscle Energy Technique Muscle energy technique to left anterior deltoid to relax tone and and muscle spasm and improve range of motion.  OT Short Term Goals - 12/12/14 1519    OT SHORT TERM GOAL #1   Title Pt will be educated on HEP.    OT SHORT TERM GOAL #2   Title Pt will decrease fascial restrictions from max to mod amounts.    OT SHORT TERM GOAL #3   Title Pt will decrease pain to 4/10 or less during daily tasks.    Status On-going   OT SHORT TERM GOAL #4   Title Pt will increase AROM to [facility] to increase ability to donn shirts.    OT SHORT TERM GOAL #5   Title Pt will increase strength to 3+/5 to increase ability fix hair.    Status Achieved           OT Long Term Goals - 12/12/14 1520    OT  LONG TERM GOAL #1   Title Pt will return to highest level of functioning and independence in daily tasks.    Status On-going   OT LONG TERM GOAL #2   Title Pt will decrease fascial restrictions from mod to min amounts or less.    Status On-going   OT LONG TERM GOAL #3   Title Pt will decrease pain to 2/10 or less during daily tasks.    Status On-going   OT LONG TERM GOAL #4   Title Pt will increase AROM to WNL to increase ability to reach into overhead cabinets.    Status On-going   OT LONG TERM GOAL #5   Title Pt will increase strength 4+/5 to increase ability to complete work tasks.    Status On-going               Plan - 01/04/15 1217    Clinical Impression Statement A: Added wrist weight to overhead lace, resumed cybex row/presss, increased supine weight to 2#, increased standing repetitions to 15. Pt tolerated treatment well, fatigue noted during prone exercises.    Plan P: Continue strengthening exercises, scapular stabilization exercises. Mini-reassessment        Problem List Patient Active Problem List   Diagnosis Date Noted  . Chest pain 08/06/2012  . Diabetes mellitus 08/06/2012  . HTN (hypertension) 08/06/2012    Guadelupe Sabin, OTR/L  [phone]  01/04/2015, 12:26 PM  [place], [place], [address] Phone: [phone]   Fax:  [phone]

## 2015-01-05 ENCOUNTER — Encounter (HOSPITAL_COMMUNITY): Payer: No Typology Code available for payment source

## 2015-01-09 ENCOUNTER — Encounter (HOSPITAL_COMMUNITY): Payer: Self-pay | Admitting: Occupational Therapy

## 2015-01-09 ENCOUNTER — Ambulatory Visit (HOSPITAL_COMMUNITY): Payer: No Typology Code available for payment source | Admitting: Occupational Therapy

## 2015-01-09 DIAGNOSIS — M6289 Other specified disorders of muscle: Secondary | ICD-10-CM

## 2015-01-09 DIAGNOSIS — R531 Weakness: Secondary | ICD-10-CM

## 2015-01-09 DIAGNOSIS — M25512 Pain in left shoulder: Secondary | ICD-10-CM

## 2015-01-09 DIAGNOSIS — M25612 Stiffness of left shoulder, not elsewhere classified: Secondary | ICD-10-CM

## 2015-01-09 DIAGNOSIS — M629 Disorder of muscle, unspecified: Secondary | ICD-10-CM

## 2015-01-09 NOTE — Therapy (Signed)
[place], [place], [address] Phone: [phone]   Fax:  [phone]  Occupational Therapy Reassessment and Treatment  Patient Details  Name: Yesenia Hoffman MRN: [id number] Date of Birth: Jun 09, 1965 Referring Provider:  Carole Civil, MD  Encounter Date: 01/09/2015      OT End of Session - 01/09/15 1645    Visit Number 23   Number of Visits 24   Date for OT Re-Evaluation 02/10/15   Authorization Type None   OT Start Time 1347   OT Stop Time 1435   OT Time Calculation (min) 48 min   Activity Tolerance Patient tolerated treatment well   Behavior During Therapy [facility] for tasks assessed/performed      Past Medical History  Diagnosis Date  . Type II or unspecified type diabetes mellitus without mention of complication, uncontrolled   . Other and unspecified hyperlipidemia   . Unspecified essential hypertension   . Obesity, unspecified     No past surgical history on file.  There were no vitals filed for this visit.  Visit Diagnosis:  Decreased strength  Stiffness of joint, shoulder region, left  Tight fascia  Pain in left shoulder      Subjective Assessment - 01/09/15 1346    Subjective  S: The only thing I can't do is fasten my bra.    Patient is accompained by: Interpreter   Special Tests FOTO Score: 57/100 (43% impairment)   Currently in Pain? Yes   Pain Score 4    Pain Location Shoulder   Pain Orientation Left   Pain Descriptors / Indicators Aching   Pain Type Chronic pain           [facility] OT Assessment - 01/09/15 1346    Assessment   Diagnosis adhesive capsuliltis of left shoulder   Precautions   Precautions None   AROM   Overall AROM Comments Assessed seated, ER/IR adducted   AROM Assessment Site Shoulder   Right/Left Shoulder Left   Left Shoulder Flexion 136 Degrees  previous 129   Left Shoulder ABduction 126 Degrees  previous 124   Left Shoulder Internal Rotation 90  Degrees  same as previous   Left Shoulder External Rotation 60 Degrees  previous 45   PROM   Overall PROM Comments Assessed in supine, ER/IR adducted   PROM Assessment Site Shoulder   Right/Left Shoulder Left   Left Shoulder Flexion 155 Degrees  previous 135   Left Shoulder ABduction 99 Degrees  previous 95   Left Shoulder Internal Rotation 90 Degrees  same as previous   Left Shoulder External Rotation 68 Degrees  previous 60   Strength   Left Shoulder Flexion 4+/5  previous 4/5   Left Shoulder ABduction 4/5  same as previous   Left Shoulder Internal Rotation 4/5  same as previous   Left Shoulder External Rotation 4/5  same as previous                  OT Treatments/Exercises (OP) - 01/09/15 1352    Exercises   Exercises Shoulder   Shoulder Exercises: Supine   Other Supine Exercises theraband exercises-horizontal abduction, PNF pattern movements, ER/IR. 10X each with red theraband.     Shoulder Exercises: Stretch   Internal Rotation Stretch 3 reps  10"   Manual Therapy   Manual Therapy Muscle Energy Technique;Myofascial release   Myofascial Release Myofascial release to left upper arm, trapezius, and scapularis region to decrease fascial restrictions and increase  jont mobility in a pain free zone.    Muscle Energy Technique Muscle energy technique to left anterior deltoid to relax tone and and muscle spasm and improve range of motion.               OT Education - 01/09/15 2197    Education provided Yes   Education Details red theraband exercises; internal rotation towel stretch   Person(s) Educated Patient   Methods Explanation;Demonstration;Handout   Comprehension Verbalized understanding;Returned demonstration          OT Short Term Goals - 01/09/15 1408    OT SHORT TERM GOAL #1   Title Pt will be educated on HEP.    OT SHORT TERM GOAL #2   Title Pt will decrease fascial restrictions from max to mod amounts.    OT SHORT TERM GOAL #3    Title Pt will decrease pain to 4/10 or less during daily tasks.    Status Achieved   OT SHORT TERM GOAL #4   Title Pt will increase AROM to [facility] to increase ability to donn shirts.    OT SHORT TERM GOAL #5   Title Pt will increase strength to 3+/5 to increase ability fix hair.    Status Achieved           OT Long Term Goals - 01/09/15 1408    OT LONG TERM GOAL #1   Title Pt will return to highest level of functioning and independence in daily tasks.    Status Partially Met   OT LONG TERM GOAL #2   Title Pt will decrease fascial restrictions from mod to min amounts or less.    Status Achieved   OT LONG TERM GOAL #3   Title Pt will decrease pain to 2/10 or less during daily tasks.    Status Not Met   OT LONG TERM GOAL #4   Title Pt will increase AROM to WNL to increase ability to reach into overhead cabinets.    Status Not Met   OT LONG TERM GOAL #5   Title Pt will increase strength 4+/5 to increase ability to complete work tasks.    Status Partially Met               Plan - 01/09/15 1646    Clinical Impression Statement A: Mini reassessment completed this session. Pt has met all STGs, 1/5 LTGs and has partially met 2/5 LTGs. Pt has plateaued in therapy in regards to range of motion of LUE. Discussed discharge with pt and pt is agreeable. Provided and educated pt on red theraband HEP to continue strengthening at home.    Plan P: Discharge pt.         Problem List Patient Active Problem List   Diagnosis Date Noted  . Chest pain 08/06/2012  . Diabetes mellitus 08/06/2012  . HTN (hypertension) 08/06/2012    Guadelupe Sabin, OTR/L  [phone]  01/09/2015, 4:49 PM  [place], [place], [address] Phone: [phone]   Fax:  [phone]   OCCUPATIONAL THERAPY DISCHARGE SUMMARY  Visits from Start of Care: 23  Current functional level related to goals / functional outcomes: See above for  goals/measurements. Pt reports she is now able to complete most B/IADL tasks as she normally would. She is now able to reach above her head for items, is able to lift lightweight items, and is able to complete household tasks with minimal pain.    Remaining deficits:  Pt continues to have difficulty reaching behind her back to fasten her bra, as well as lifting heavy objects. Pt does experience pain when performing certain tasks, depending on how she moves her arm.    Education / Equipment: Pt was provided with and educated on red theraband HEP, along with internal rotation towel stretch.  Plan: Patient agrees to discharge.  Patient goals were partially met. Patient is being discharged due to lack of progress.  ?????  Pt has come to a plateau in progress during therapy in regards to progressing AROM.

## 2015-01-09 NOTE — Patient Instructions (Signed)
Strengthening: Chest Pull - Resisted   Hold Theraband in front of body with hands about shoulder width a part. Pull band a part and back together slowly. Repeat 10-15____ times. Complete __1__ set(s) per session.. Repeat ___2_ session(s) per day.  http://orth.exer.us/926   Copyright  VHI. All rights reserved.   PNF Strengthening: Resisted   Standing with resistive band around each hand, bring right arm up and away, thumb back. Repeat _10-15___ times per set. Do __1__ sets per session. Do __2__ sessions per day.  http://orth.exer.us/918   Copyright  VHI. All rights reserved.   PNF Strengthening: Resisted   Standing with resistive band around each hand, bring right arm up and across body. Repeat _10-15___ times per set. Do __1__ sets per session. Do __2__ sessions per day.  http://orth.exer.us/920   Copyright  VHI. All rights reserved.    Resisted External Rotation: in Neutral - Bilateral   Sit or stand, tubing in both hands, elbows at sides, bent to 90, forearms forward. Pinch shoulder blades together and rotate forearms out. Keep elbows at sides. Repeat _10-15___ times per set. Do __1__ sets per session. Do __2__ sessions per day.  http://orth.exer.us/966   Copyright  VHI. All rights reserved.   PNF Strengthening: Resisted   Standing, hold resistive band above head. Bring right arm down and out from side. Repeat __10-15__ times per set. Do __1__ sets per session. Do __2__ sessions per day.  http://orth.exer.us/922   Copyright  VHI. All rights reserved.

## 2015-01-11 ENCOUNTER — Encounter (HOSPITAL_COMMUNITY): Payer: No Typology Code available for payment source

## 2015-01-16 ENCOUNTER — Encounter (HOSPITAL_COMMUNITY): Payer: No Typology Code available for payment source

## 2015-01-18 ENCOUNTER — Encounter (HOSPITAL_COMMUNITY): Payer: No Typology Code available for payment source | Admitting: Occupational Therapy

## 2015-01-23 ENCOUNTER — Encounter (HOSPITAL_COMMUNITY): Payer: No Typology Code available for payment source | Admitting: Occupational Therapy

## 2015-01-25 ENCOUNTER — Encounter (HOSPITAL_COMMUNITY): Payer: No Typology Code available for payment source

## 2015-02-04 ENCOUNTER — Other Ambulatory Visit: Payer: Self-pay | Admitting: Physician Assistant

## 2015-02-08 ENCOUNTER — Ambulatory Visit: Payer: Self-pay | Admitting: Physician Assistant

## 2015-02-08 ENCOUNTER — Ambulatory Visit (INDEPENDENT_AMBULATORY_CARE_PROVIDER_SITE_OTHER): Payer: Self-pay | Admitting: Orthopedic Surgery

## 2015-02-08 VITALS — BP 103/78 | Ht 63.0 in | Wt 157.0 lb

## 2015-02-08 DIAGNOSIS — M25612 Stiffness of left shoulder, not elsewhere classified: Secondary | ICD-10-CM

## 2015-02-08 NOTE — Progress Notes (Signed)
Patient ID: Yesenia Hoffman, female   DOB: 11-02-1965, 49 y.o.   MRN: [id number]  Follow up visit  Chief Complaint  Patient presents with  . Follow-up    6 week follow up Left shoulder s/p therapy    BP 103/78 mmHg  Ht 5\' 3"  (1.6 m)  Wt 157 lb (71.215 kg)  BMI 27.82 kg/m2  LMP 01/16/2014  Encounter Diagnosis  Name Primary?  . Shoulder stiffness, left Yes    presents with interpreter present. Patient says she was going to therapy with a stop is a day cannot do anything else for her  Today I got 40 external rotation 120 of elevation and she still has poor internal rotation to just at the hip pocket  She says the therapist said that they were done because she wasn't progressing  However, adhesive capsulitis is a condition that responds to physical therapy she was making progress she may have plateau but that does not mean that she should stop physical therapy. I have reordered the therapy and I will see her in 6 weeks.

## 2015-02-08 NOTE — Patient Instructions (Signed)
Therapy extended, call to schedule

## 2015-02-13 ENCOUNTER — Ambulatory Visit: Payer: Self-pay | Admitting: Physician Assistant

## 2015-02-13 ENCOUNTER — Encounter: Payer: Self-pay | Admitting: Physician Assistant

## 2015-02-13 VITALS — BP 110/68 | HR 59 | Temp 97.5°F | Ht 63.0 in | Wt 155.9 lb

## 2015-02-13 DIAGNOSIS — I10 Essential (primary) hypertension: Secondary | ICD-10-CM

## 2015-02-13 DIAGNOSIS — Z1239 Encounter for other screening for malignant neoplasm of breast: Secondary | ICD-10-CM

## 2015-02-13 DIAGNOSIS — E1165 Type 2 diabetes mellitus with hyperglycemia: Secondary | ICD-10-CM

## 2015-02-13 MED ORDER — LISINOPRIL-HYDROCHLOROTHIAZIDE 20-25 MG PO TABS
1.0000 | ORAL_TABLET | Freq: Every day | ORAL | Status: DC
Start: 2015-02-13 — End: 2016-03-01

## 2015-02-13 MED ORDER — ATENOLOL 100 MG PO TABS: 100.0000 mg | ORAL_TABLET | Freq: Every day | ORAL | Status: DC

## 2015-02-13 NOTE — Progress Notes (Signed)
BP 110/68 mmHg  Pulse 59  Temp(Src) 97.5 F (36.4 C)  Ht 5\' 3"  (1.6 m)  Wt 155 lb 14.4 oz (70.716 kg)  BMI 27.62 kg/m2  SpO2 97%  LMP 01/16/2014   Subjective:    Patient ID: Yesenia Hoffman, female    DOB: Mar 31, 1966, 49 y.o.   MRN: [id number]  HPI: Yesenia Hoffman is a 49 y.o. female presenting on 02/13/2015 for Diabetes   HPI  Chief Complaint  Patient presents with  . Diabetes    Pt brought in her bs log.  She has felt well lately  Relevant past medical, surgical, family and social history reviewed and updated as indicated. Interim medical history since our last visit reviewed. Allergies and medications reviewed and updated.   Current outpatient prescriptions:  .  amLODipine (NORVASC) 10 MG tablet, Take 10 mg by mouth daily., Disp: , Rfl:  .  aspirin 81 MG tablet, Take 1 tablet (81 mg total) by mouth daily., Disp: , Rfl:  .  fexofenadine (ALLEGRA) 180 MG tablet, Take 180 mg by mouth daily., Disp: , Rfl:  .  fish oil-omega-3 fatty acids 1000 MG capsule, Take 2 g by mouth 2 (two) times daily. , Disp: , Rfl:  .  glipiZIDE (GLUCOTROL) 10 MG tablet, Take 10 mg by mouth 2 (two) times daily before a meal., Disp: , Rfl:  .  insulin NPH-regular Human (NOVOLIN 70/30) (70-30) 100 UNIT/ML injection, Inject 28 Units into the skin daily with breakfast. 26 units with supper, Disp: , Rfl:  .  metFORMIN (GLUCOPHAGE) 1000 MG tablet, Take 1,000 mg by mouth 2 (two) times daily with a meal., Disp: , Rfl:  .  naproxen (NAPROSYN) 500 MG tablet, Take 1 tablet (500 mg total) by mouth every 12 (twelve) hours., Disp: 60 tablet, Rfl: 2 .  nitroGLYCERIN (NITROSTAT) 0.4 MG SL tablet, Place 1 tablet (0.4 mg total) under the tongue every 5 (five) minutes as needed for chest pain., Disp: 25 tablet, Rfl: 3 .  ranitidine (ZANTAC) 300 MG capsule, Take 300 mg by mouth every evening., Disp: , Rfl:  .  Sennosides (TGT NATURAL LAXATIVE PILLS) 25 MG TABS, Take 1 tablet by mouth daily., Disp: , Rfl:  .   simvastatin (ZOCOR) 20 MG tablet, Take 20 mg by mouth daily., Disp: , Rfl:  .  atenolol (TENORMIN) 100 MG tablet, Take 1 tablet (100 mg total) by mouth daily. Tome una tableta por boca al dia, Disp: 30 tablet, Rfl: 3 .  lisinopril-hydrochlorothiazide (PRINZIDE,ZESTORETIC) 20-25 MG tablet, Take 1 tablet by mouth daily. Tome una tableta al dia, Disp: 30 tablet, Rfl: 3   Review of Systems  Constitutional: Negative for fever, chills, diaphoresis, appetite change, fatigue and unexpected weight change.  HENT: Positive for dental problem. Negative for congestion, drooling, ear pain, facial swelling, hearing loss, mouth sores, sneezing, sore throat, trouble swallowing and voice change.   Eyes: Negative for pain, discharge, redness, itching and visual disturbance.  Respiratory: Negative for cough, choking, shortness of breath and wheezing.   Cardiovascular: Negative for chest pain, palpitations and leg swelling.  Gastrointestinal: Negative for vomiting, abdominal pain, diarrhea, constipation and blood in stool.  Endocrine: Positive for heat intolerance. Negative for cold intolerance and polydipsia.  Genitourinary: Negative for dysuria, hematuria and decreased urine volume.  Musculoskeletal: Negative for back pain, arthralgias and gait problem.  Skin: Negative for rash.  Allergic/Immunologic: Positive for environmental allergies.  Neurological: Negative for seizures, syncope, light-headedness and headaches.  Hematological: Negative for adenopathy.  Psychiatric/Behavioral:  Negative for suicidal ideas, dysphoric mood and agitation. The patient is not nervous/anxious.     Per HPI unless specifically indicated above     Objective:    BP 110/68 mmHg  Pulse 59  Temp(Src) 97.5 F (36.4 C)  Ht 5\' 3"  (1.6 m)  Wt 155 lb 14.4 oz (70.716 kg)  BMI 27.62 kg/m2  SpO2 97%  LMP 01/16/2014  Wt Readings from Last 3 Encounters:  02/13/15 155 lb 14.4 oz (70.716 kg)  02/08/15 157 lb (71.215 kg)  12/28/14 157  lb (71.215 kg)    Physical Exam  Constitutional: She is oriented to person, place, and time. She appears well-developed and well-nourished.  HENT:  Head: Normocephalic and atraumatic.  Neck: Neck supple.  Cardiovascular: Normal rate and regular rhythm.   Pulmonary/Chest: Effort normal and breath sounds normal.  Abdominal: Soft. Bowel sounds are normal. She exhibits no mass. There is no tenderness.  Musculoskeletal: She exhibits no edema.  Lymphadenopathy:    She has no cervical adenopathy.  Neurological: She is alert and oriented to person, place, and time.  Skin: Skin is warm and dry.  Psychiatric: She has a normal mood and affect. Her behavior is normal.  Vitals reviewed.     Assessment & Plan:   Encounter Diagnoses  Name Primary?  . Type 2 diabetes mellitus with hyperglycemia, unspecified long term insulin use status ([place]) Yes  . Screening for breast cancer   . Essential hypertension       -increase insulin to 30 with breakfast and 28 with supper -order screening mammo -F/u 6 wk to recheck dm

## 2015-02-15 ENCOUNTER — Ambulatory Visit (HOSPITAL_COMMUNITY): Payer: Self-pay | Attending: Orthopedic Surgery

## 2015-02-15 ENCOUNTER — Encounter (HOSPITAL_COMMUNITY): Payer: Self-pay

## 2015-02-15 DIAGNOSIS — M25512 Pain in left shoulder: Secondary | ICD-10-CM | POA: Insufficient documentation

## 2015-02-15 DIAGNOSIS — M629 Disorder of muscle, unspecified: Secondary | ICD-10-CM | POA: Insufficient documentation

## 2015-02-15 DIAGNOSIS — M6281 Muscle weakness (generalized): Secondary | ICD-10-CM | POA: Insufficient documentation

## 2015-02-15 DIAGNOSIS — M25612 Stiffness of left shoulder, not elsewhere classified: Secondary | ICD-10-CM | POA: Insufficient documentation

## 2015-02-15 DIAGNOSIS — R531 Weakness: Secondary | ICD-10-CM

## 2015-02-15 DIAGNOSIS — M6289 Other specified disorders of muscle: Secondary | ICD-10-CM

## 2015-02-15 NOTE — Therapy (Signed)
[employer] [place], [place], [address] Phone: [phone]   Fax:  [phone]  Occupational Therapy Evaluation  Patient Details  Name: Yesenia Hoffman MRN: [id number] Date of Birth: Feb 09, 1966 No Data Recorded  Encounter Date: 02/15/2015      OT End of Session - 02/15/15 0916    Visit Number 1   Number of Visits 5   Date for OT Re-Evaluation 04/16/15  Mini reassess: 03/15/15   Authorization Type None - Patient is Self Pay   OT Start Time 0800   OT Stop Time 0852   OT Time Calculation (min) 52 min   Activity Tolerance Patient tolerated treatment well   Behavior During Therapy [facility] for tasks assessed/performed      Past Medical History  Diagnosis Date  . Type II or unspecified type diabetes mellitus without mention of complication, uncontrolled   . Other and unspecified hyperlipidemia   . Unspecified essential hypertension   . Obesity, unspecified     No past surgical history on file.  There were no vitals filed for this visit.  Visit Diagnosis:  Stiffness of joint, shoulder region, left - Plan: Ot plan of care cert/re-cert  Decreased strength - Plan: Ot plan of care cert/re-cert  Tight fascia - Plan: Ot plan of care cert/re-cert      Subjective Assessment - 02/15/15 0901    Subjective  S: I have trouble at work when I have to lift boxes up and hold them out for a long period of time.    Patient is accompained by: Interpreter   Pertinent History Patient a 49 y/o female S/P left shoulder stiffness returning to [place] for limited joint mobility with external and internal rotation. Patient was recently discharged from therapy after completing 3 months for left shoulder adhesive capsulitis. Dr. Aline Brochure has referred patient to occupational therapy for evaluation and treatment.    Special Tests FOTO score: 64/100 (36% impaired)   Patient Stated Goals To be able to reach back behind my back better.    Currently in  Pain? No/denies           [facility] OT Assessment - 02/15/15 0807    Assessment   Diagnosis Left shoulder stiffness   Precautions   Precautions None   Restrictions   Weight Bearing Restrictions No   Balance Screen   Has the patient fallen in the past 6 months No   Home  Environment   Family/patient expects to be discharged to: Private residence   Prior Function   Level of Independence Independent   Vocation Full time employment   Vocation Requirements [employer], reaching for and lifting items.    Leisure exercising   ADL   ADL comments Patient reports that she is unable to reach her bra to hook/unhook. Patient also reports that at work she has to lift boxes and hold them up for long periods of time and her arm fatigues quickly. 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   Mobility   Mobility Status Independent   Written Expression   Dominant Hand Right   Vision - History   Baseline Vision Wears glasses all the time   Cognition   Overall Cognitive Status Within Functional Limits for tasks assessed   ROM / Strength   AROM / PROM / Strength AROM;PROM;Strength   Palpation   Palpation comment Min fascial restrictions in left upper deltoid region.  AROM   Overall AROM Comments Assessed seated. er/IR ABDUCTED   AROM Assessment Site Shoulder   Right/Left Shoulder Left   Left Shoulder Flexion 130 Degrees   Left Shoulder ABduction 120 Degrees   Left Shoulder Internal Rotation 66 Degrees   Left Shoulder External Rotation 46 Degrees   PROM   Overall PROM Comments Assessed seated. er/IR ABDUCTED   PROM Assessment Site Shoulder   Right/Left Shoulder Left   Left Shoulder Flexion 140 Degrees   Left Shoulder ABduction 136 Degrees   Left Shoulder Internal Rotation 80 Degrees   Left Shoulder External Rotation 75 Degrees   Strength   Overall Strength Comments Assessed standing. er/IR ABDUCTED   Strength  Assessment Site Shoulder   Right/Left Shoulder Left   Left Shoulder Flexion 4/5   Left Shoulder ABduction 4/5   Left Shoulder Internal Rotation 4+/5   Left Shoulder External Rotation 4+/5                  OT Treatments/Exercises (OP) - 02/15/15 1100    Exercises   Exercises Shoulder   Shoulder Exercises: Standing   External Rotation Theraband;10 reps   Theraband Level (Shoulder External Rotation) Level 2 (Red)  with towel   Internal Rotation Theraband;10 reps   Theraband Level (Shoulder Internal Rotation) Level 2 (Red)  with towel   Shoulder Exercises: Stretch   Internal Rotation Stretch 3 reps  10 seconds; towel   Other Shoulder Stretches Sleeper stretch; 3 times; 10 second hold               OT Education - 02/15/15 0915    Education provided Yes   Education Details Goals for therapy. External and Internal rotation stretches and strengthening exercises using red theraband.    Person(s) Educated Patient   Methods Explanation;Demonstration;Handout   Comprehension Verbalized understanding;Returned demonstration          OT Short Term Goals - 02/15/15 0928    OT SHORT TERM GOAL #1   Title Patient will independed and educated with HEP.    Time 4   Period Weeks   Status New   OT SHORT TERM GOAL #2   Title Patient will decrease fascial restrictions to trace amount to increase joint mobility during external and internal rotation.    Time 4   Period Weeks   Status New   OT SHORT TERM GOAL #3   Title Patient will increase shoulder endurance while reporting decreased difficulty completing work tasks requiring lifting and holding boxes.   Time 4   Period Weeks   Status New   OT SHORT TERM GOAL #4   Title Patient will increase External and Internal rotation measurements by 10 degrees to increase ability to reach bra to hook/unhook.   Time 4   Period Weeks   Status New   OT SHORT TERM GOAL #5   Title Patient will increase strength to 4+/5 overall in left  shoulder to increase ability to complete work tasks with less difficulty.    Time 4   Period Weeks   Status New              Plan - 02/15/15 2637    Clinical Impression Statement A: Patient is a 49 y/o female S/P left shoulder stiffness causing increased fascial restrictions and decreased strength, ROM, and endurance resulting in difficulty completing daily and work related tasks. Discussed outcome for therapy as well as patient being self pay for therapy. Recommended 1x a week for 4 weeks with  aggressive focus on external and internal rotation movements as well as shoulder endurance and strength. Patient agreed with recommnedation.    Pt will benefit from skilled therapeutic intervention in order to improve on the following deficits (Retired) Decreased strength;Decreased range of motion;Increased fascial restricitons   Rehab Potential Excellent   OT Frequency 1x / week   OT Duration 4 weeks   OT Treatment/Interventions Self-care/ADL training;Passive range of motion;Patient/family education;Cryotherapy;Electrical Stimulation;Moist Heat;Therapeutic activities;Therapeutic exercises;Manual Therapy   Plan P: Skilled OT services required to increase functional performance during daily and work related tasks using LUE. Treatment Plan: Focusing heavily on Internal and External rotation more so with shoulder abducted. Agressive stretching and strengthening. shoulder endurance (PVC piping, Arms on fire, overhead lacing), work hardening exercises.    Consulted and Agree with Plan of Care Patient        Problem List Patient Active Problem List   Diagnosis Date Noted  . Chest pain 08/06/2012  . Diabetes mellitus ([place]) 08/06/2012  . HTN (hypertension) 08/06/2012    Ailene Ravel, OTR/L,CBIS  [phone]  02/15/2015, 11:03 AM  [place] [address] [place], [place], [address] Phone: [phone]   Fax:  [phone]  Name: ONEIDA MCKAMEY MRN: [id number] Date of Birth: [birth year]

## 2015-02-15 NOTE — Patient Instructions (Signed)
Completa se extiende 2-3 veces al da. Mantenga la posicin durante 10 segundos y repetir 3 veces.   Inicio: agarrar Rozann Lesches o una correa y la posicin de su brazo de tratamiento detrs de la espalda, la toalla o la correa debe ser sobre el hombro opuesto como muestra la imagen Movimiento: pegando los hombros [place] atrs y Medical laboratory scientific officer a Pharmacist, community (con su brazo fuerte) con cuidado para que su brazo de tratamiento se detuvo en la rotacin interna de un tramo. Mantenga x 10 segundos y Location manager a la posicin [place] extremo de una toalla con su lado afectado, con la palma mirando hacia atrs. Coge la toalla con su lado sano y tirar de su mano afectada en su espalda hasta que sienta un estiramiento en la parte delantera del hombro. Si siente dolor, tire slo para el dolor, no tire a Psychologist, occupational. Sostener. Devolver el brazo afectado a su lado. Trate de Solicitor / brazo cerca de su cuerpo durante todo el movimiento    Acostada de lado en lado afectado sobre una superficie firme Hacer retroceder un poco (20-30 DG) por lo que el hombro se encuentra en el mismo plano que la escpula Coloque el brazo en 90 grados de abduccin del hombro + flexin del codo Mover el brazo hacia abajo en IR (mantener el hombro, el brazo, el codo en la superficie) Una vez que sienta un tirn en la parte posterior del hombro, Risk manager la mano no afectada a (suavemente) empuje hacia abajo ms all de este punto Mantenga estiramiento durante 10-30 segundos Lentamente levante el brazo estirado hacia atrs a la posicin neutral Progresin: una vez que se puede mover el antebrazo casi todo el camino a la lona, ??usar el brazo no afectado para dar una ligera resistencia (abajo) en el brazo afectado a medida que gira externamente (copia de seguridad) a la posicin neutra. Mantenga la resistencia de 5-10 ". A continuacin, mueva el brazo afectado de [employer]     Mientras sostiene una banda elstica a su lado con el codo  doblado, comience con su mano fuera de su [place], y [employer] tirar de la banda hacia su Paramedic. Mantenga el codo cerca de su lado todo el [place].    Complete 12-15 times.  Mientras sostiene una banda elstica a su lado con el codo doblado, comience con su mano cerca de su estmago y [place] tirar de la banda de distancia. Mantenga el codo a su lado todo Physiological scientist.  Complete 12-15 times.

## 2015-02-19 ENCOUNTER — Encounter (HOSPITAL_COMMUNITY): Payer: Self-pay | Admitting: Specialist

## 2015-02-21 ENCOUNTER — Encounter (HOSPITAL_COMMUNITY): Payer: Self-pay

## 2015-02-22 ENCOUNTER — Encounter (HOSPITAL_COMMUNITY): Payer: Self-pay

## 2015-02-22 ENCOUNTER — Ambulatory Visit (HOSPITAL_COMMUNITY): Payer: Self-pay

## 2015-02-22 DIAGNOSIS — M629 Disorder of muscle, unspecified: Secondary | ICD-10-CM

## 2015-02-22 DIAGNOSIS — M25612 Stiffness of left shoulder, not elsewhere classified: Secondary | ICD-10-CM

## 2015-02-22 DIAGNOSIS — M25512 Pain in left shoulder: Secondary | ICD-10-CM

## 2015-02-22 DIAGNOSIS — M6289 Other specified disorders of muscle: Secondary | ICD-10-CM

## 2015-02-22 DIAGNOSIS — R531 Weakness: Secondary | ICD-10-CM

## 2015-02-22 NOTE — Therapy (Addendum)
[place], [place], [address] Phone: [phone]   Fax:  [phone]  Occupational Therapy Treatment  Patient Details  Name: Yesenia Hoffman MRN: [id number] Date of Birth: [birth year] No Data Recorded  Encounter Date: 02/22/2015      OT End of Session - 02/22/15 1224    Visit Number 2   Number of Visits 5   Date for OT Re-Evaluation 04/16/15  Mini reassess: 03/15/15   Authorization Type None - Patient is Self Pay   OT Start Time 0930   OT Stop Time 1020   OT Time Calculation (min) 50 min   Activity Tolerance Patient tolerated treatment well   Behavior During Therapy [facility] for tasks assessed/performed      Past Medical History  Diagnosis Date  . Type II or unspecified type diabetes mellitus without mention of complication, uncontrolled   . Other and unspecified hyperlipidemia   . Unspecified essential hypertension   . Obesity, unspecified     No past surgical history on file.  There were no vitals filed for this visit.  Visit Diagnosis:  Stiffness of joint, shoulder region, left  Decreased strength  Tight fascia  Pain in left shoulder      Subjective Assessment - 02/22/15 1218    Subjective  S: I'm starting to have pain that starts at my thumb and goes all the way my arm to the shoulder. It hurts a lot at work when [employer] pushing down on the boxes.   Currently in Pain? Yes   Pain Score 4    Pain Location Arm  deltoid region   Pain Orientation Left   Pain Descriptors / Indicators Aching            [facility] OT Assessment - 02/22/15 1220    Assessment   Diagnosis Left shoulder stiffness   Precautions   Precautions None                  OT Treatments/Exercises (OP) - 02/22/15 1220    Exercises   Exercises Shoulder   Shoulder Exercises: Supine   External Rotation PROM;10 reps  shoulder abducted   Internal Rotation PROM;10 reps  shoulder abducted   Shoulder Exercises:  ROM/Strengthening   Other ROM/Strengthening Exercises PVC sliding activity with pipe at a 45 degree angle against wall. Patient slid Left hand/arm up pipe while opening chest and shoulder into horizontal abduction. 15X   Shoulder Exercises: Stretch   Other Shoulder Stretches Sleeper stretch; 3 times; 10 second hold   Manual Therapy   Manual Therapy Muscle Energy Technique;Myofascial release   Myofascial Release Myofascial release to left lateral aspect of bicep and tricep region to decrease fascial restrictions and increase jont mobility in a pain free zone.    Muscle Energy Technique Muscle energy technique to left anterior deltoid to relax tone and and muscle spasm and improve range of motion.                  OT Short Term Goals - 02/22/15 1230    OT SHORT TERM GOAL #1   Title Patient will independed and educated with HEP.    Time 4   Period Weeks   Status On-going   OT SHORT TERM GOAL #2   Title Patient will decrease fascial restrictions to trace amount to increase joint mobility during external and internal rotation.    Time 4   Period Weeks   Status On-going   OT SHORT TERM  GOAL #3   Title Patient will increase shoulder endurance while reporting decreased difficulty completing work tasks requiring lifting and holding boxes.   Time 4   Period Weeks   Status On-going   OT SHORT TERM GOAL #4   Title Patient will increase External and Internal rotation measurements by 10 degrees to increase ability to reach bra to hook/unhook.   Time 4   Period Weeks   Status On-going   OT SHORT TERM GOAL #5   Title Patient will increase strength to 4+/5 overall in left shoulder to increase ability to complete work tasks with less difficulty.    Time 4   Period Weeks   Status On-going              Plan - 02/22/15 1224    Clinical Impression Statement A: Initiated myofascial release to left lateral region of deltoid/RUE followed by aggressive manual stretching focusing on  external and internal rotation. Patient reports that she has been completing a new job at work and is not experiencing increased pain starting at her thumb and rising up her arm to her shoulder.    Plan P: Add shoulder endurance exercises (arms on fire and overhead lacing, work hardening exercises. Educate on joint protection for left thumb joint.         Problem List Patient Active Problem List   Diagnosis Date Noted  . Chest pain 08/06/2012  . Diabetes mellitus ([place]) 08/06/2012  . HTN (hypertension) 08/06/2012    Ailene Ravel, OTR/L,CBIS  [phone]  02/22/2015, 12:31 PM  [place] [address] [place], [place], [address] Phone: [phone]   Fax:  [phone]  Name: Yesenia Hoffman MRN: [id number] Date of Birth: Dec 06, 1965

## 2015-02-26 ENCOUNTER — Encounter (HOSPITAL_COMMUNITY): Payer: Self-pay

## 2015-02-28 ENCOUNTER — Encounter (HOSPITAL_COMMUNITY): Payer: Self-pay

## 2015-03-02 ENCOUNTER — Encounter (HOSPITAL_COMMUNITY): Payer: Self-pay

## 2015-03-02 ENCOUNTER — Ambulatory Visit (HOSPITAL_COMMUNITY): Payer: Self-pay | Attending: Orthopedic Surgery

## 2015-03-02 DIAGNOSIS — M25512 Pain in left shoulder: Secondary | ICD-10-CM | POA: Insufficient documentation

## 2015-03-02 DIAGNOSIS — M6281 Muscle weakness (generalized): Secondary | ICD-10-CM | POA: Insufficient documentation

## 2015-03-02 DIAGNOSIS — M629 Disorder of muscle, unspecified: Secondary | ICD-10-CM | POA: Insufficient documentation

## 2015-03-02 DIAGNOSIS — M25612 Stiffness of left shoulder, not elsewhere classified: Secondary | ICD-10-CM | POA: Insufficient documentation

## 2015-03-02 DIAGNOSIS — M6289 Other specified disorders of muscle: Secondary | ICD-10-CM

## 2015-03-02 DIAGNOSIS — R531 Weakness: Secondary | ICD-10-CM

## 2015-03-02 NOTE — Therapy (Signed)
[place], [place], [address] Phone: [phone]   Fax:  [phone]  Occupational Therapy Treatment  Patient Details  Name: Yesenia Hoffman MRN: [id number] Date of Birth: 05/05/1965 No Data Recorded  Encounter Date: 03/02/2015      OT End of Session - 03/02/15 1017    Visit Number 3   Number of Visits 5   Date for OT Re-Evaluation 04/16/15  Mini reassess: 03/15/15   Authorization Type None - Patient is Self Pay   OT Start Time 0930   OT Stop Time 1021   OT Time Calculation (min) 51 min   Activity Tolerance Patient tolerated treatment well   Behavior During Therapy [facility] for tasks assessed/performed      Past Medical History  Diagnosis Date  . Type II or unspecified type diabetes mellitus without mention of complication, uncontrolled   . Other and unspecified hyperlipidemia   . Unspecified essential hypertension   . Obesity, unspecified     No past surgical history on file.  There were no vitals filed for this visit.  Visit Diagnosis:  Stiffness of joint, shoulder region, left  Decreased strength  Tight fascia      Subjective Assessment - 03/02/15 0955    Subjective  S: The thumb is not hurting as bad.    Currently in Pain? No/denies            [facility] OT Assessment - 03/02/15 1017    Assessment   Diagnosis Left shoulder stiffness   Precautions   Precautions None                  OT Treatments/Exercises (OP) - 03/02/15 0956    Exercises   Exercises Shoulder   Shoulder Exercises: Supine   External Rotation PROM;10 reps  shoulder abducted   Internal Rotation PROM;10 reps  shoulder abducted   Shoulder Exercises: ROM/Strengthening   Other ROM/Strengthening Exercises PVC sliding activity with pipe at a 45 degree angle against wall. Patient slid Left hand/arm up pipe while opening chest and shoulder into horizontal abduction. 15X   Other ROM/Strengthening Exercises Arms on  Fire    Shoulder Exercises: Stretch   [place] Stretch 3 reps;10 seconds   Other Shoulder Stretches Sleeper stretch; 3 times; 10 second hold; internal and external rotation   Modalities   Modalities Electrical Stimulation;Moist Heat   Moist Heat Therapy   Number Minutes Moist Heat 10 Minutes   Moist Heat Location Shoulder   Electrical Stimulation   Electrical Stimulation Location left deltoid   Electrical Stimulation Action interferential   Electrical Stimulation Parameters 23 CV   Electrical Stimulation Goals Pain   Manual Therapy   Manual Therapy Muscle Energy Technique;Myofascial release   Myofascial Release Myofascial release to left lateral aspect of bicep and tricep region to decrease fascial restrictions and increase jont mobility in a pain free zone.    Muscle Energy Technique Muscle energy technique to left anterior deltoid to relax tone and and muscle spasm and improve range of motion.                  OT Short Term Goals - 02/22/15 1230    OT SHORT TERM GOAL #1   Title Patient will independed and educated with HEP.    Time 4   Period Weeks   Status On-going   OT SHORT TERM GOAL #2   Title Patient will decrease fascial restrictions to trace amount to increase joint mobility  during external and internal rotation.    Time 4   Period Weeks   Status On-going   OT SHORT TERM GOAL #3   Title Patient will increase shoulder endurance while reporting decreased difficulty completing work tasks requiring lifting and holding boxes.   Time 4   Period Weeks   Status On-going   OT SHORT TERM GOAL #4   Title Patient will increase External and Internal rotation measurements by 10 degrees to increase ability to reach bra to hook/unhook.   Time 4   Period Weeks   Status On-going   OT SHORT TERM GOAL #5   Title Patient will increase strength to 4+/5 overall in left shoulder to increase ability to complete work tasks with less difficulty.    Time 4   Period Weeks    Status On-going               Plan - 03/02/15 1018    Clinical Impression Statement A: Added shoulder endurance activity "arms on fire" with patient showing signs of fatigue. Internal rotation is not [facility] suring passive stretching. External rotation is improving with muscle energy technique.    Plan Add overhead lacing. Attempt to complete arms on fire with 20" for each movement. Provide handout on joint protection for left them joint ( in spanish).        Problem List Patient Active Problem List   Diagnosis Date Noted  . Chest pain 08/06/2012  . Diabetes mellitus ([place]) 08/06/2012  . HTN (hypertension) 08/06/2012    Ailene Ravel, OTR/L,CBIS  [phone]  03/02/2015, 10:23 AM  [place], [place], [address] Phone: [phone]   Fax:  [phone]  Name: Yesenia Hoffman MRN: [id number] Date of Birth: Oct 03, 1965

## 2015-03-05 ENCOUNTER — Encounter (HOSPITAL_COMMUNITY): Payer: Self-pay

## 2015-03-07 ENCOUNTER — Other Ambulatory Visit: Payer: Self-pay | Admitting: Physician Assistant

## 2015-03-07 ENCOUNTER — Encounter (HOSPITAL_COMMUNITY): Payer: Self-pay

## 2015-03-07 DIAGNOSIS — Z1231 Encounter for screening mammogram for malignant neoplasm of breast: Secondary | ICD-10-CM

## 2015-03-08 ENCOUNTER — Other Ambulatory Visit: Payer: Self-pay | Admitting: Physician Assistant

## 2015-03-08 DIAGNOSIS — Z1231 Encounter for screening mammogram for malignant neoplasm of breast: Secondary | ICD-10-CM

## 2015-03-09 ENCOUNTER — Encounter (HOSPITAL_COMMUNITY): Payer: Self-pay

## 2015-03-09 ENCOUNTER — Ambulatory Visit (HOSPITAL_COMMUNITY): Payer: Self-pay

## 2015-03-09 DIAGNOSIS — R531 Weakness: Secondary | ICD-10-CM

## 2015-03-09 DIAGNOSIS — M25612 Stiffness of left shoulder, not elsewhere classified: Secondary | ICD-10-CM

## 2015-03-09 DIAGNOSIS — M629 Disorder of muscle, unspecified: Secondary | ICD-10-CM

## 2015-03-09 DIAGNOSIS — M6289 Other specified disorders of muscle: Secondary | ICD-10-CM

## 2015-03-09 NOTE — Therapy (Signed)
[place], [place], [address] Phone: [phone]   Fax:  [phone]  Occupational Therapy Treatment  Patient Details  Name: Yesenia Hoffman MRN: [id number] Date of Birth: 09-20-1965 No Data Recorded  Encounter Date: 03/09/2015      OT End of Session - 03/09/15 1043    Visit Number 4   Number of Visits 5   Date for OT Re-Evaluation 04/16/15  Mini reassess: 03/15/15   Authorization Type None - Patient is Self Pay   OT Start Time 0930   OT Stop Time 1015   OT Time Calculation (min) 45 min   Activity Tolerance Patient tolerated treatment well   Behavior During Therapy [facility] for tasks assessed/performed      Past Medical History  Diagnosis Date  . Type II or unspecified type diabetes mellitus without mention of complication, uncontrolled   . Other and unspecified hyperlipidemia   . Unspecified essential hypertension   . Obesity, unspecified     No past surgical history on file.  There were no vitals filed for this visit.  Visit Diagnosis:  Stiffness of joint, shoulder region, left  Decreased strength  Tight fascia      Subjective Assessment - 03/09/15 1047    Subjective  S: I received a shot in my thumb for pain and it has helped a lot.   Patient is accompained by: Interpreter   Currently in Pain? No/denies            [facility] OT Assessment - 03/09/15 0949    Assessment   Diagnosis Left shoulder stiffness   Precautions   Precautions None                  OT Treatments/Exercises (OP) - 03/09/15 0949    Exercises   Exercises Shoulder   Shoulder Exercises: Supine   External Rotation PROM;10 reps  shoulder abducted   Internal Rotation PROM;10 reps  shoulder abducted   Shoulder Exercises: Sidelying   Other Sidelying Exercises sleeper stretch; 10 seconds hold; 3 times; external and internal rotation   Shoulder Exercises: Standing   Internal Rotation Theraband;10 reps   Theraband Level (Shoulder Internal Rotation) Level 1 (Yellow)  behind back   Shoulder Exercises: Therapy Ball   Right/Left 5 reps   Shoulder Exercises: ROM/Strengthening   Over Head Lace 2' with 1#   Other ROM/Strengthening Exercises Arms on Fire; 20" hold for 5 positions   Shoulder Exercises: Stretch   Internal Rotation Stretch 3 reps  10 seconds; with towel   External Rotation Stretch 3 reps;10 seconds  with therapist facilitating stretch   Manual Therapy   Manual Therapy Muscle Energy Technique;Myofascial release   Myofascial Release Myofascial release to left lateral aspect of bicep and tricep region to decrease fascial restrictions and increase jont mobility in a pain free zone.    Muscle Energy Technique Muscle energy technique to left anterior deltoid to relax tone and and muscle spasm and improve range of motion.                  OT Short Term Goals - 02/22/15 1230    OT SHORT TERM GOAL #1   Title Patient will independed and educated with HEP.    Time 4   Period Weeks   Status On-going   OT SHORT TERM GOAL #2   Title Patient will decrease fascial restrictions to trace amount to increase joint mobility during external and internal rotation.  Time 4   Period Weeks   Status On-going   OT SHORT TERM GOAL #3   Title Patient will increase shoulder endurance while reporting decreased difficulty completing work tasks requiring lifting and holding boxes.   Time 4   Period Weeks   Status On-going   OT SHORT TERM GOAL #4   Title Patient will increase External and Internal rotation measurements by 10 degrees to increase ability to reach bra to hook/unhook.   Time 4   Period Weeks   Status On-going   OT SHORT TERM GOAL #5   Title Patient will increase strength to 4+/5 overall in left shoulder to increase ability to complete work tasks with less difficulty.    Time 4   Period Weeks   Status On-going           OT Long Term Goals - 01/09/15 1408    OT LONG TERM  GOAL #1   Title Pt will return to highest level of functioning and independence in daily tasks.    Status Partially Met   OT LONG TERM GOAL #2   Title Pt will decrease fascial restrictions from mod to min amounts or less.    Status Achieved   OT LONG TERM GOAL #3   Title Pt will decrease pain to 2/10 or less during daily tasks.    Status Not Met   OT LONG TERM GOAL #4   Title Pt will increase AROM to WNL to increase ability to reach into overhead cabinets.    Status Not Met   OT LONG TERM GOAL #5   Title Pt will increase strength 4+/5 to increase ability to complete work tasks.    Status Partially Met               Plan - 03/09/15 1044    Clinical Impression Statement A: Increased time with arms on fire, added 1# weight to overhead lacing and added circles with large therapy ball. VC for form and technique during exercises. patient continues to show improvement with IR.   Plan P: Continue to focus on shoulder stability and increasing er and IR needed to complete daily tasks.        Problem List Patient Active Problem List   Diagnosis Date Noted  . Chest pain 08/06/2012  . Diabetes mellitus ([place]) 08/06/2012  . HTN (hypertension) 08/06/2012    Ailene Ravel, OTR/L,CBIS  [phone]  03/09/2015, 10:48 AM  [place] [address] [place], [place], [address] Phone: [phone]   Fax:  [phone]  Name: Yesenia Hoffman MRN: [id number] Date of Birth: 25-Aug-1965

## 2015-03-12 ENCOUNTER — Encounter (HOSPITAL_COMMUNITY): Payer: Self-pay

## 2015-03-14 ENCOUNTER — Encounter (HOSPITAL_COMMUNITY): Payer: Self-pay

## 2015-03-16 ENCOUNTER — Ambulatory Visit (HOSPITAL_COMMUNITY): Payer: Self-pay

## 2015-03-16 ENCOUNTER — Encounter (HOSPITAL_COMMUNITY): Payer: Self-pay

## 2015-03-16 DIAGNOSIS — M6289 Other specified disorders of muscle: Secondary | ICD-10-CM

## 2015-03-16 DIAGNOSIS — M25612 Stiffness of left shoulder, not elsewhere classified: Secondary | ICD-10-CM

## 2015-03-16 DIAGNOSIS — M25512 Pain in left shoulder: Secondary | ICD-10-CM

## 2015-03-16 DIAGNOSIS — M629 Disorder of muscle, unspecified: Secondary | ICD-10-CM

## 2015-03-16 DIAGNOSIS — R531 Weakness: Secondary | ICD-10-CM

## 2015-03-16 NOTE — Therapy (Addendum)
[place], [place], [address] Phone: [phone]   Fax:  [phone]  Occupational Therapy Treatment  Patient Details  Name: Yesenia Hoffman MRN: [id number] Date of Birth: 23-Oct-1965 No Data Recorded  Encounter Date: 03/16/2015      OT End of Session - 03/16/15 1122    Visit Number 5   Number of Visits 6   Date for OT Re-Evaluation 04/16/15  Mini reassess: 03/15/15   Authorization Type None - Patient is Self Pay   OT Start Time 0930   OT Stop Time 1015   OT Time Calculation (min) 45 min   Activity Tolerance Patient tolerated treatment well   Behavior During Therapy [facility] for tasks assessed/performed      Past Medical History  Diagnosis Date  . Type II or unspecified type diabetes mellitus without mention of complication, uncontrolled   . Other and unspecified hyperlipidemia   . Unspecified essential hypertension   . Obesity, unspecified     No past surgical history on file.  There were no vitals filed for this visit.  Visit Diagnosis:  Stiffness of joint, shoulder region, left  Decreased strength  Tight fascia  Pain in left shoulder      Subjective Assessment - 03/16/15 1124    Subjective  S: I'm able to get my bra on and off. i was able to do it twice now.   Currently in Pain? Yes   Pain Score 3    Pain Location Shoulder   Pain Orientation Left   Pain Descriptors / Indicators Aching   Pain Type Chronic pain                      OT Treatments/Exercises (OP) - 03/16/15 0943    Exercises   Exercises Shoulder   Shoulder Exercises: Supine   External Rotation PROM;10 reps  shoulder abducted   Internal Rotation PROM;10 reps  shoulder abducted   Shoulder Exercises: Standing   Horizontal ABduction Theraband;10 reps   Theraband Level (Shoulder Horizontal ABduction) Level 2 (Red)   Internal Rotation Theraband;10 reps   Theraband Level (Shoulder Internal Rotation) Level 1  (Yellow)  behind back   Extension AAROM;Theraband;10 reps  Using PVC pipe   Theraband Level (Shoulder Extension) Level 2 (Red)   Shoulder Exercises: Stretch   Internal Rotation Stretch 3 reps  10 second hold with short pvc pipe going up towards ceiling.   Internal Rotation Stretch Limitations Also completed going across lower back   External Rotation Stretch 3 reps;10 seconds  using corner of wall   Star Gazer Stretch 3 reps;10 seconds   Other Shoulder Stretches Sleeper stretch; 3 times; 10 second hold; internal and external rotation   Manual Therapy   Manual Therapy Muscle Energy Technique;Myofascial release   Myofascial Release Myofascial release to left lateral aspect of bicep and tricep region to decrease fascial restrictions and increase jont mobility in a pain free zone.    Muscle Energy Technique Muscle energy technique to left anterior deltoid to relax tone and and muscle spasm and improve range of motion.                  OT Short Term Goals - 02/22/15 1230    OT SHORT TERM GOAL #1   Title Patient will independed and educated with HEP.    Time 4   Period Weeks   Status On-going   OT SHORT TERM GOAL #2   Title Patient  will decrease fascial restrictions to trace amount to increase joint mobility during external and internal rotation.    Time 4   Period Weeks   Status On-going   OT SHORT TERM GOAL #3   Title Patient will increase shoulder endurance while reporting decreased difficulty completing work tasks requiring lifting and holding boxes.   Time 4   Period Weeks   Status On-going   OT SHORT TERM GOAL #4   Title Patient will increase External and Internal rotation measurements by 10 degrees to increase ability to reach bra to hook/unhook.   Time 4   Period Weeks   Status On-going   OT SHORT TERM GOAL #5   Title Patient will increase strength to 4+/5 overall in left shoulder to increase ability to complete work tasks with less difficulty.    Time 4    Period Weeks   Status On-going               Plan - 03/16/15 1122    Clinical Impression Statement A: Continued to complete shoulder stability during session. Added PVC piping to assist with IR. VC for form and technique.   Plan P: Reassess and determine patient is ready for discharge.         Problem List Patient Active Problem List   Diagnosis Date Noted  . Chest pain 08/06/2012  . Diabetes mellitus ([place]) 08/06/2012  . HTN (hypertension) 08/06/2012    Ailene Ravel, OTR/L,CBIS  [phone]  03/16/2015, 11:30 AM  [place] [address] [place], [place], [address] Phone: [phone]   Fax:  [phone]  Name: Yesenia Hoffman MRN: [id number] Date of Birth: 12/02/1965

## 2015-03-19 ENCOUNTER — Encounter (HOSPITAL_COMMUNITY): Payer: Self-pay

## 2015-03-19 ENCOUNTER — Ambulatory Visit (HOSPITAL_COMMUNITY): Payer: Self-pay

## 2015-03-19 DIAGNOSIS — R531 Weakness: Secondary | ICD-10-CM

## 2015-03-19 DIAGNOSIS — M6289 Other specified disorders of muscle: Secondary | ICD-10-CM

## 2015-03-19 DIAGNOSIS — M629 Disorder of muscle, unspecified: Secondary | ICD-10-CM

## 2015-03-19 DIAGNOSIS — M25612 Stiffness of left shoulder, not elsewhere classified: Secondary | ICD-10-CM

## 2015-03-19 NOTE — Therapy (Signed)
[place] [address] [place], [place], [address] Phone: [phone]   Fax:  [phone]  Occupational Therapy Treatment and reassessment  Patient Details  Name: Yesenia Hoffman MRN: [id number] Date of Birth: 1965/11/22 No Data Recorded  Encounter Date: 03/19/2015      OT End of Session - 03/19/15 1128    Visit Number 6   Number of Visits 6   Date for OT Re-Evaluation 04/16/15   Authorization Type None - Patient is Self Pay   OT Start Time 0935   OT Stop Time 1015   OT Time Calculation (min) 40 min   Activity Tolerance Patient tolerated treatment well   Behavior During Therapy [facility] for tasks assessed/performed      Past Medical History  Diagnosis Date  . Type II or unspecified type diabetes mellitus without mention of complication, uncontrolled   . Other and unspecified hyperlipidemia   . Unspecified essential hypertension   . Obesity, unspecified     No past surgical history on file.  There were no vitals filed for this visit.  Visit Diagnosis:  Stiffness of joint, shoulder region, left  Decreased strength  Tight fascia      Subjective Assessment - 03/19/15 1048    Subjective  S: I'm able to push heavy boxes up    Patient is accompained by: Interpreter   Special Tests FOTO score: 75/100 (25% impaired)            [facility] OT Assessment - 03/19/15 0935    Assessment   Diagnosis Left shoulder stiffness   Precautions   Precautions None   AROM   Overall AROM Comments Assessed seated. er/IR ABDUCTED   AROM Assessment Site Shoulder   Right/Left Shoulder Left   Left Shoulder Internal Rotation 66 Degrees  previous: 66   Left Shoulder External Rotation 70 Degrees  previous: 46   PROM   Overall PROM Comments Assessed seated. er/IR ABDUCTED   PROM Assessment Site Shoulder   Right/Left Shoulder Left   Left Shoulder Internal Rotation 80 Degrees  previous: 80   Left Shoulder External Rotation 83 Degrees   previous: 75   Strength   Overall Strength Comments Assessed standing. er/IR ABDUCTED   Strength Assessment Site Shoulder   Right/Left Shoulder Left   Left Shoulder Internal Rotation 4+/5  previous: 4+/5   Left Shoulder External Rotation 4+/5  previous: 4+/5                  OT Treatments/Exercises (OP) - 03/19/15 0001    Exercises   Exercises Shoulder   Shoulder Exercises: Supine   External Rotation PROM;10 reps  shoulder abducted   Internal Rotation PROM;10 reps  shoulder abducted   Shoulder Exercises: Sidelying   Other Sidelying Exercises sleeper stretch; 10 seconds hold; 3 times; external and internal rotation   Shoulder Exercises: Standing   Horizontal ABduction Theraband;10 reps   Theraband Level (Shoulder Horizontal ABduction) Level 2 (Red)   External Rotation Theraband;10 reps   Theraband Level (Shoulder External Rotation) Level 2 (Red)   Internal Rotation Theraband;10 reps   Theraband Level (Shoulder Internal Rotation) Level 1 (Yellow)   Extension AAROM;15 reps;Theraband;10 reps  AA/ROM using PVC pipe   Theraband Level (Shoulder Extension) Level 2 (Red)   Shoulder Exercises: Stretch   Internal Rotation Stretch 3 reps  10 second hold   Internal Rotation Stretch Limitations Also completed going across lower back   Manual Therapy   Manual Therapy Muscle Energy Technique;Myofascial release  Myofascial Release Myofascial release to left lateral aspect of bicep and tricep region to decrease fascial restrictions and increase jont mobility in a pain free zone.    Muscle Energy Technique Muscle energy technique to left anterior deltoid to relax tone and and muscle spasm and improve range of motion.                OT Education - 03/19/15 1128    Education provided Yes   Education Details Reviewed HEP and recommended shoulder stretches and strengthening exercises to continue at home.    Person(s) Educated Patient   Methods Explanation;Demonstration    Comprehension Returned demonstration;Verbalized understanding          OT Short Term Goals - 03/19/15 0956    OT SHORT TERM GOAL #1   Title Patient will independed and educated with HEP.    Time 4   Period Weeks   Status Achieved   OT SHORT TERM GOAL #2   Title Patient will decrease fascial restrictions to trace amount to increase joint mobility during external and internal rotation.    Time 4   Period Weeks   Status Achieved   OT SHORT TERM GOAL #3   Title Patient will increase shoulder endurance while reporting decreased difficulty completing work tasks requiring lifting and holding boxes.   Time 4   Period Weeks   Status Achieved   OT SHORT TERM GOAL #4   Title Patient will increase External and Internal rotation measurements by 10 degrees to increase ability to reach bra to hook/unhook.   Time 4   Period Weeks   Status Partially Met   OT SHORT TERM GOAL #5   Title Patient will increase strength to 4+/5 overall in left shoulder to increase ability to complete work tasks with less difficulty.    Time 4   Period Weeks   Status Achieved             Plan - 03/19/15 1129    Clinical Impression Statement A: Mini reassessment completed this date. Therapy as been focusing primarily on External rotation and internal rotation needed during daily and work related tasks. Pt reports that she is able to reach up overhead and push heavy boxes at work with less difficulty. Pt also reports that she is able to reach her bra to hook and unhook. Pt has met 4/5 therapy goals with 1 more partially met. Pt  is in agreement with discharge.    Plan P: D/C from therapy with HEP.        Problem List Patient Active Problem List   Diagnosis Date Noted  . Chest pain 08/06/2012  . Diabetes mellitus ([place]) 08/06/2012  . HTN (hypertension) 08/06/2012  OCCUPATIONAL THERAPY DISCHARGE SUMMARY  Visits from Start of Care: 6  Current functional level related to goals / functional outcomes: See  goals above   Remaining deficits: Pt does not have full passive or active ROM with external and internal rotation. Pt does have functional ROM and is able to complete all daily and work related tasks. Pt reports that certain tasks are still difficult but she is able to complete them now as before she was not.    Education / Equipment: Shoulder strengthening and stretches  Plan: Patient agrees to discharge.  Patient goals were met. Patient is being discharged due to meeting the stated rehab goals.  ?????       Ailene Ravel, OTR/L,CBIS   [phone]  03/19/2015, 11:42 AM  [place]  [place] [address] [place], [place], [address] Phone: [phone]   Fax:  [phone]  Name: Yesenia Hoffman MRN: [id number] Date of Birth: 04-13-1966

## 2015-03-21 ENCOUNTER — Other Ambulatory Visit: Payer: Self-pay | Admitting: Physician Assistant

## 2015-03-21 ENCOUNTER — Ambulatory Visit (HOSPITAL_COMMUNITY)
Admission: RE | Admit: 2015-03-21 | Discharge: 2015-03-21 | Disposition: A | Discharge status: Home / Self Care | Payer: Self-pay | Source: Ambulatory Visit | Attending: Physician Assistant | Admitting: Physician Assistant

## 2015-03-21 ENCOUNTER — Encounter (HOSPITAL_COMMUNITY): Payer: Self-pay

## 2015-03-21 ENCOUNTER — Ambulatory Visit (HOSPITAL_COMMUNITY): Payer: Self-pay

## 2015-03-21 DIAGNOSIS — Z1231 Encounter for screening mammogram for malignant neoplasm of breast: Secondary | ICD-10-CM

## 2015-03-23 ENCOUNTER — Encounter (HOSPITAL_COMMUNITY): Payer: Self-pay | Admitting: Occupational Therapy

## 2015-03-25 ENCOUNTER — Other Ambulatory Visit: Payer: Self-pay | Admitting: Physician Assistant

## 2015-03-26 ENCOUNTER — Encounter (HOSPITAL_COMMUNITY): Payer: Self-pay

## 2015-03-27 ENCOUNTER — Ambulatory Visit: Payer: Self-pay | Admitting: Orthopedic Surgery

## 2015-03-27 ENCOUNTER — Ambulatory Visit: Payer: Self-pay | Admitting: Physician Assistant

## 2015-03-28 ENCOUNTER — Encounter (HOSPITAL_COMMUNITY): Payer: Self-pay

## 2015-03-30 ENCOUNTER — Encounter (HOSPITAL_COMMUNITY): Payer: Self-pay

## 2015-04-02 ENCOUNTER — Encounter (HOSPITAL_COMMUNITY): Payer: Self-pay

## 2015-04-03 ENCOUNTER — Encounter: Payer: Self-pay | Admitting: Physician Assistant

## 2015-04-03 ENCOUNTER — Ambulatory Visit: Payer: Self-pay | Admitting: Physician Assistant

## 2015-04-03 VITALS — BP 120/70 | HR 57 | Temp 97.5°F | Ht 63.0 in | Wt 155.6 lb

## 2015-04-03 DIAGNOSIS — E1165 Type 2 diabetes mellitus with hyperglycemia: Principal | ICD-10-CM

## 2015-04-03 DIAGNOSIS — D259 Leiomyoma of uterus, unspecified: Secondary | ICD-10-CM | POA: Insufficient documentation

## 2015-04-03 DIAGNOSIS — K219 Gastro-esophageal reflux disease without esophagitis: Secondary | ICD-10-CM | POA: Insufficient documentation

## 2015-04-03 DIAGNOSIS — IMO0001 Reserved for inherently not codable concepts without codable children: Secondary | ICD-10-CM | POA: Insufficient documentation

## 2015-04-03 DIAGNOSIS — I2510 Atherosclerotic heart disease of native coronary artery without angina pectoris: Secondary | ICD-10-CM

## 2015-04-03 DIAGNOSIS — I10 Essential (primary) hypertension: Secondary | ICD-10-CM | POA: Insufficient documentation

## 2015-04-03 DIAGNOSIS — E785 Hyperlipidemia, unspecified: Secondary | ICD-10-CM | POA: Insufficient documentation

## 2015-04-03 NOTE — Progress Notes (Signed)
BP 120/70 mmHg  Pulse 57  Temp(Src) 97.5 F (36.4 C)  Ht 5\' 3"  (1.6 m)  Wt 155 lb 9.6 oz (70.58 kg)  BMI 27.57 kg/m2  SpO2 98%  LMP 03/05/2015   Subjective:    Patient ID: Yesenia Hoffman, female    DOB: June 29, 1965, 49 y.o.   MRN: [id number]  HPI: Yesenia Hoffman is a 49 y.o. female presenting on 04/03/2015 for Diabetes   HPI  Chief Complaint  Patient presents with  . Diabetes    pt brought BS log    -Last A1c was in aug was 9.2 -Pt says she is worries about her uterus. Says she has a tumor but says that was a long time ago when [facility]. -Reviewed last [place] was done in 2013.  Pt states she had menstrual cycle November 2016.    Relevant past medical, surgical, family and social history reviewed and updated as indicated. Interim medical history since our last visit reviewed. Allergies and medications reviewed and updated.  Current outpatient prescriptions:  .  amLODipine (NORVASC) 10 MG tablet, Take 1 tablet (10 mg total) by mouth daily. Tome una tableta por boca diaria, Disp: 30 tablet, Rfl: 4 .  aspirin 81 MG tablet, Take 1 tablet (81 mg total) by mouth daily., Disp: , Rfl:  .  atenolol (TENORMIN) 100 MG tablet, Take 1 tablet (100 mg total) by mouth daily. Tome una tableta por boca al dia, Disp: 30 tablet, Rfl: 3 .  fexofenadine (ALLEGRA) 180 MG tablet, Take 180 mg by mouth daily., Disp: , Rfl:  .  fish oil-omega-3 fatty acids 1000 MG capsule, Take 2 g by mouth 2 (two) times daily. , Disp: , Rfl:  .  glipiZIDE (GLUCOTROL) 10 MG tablet, Take 20 mg by mouth 2 (two) times daily before a meal. , Disp: , Rfl:  .  insulin NPH-regular Human (NOVOLIN 70/30) (70-30) 100 UNIT/ML injection, Inject 30 Units into the skin daily with breakfast. 28 units with supper, Disp: , Rfl:  .  lisinopril-hydrochlorothiazide (PRINZIDE,ZESTORETIC) 20-25 MG tablet, Take 1 tablet by mouth daily. Tome una tableta al dia, Disp: 30 tablet, Rfl: 3 .  metFORMIN (GLUCOPHAGE) 1000 MG tablet,  Take 1,000 mg by mouth 2 (two) times daily with a meal., Disp: , Rfl:  .  naproxen (NAPROSYN) 500 MG tablet, Take 1 tablet (500 mg total) by mouth every 12 (twelve) hours., Disp: 60 tablet, Rfl: 2 .  Sennosides (TGT NATURAL LAXATIVE PILLS) 25 MG TABS, Take 1 tablet by mouth daily., Disp: , Rfl:  .  simvastatin (ZOCOR) 20 MG tablet, Take 1 tablet (20 mg total) by mouth at bedtime. Tome una tableta por boca al dormir, Disp: 30 tablet, Rfl: 4 .  nitroGLYCERIN (NITROSTAT) 0.4 MG SL tablet, Place 1 tablet (0.4 mg total) under the tongue every 5 (five) minutes as needed for chest pain. (Patient not taking: Reported on 04/03/2015), Disp: 25 tablet, Rfl: 3 .  ranitidine (ZANTAC) 300 MG capsule, Take 300 mg by mouth every evening., Disp: , Rfl:    Review of Systems  Constitutional: Negative for fever, chills, diaphoresis, appetite change, fatigue and unexpected weight change.  HENT: Positive for dental problem. Negative for congestion, drooling, ear pain, facial swelling, hearing loss, mouth sores, sneezing, sore throat, trouble swallowing and voice change.   Eyes: Negative for pain, discharge, redness, itching and visual disturbance.  Respiratory: Negative for cough, choking, shortness of breath and wheezing.   Cardiovascular: Negative for chest pain,  palpitations and leg swelling.  Gastrointestinal: Positive for constipation. Negative for vomiting, abdominal pain, diarrhea and blood in stool.  Endocrine: Negative for cold intolerance, heat intolerance and polydipsia.  Genitourinary: Negative for dysuria, hematuria and decreased urine volume.  Musculoskeletal: Negative for back pain, arthralgias and gait problem.  Skin: Negative for rash.  Allergic/Immunologic: Positive for environmental allergies.  Neurological: Negative for seizures, syncope, light-headedness and headaches.  Hematological: Negative for adenopathy.  Psychiatric/Behavioral: Negative for suicidal ideas, dysphoric mood and agitation. The  patient is not nervous/anxious.     Per HPI unless specifically indicated above     Objective:    BP 120/70 mmHg  Pulse 57  Temp(Src) 97.5 F (36.4 C)  Ht 5\' 3"  (1.6 m)  Wt 155 lb 9.6 oz (70.58 kg)  BMI 27.57 kg/m2  SpO2 98%  LMP 03/05/2015  Wt Readings from Last 3 Encounters:  04/03/15 155 lb 9.6 oz (70.58 kg)  02/13/15 155 lb 14.4 oz (70.716 kg)  02/08/15 157 lb (71.215 kg)    Physical Exam  Constitutional: She is oriented to person, place, and time. She appears well-developed and well-nourished.  HENT:  Head: Normocephalic and atraumatic.  Neck: Neck supple.  Cardiovascular: Normal rate and regular rhythm.   Pulmonary/Chest: Effort normal and breath sounds normal.  Abdominal: Soft. Bowel sounds are normal. She exhibits no mass. There is no hepatosplenomegaly. There is no tenderness.  Musculoskeletal: She exhibits no edema.  Lymphadenopathy:    She has no cervical adenopathy.  Neurological: She is alert and oriented to person, place, and time.  Skin: Skin is warm and dry.  Psychiatric: She has a normal mood and affect. Her behavior is normal.  Vitals reviewed.       Assessment & Plan:   Encounter Diagnoses  Name Primary?  Marland Kitchen Uncontrolled diabetes mellitus type 2 without complications, unspecified long term insulin use status ([place]) Yes  . Uterine leiomyoma, unspecified location   . Hyperlipemia   . Essential hypertension, benign   . Gastroesophageal reflux disease, esophagitis presence not specified   . Coronary artery disease involving native coronary artery of native heart without angina pectoris     -Check a1c, microalbumin and lipids -Send for DM eye exam -Send for pelvic US (pt states [facility] discount still active) -Increase insulin to 32 and 30 units -F/u 1 mo with bs log

## 2015-04-05 ENCOUNTER — Other Ambulatory Visit: Payer: Self-pay | Admitting: Physician Assistant

## 2015-04-05 DIAGNOSIS — D259 Leiomyoma of uterus, unspecified: Secondary | ICD-10-CM

## 2015-04-12 ENCOUNTER — Other Ambulatory Visit (HOSPITAL_COMMUNITY): Payer: Self-pay

## 2015-04-12 ENCOUNTER — Ambulatory Visit (HOSPITAL_COMMUNITY): Payer: Self-pay

## 2015-04-12 ENCOUNTER — Ambulatory Visit: Payer: Self-pay | Admitting: Orthopedic Surgery

## 2015-04-12 ENCOUNTER — Ambulatory Visit (HOSPITAL_COMMUNITY)
Admission: RE | Admit: 2015-04-12 | Discharge: 2015-04-12 | Disposition: A | Discharge status: Home / Self Care | Payer: Self-pay | Source: Ambulatory Visit | Attending: Physician Assistant | Admitting: Physician Assistant

## 2015-04-12 ENCOUNTER — Ambulatory Visit (HOSPITAL_COMMUNITY): Admission: RE | Admit: 2015-04-12 | Payer: Self-pay | Source: Ambulatory Visit

## 2015-04-12 DIAGNOSIS — N83201 Unspecified ovarian cyst, right side: Secondary | ICD-10-CM | POA: Insufficient documentation

## 2015-04-12 DIAGNOSIS — D259 Leiomyoma of uterus, unspecified: Secondary | ICD-10-CM | POA: Insufficient documentation

## 2015-04-13 LAB — COMPLETE METABOLIC PANEL WITH GFR
ALT: 13 U/L (ref 6–29)
AST: 15 U/L (ref 10–35)
Albumin: 4.1 g/dL (ref 3.6–5.1)
Alkaline Phosphatase: 92 U/L (ref 33–130)
BUN: 11 mg/dL (ref 7–25)
CALCIUM: 9.1 mg/dL (ref 8.6–10.4)
CO2: 29 mmol/L (ref 20–31)
CREATININE: 0.45 mg/dL — AB (ref 0.50–1.05)
Chloride: 100 mmol/L (ref 98–110)
GFR, Est Non African American: >89 mL/min (ref >=60)
Glucose, Bld: 127 mg/dL — ABNORMAL HIGH (ref 65–99)
Potassium: 4.1 mmol/L (ref 3.5–5.3)
Sodium: 138 mmol/L (ref 135–146)
TOTAL PROTEIN: 7.1 g/dL (ref 6.1–8.1)
Total Bilirubin: 0.5 mg/dL (ref 0.2–1.2)

## 2015-04-13 LAB — LIPID PANEL
CHOL/HDL RATIO: 3.5 ratio (ref <=5.0)
Cholesterol: 98 mg/dL — ABNORMAL LOW (ref 125–200)
HDL: 28 mg/dL — AB (ref >=46)
LDL CALC: 33 mg/dL (ref <130)
Triglycerides: 186 mg/dL — ABNORMAL HIGH (ref <150)
VLDL: 37 mg/dL — AB (ref <30)

## 2015-04-13 LAB — MICROALBUMIN / CREATININE URINE RATIO
Creatinine, Urine: 57 mg/dL (ref 20–320)
Microalb Creat Ratio: 5 mcg/mg creat (ref <30)
Microalb, Ur: 0.3 mg/dL

## 2015-04-13 LAB — HEMOGLOBIN A1C
HEMOGLOBIN A1C: 8.7 % — AB (ref <5.7)
Mean Plasma Glucose: 203 mg/dL — ABNORMAL HIGH (ref <117)

## 2015-04-15 ENCOUNTER — Other Ambulatory Visit: Payer: Self-pay | Admitting: Orthopedic Surgery

## 2015-04-15 ENCOUNTER — Other Ambulatory Visit: Payer: Self-pay | Admitting: Physician Assistant

## 2015-04-18 ENCOUNTER — Other Ambulatory Visit: Payer: Self-pay | Admitting: *Deleted

## 2015-04-18 MED ORDER — NAPROXEN 500 MG PO TABS: 500.0000 mg | ORAL_TABLET | Freq: Two times a day (BID) | ORAL | Status: DC

## 2015-04-19 ENCOUNTER — Ambulatory Visit: Payer: Self-pay | Admitting: Orthopedic Surgery

## 2015-05-07 ENCOUNTER — Ambulatory Visit: Payer: Self-pay | Admitting: Physician Assistant

## 2015-05-10 ENCOUNTER — Ambulatory Visit (INDEPENDENT_AMBULATORY_CARE_PROVIDER_SITE_OTHER): Payer: Self-pay | Admitting: Orthopedic Surgery

## 2015-05-10 DIAGNOSIS — M7502 Adhesive capsulitis of left shoulder: Secondary | ICD-10-CM

## 2015-05-10 DIAGNOSIS — M65311 Trigger thumb, right thumb: Secondary | ICD-10-CM

## 2015-05-11 NOTE — Progress Notes (Addendum)
Follow-up visit chief complaint recheck left shoulder diagnosed with adhesive capsulitis undergoing physical therapy  Patient has no complaints of regarding to the shoulder but says her right thumb is triggering  She has pain over the A1 pulley catching locking for several weeks now. This is recurrent situation she got an injection last year. It did well. The pain is described as intense sometimes waxing and waning with no associated trauma related to a no weakness but popping and locking noted  Review of systems skin normal neurovascular complaints none    Past Medical History  Diagnosis Date  . Type II or unspecified type diabetes mellitus without mention of complication, uncontrolled   . Other and unspecified hyperlipidemia   . Unspecified essential hypertension   . Obesity, unspecified   . CAD (coronary artery disease) 07/2013    stent placed  . GERD (gastroesophageal reflux disease)   . Anemia   . Migraine     Respiratory rate is 18 pulse is 86 weight is 157 pounds The patient is awake alert and oriented 3 Mood and affect are normal She is well-groomed  Right hand there is tenderness over the A1 pulley with mild swelling palpable nodular tendon in the palm  flexion-extension range of motion normal with catching  No instability Flexion strength is normal  Epitrochlear lymph nodes are negative Sensation is normal Radial pulse is normal capillary refill is normal Skin normal without rash or redness  Diagnosis flexor tenosynovium by this A1 pulley triggering right thumb  Inject right thumb  Procedure injection right thumb for triggering Depo-Medrol 40 mg and lidocaine 1% 2 mL mixed together Timeout taken consent obtained site confirmed right thumb Alcohol to prep the skin at the chloride to freeze it Injection performed  There were no complications  Secondary Diagnoses adhesive capsulitis

## 2015-05-15 ENCOUNTER — Ambulatory Visit: Payer: Self-pay | Admitting: Physician Assistant

## 2015-05-15 ENCOUNTER — Encounter: Payer: Self-pay | Admitting: Physician Assistant

## 2015-05-15 VITALS — BP 124/76 | HR 53 | Temp 97.2°F | Ht 63.0 in | Wt 161.3 lb

## 2015-05-15 DIAGNOSIS — E118 Type 2 diabetes mellitus with unspecified complications: Principal | ICD-10-CM

## 2015-05-15 DIAGNOSIS — D259 Leiomyoma of uterus, unspecified: Secondary | ICD-10-CM

## 2015-05-15 DIAGNOSIS — E1165 Type 2 diabetes mellitus with hyperglycemia: Secondary | ICD-10-CM

## 2015-05-15 NOTE — Patient Instructions (Signed)
La diabetes mellitus y los alimentos (Diabetes Mellitus and Food) Es importante que controle su nivel de azcar en la sangre (glucosa). El nivel de glucosa en sangre depende en gran medida de lo que usted come. Comer alimentos saludables en las cantidades [place] a lo largo del Training and development officer, aproximadamente a la misma hora [employer], lo ayudar a Chief Technology Officer su nivel de Multimedia programmer. Tambin puede ayudarlo a retrasar o Patent attorney de la diabetes mellitus. Comer de [employer] saludable incluso puede ayudarlo a Chartered loss adjuster de presin arterial y a Science writer o Theatre manager un peso saludable.  Entre las recomendaciones generales para alimentarse y Audiological scientist los alimentos de forma saludable, se incluyen las siguientes:  Respetar las comidas principales y comer colaciones con regularidad. Evitar pasar largos perodos sin comer con el fin de perder peso.  Seguir una dieta que consista principalmente en alimentos de origen vegetal, como frutas, vegetales, frutos secos, legumbres y cereales integrales.  Utilizar mtodos de coccin a baja temperatura, como hornear, en lugar de mtodos de coccin a alta temperatura, como frer en abundante aceite. Trabaje con el nutricionista para aprender a Financial planner nutricional de las etiquetas de los alimentos. CMO PUEDEN AFECTARME LOS ALIMENTOS? Carbohidratos Los carbohidratos afectan el nivel de glucosa en sangre ms que cualquier otro tipo de alimento. El nutricionista lo ayudar a Teacher, adult education cuntos carbohidratos puede consumir en cada comida y ensearle a contarlos. El recuento de carbohidratos es importante para mantener la glucosa en sangre en un nivel saludable, en especial si utiliza insulina o toma determinados medicamentos para la diabetes mellitus. Alcohol El alcohol puede provocar disminuciones sbitas de la glucosa en sangre (hipoglucemia), en especial si utiliza insulina o toma determinados medicamentos para la diabetes mellitus. La  hipoglucemia es una afeccin que puede poner en peligro la vida. Los sntomas de la hipoglucemia (somnolencia, mareos y Data processing manager) son similares a los sntomas de haber consumido mucho alcohol.  Si el mdico lo autoriza a beber alcohol, hgalo con moderacin y siga estas pautas:  Las mujeres no deben beber ms de un trago por da, y los hombres no deben beber ms de dos tragos por Training and development officer. Un trago es igual a:  12 onzas (355 ml) de cerveza  5 onzas de vino (150 ml) de vino  1,5onzas (23m) de bebidas espirituosas  No beba con el estmago vaco.  Mantngase hidratado. Beba agua, gaseosas dietticas o t helado sin azcar.  Las gaseosas comunes, los jugos y otros refrescos podran contener muchos carbohidratos y se dCivil Service fast streamer QU ALIMENTOS NO SE RECOMIENDAN? Cuando haga las elecciones de alimentos, es importante que recuerde que todos los alimentos son distintos. Algunos tienen menos nutrientes que otros por porcin, aunque podran tener la misma cantidad de caloras o carbohidratos. Es difcil darle al cuerpo lo que necesita cuando consume alimentos con menos nutrientes. Estos son algunos ejemplos de alimentos que debera evitar ya que contienen muchas caloras y carbohidratos, pero pocos nutrientes:  GPhysicist, medicaltrans (la mayora de los alimentos procesados incluyen grasas trans en la etiqueta de Informacin nutricional).  Gaseosas comunes.  Jugos.  Caramelos.  Dulces, como tortas, pasteles, rosquillas y [place]  Comidas fritas. QU ALIMENTOS PUEDO COMER? Consuma alimentos ricos en nutrientes, que nutrirn el cuerpo y lo mantendrn saludable. Los alimentos que debe comer tambin dependern de varios factores, como:  Las caloras que necesita.  Los medicamentos que toma.  Su peso.  El nivel de glucosa en sMarist College  El nArrow Rockde presin arterial.  El nivel de colesterol.  Debe consumir una amplia variedad de alimentos, por ejemplo:  Protenas.  Cortes de carne  magros.  Protenas con bajo contenido de grasas saturadas, como pescado, clara de huevo y frijoles. Evite las carnes procesadas.  Frutas y vegetales.  Frutas y vegetales que pueden ayudar a controlar los niveles sanguneos de glucosa, como manzanas, mangos y batatas.  Productos lcteos.  Elija productos lcteos sin grasa o con bajo contenido de grasa, como leche, yogur y queso.  Cereales, panes, pastas y arroz.  Elija cereales integrales, como panes multicereales, avena en grano y arroz integral. Estos alimentos pueden ayudar a controlar la presin arterial.  Grasas.  Alimentos que contengan grasas saludables, como frutos secos, aguacate, aceite de oliva, aceite de canola y pescado. TODOS LOS QUE PADECEN DIABETES MELLITUS TIENEN EL MISMO PLAN DE COMIDAS? Dado que todas las personas que padecen diabetes mellitus son distintas, no hay un solo plan de comidas que funcione para todos. Es muy importante que se rena con un nutricionista que lo ayudar a crear un plan de comidas adecuado para usted.   Esta informacin no tiene como fin reemplazar el consejo del mdico. Asegrese de hacerle al mdico cualquier pregunta que tenga.   Document Released: 07/22/2007 Document Revised: 05/05/2014 Elsevier Interactive Patient Education 2016 Elsevier Inc.  

## 2015-05-15 NOTE — Progress Notes (Signed)
BP 124/76 mmHg  Pulse 53  Temp(Src) 97.2 F (36.2 C)  Ht 5\' 3"  (1.6 m)  Wt 161 lb 4.8 oz (73.165 kg)  BMI 28.58 kg/m2  SpO2 96%  LMP 03/05/2015   Subjective:    Patient ID: Yesenia Hoffman, female    DOB: 05/10/1965, 50 y.o.   MRN: [id number]  HPI: Yesenia Hoffman is a 50 y.o. female presenting on 05/15/2015 for Diabetes and Fibroids   HPI Pt increased her insulin to 32 and 30 units.  She has her bs log.  States bs went low one morning- about 2am it was 67.   Discussed with pt about her eating habits- avoid skipping meals and work on eating consistently.  Pt states her menses are irregular.  She says they are not heavy.  She states abd pain usually when she is having her menses.  States her last menses was 04/25/15.   Discussed [place] results with pt.   Relevant past medical, surgical, family and social history reviewed and updated as indicated. Interim medical history since our last visit reviewed. Allergies and medications reviewed and updated.   Current outpatient prescriptions:  .  amLODipine (NORVASC) 10 MG tablet, Take 1 tablet (10 mg total) by mouth daily. Tome una tableta por boca diaria, Disp: 30 tablet, Rfl: 4 .  aspirin 81 MG tablet, Take 1 tablet (81 mg total) by mouth daily., Disp: , Rfl:  .  atenolol (TENORMIN) 100 MG tablet, Take 1 tablet (100 mg total) by mouth daily. Tome una tableta por boca al dia, Disp: 30 tablet, Rfl: 3 .  fexofenadine (ALLEGRA) 180 MG tablet, Take 180 mg by mouth daily., Disp: , Rfl:  .  fish oil-omega-3 fatty acids 1000 MG capsule, Take 2 g by mouth 2 (two) times daily. , Disp: , Rfl:  .  glipiZIDE (GLUCOTROL) 10 MG tablet, Take 2 tablets (20 mg total) by mouth 2 (two) times daily before a meal. Tome dos tabletas por boca dos veces diarias con comida, Disp: 120 tablet, Rfl: 3 .  insulin NPH-regular Human (NOVOLIN 70/30) (70-30) 100 UNIT/ML injection, Inject 30 Units into the skin daily with breakfast. 30 units with supper, Disp: , Rfl:  .   lisinopril-hydrochlorothiazide (PRINZIDE,ZESTORETIC) 20-25 MG tablet, Take 1 tablet by mouth daily. Tome una tableta al dia, Disp: 30 tablet, Rfl: 3 .  metFORMIN (GLUCOPHAGE) 1000 MG tablet, Take 1 tablet (1,000 mg total) by mouth 2 (two) times daily. Tome una tableta por boca dos veces diarias con comida, Disp: 60 tablet, Rfl: 3 .  naproxen (NAPROSYN) 500 MG tablet, Take 1 tablet (500 mg total) by mouth every 12 (twelve) hours., Disp: 60 tablet, Rfl: 2 .  ranitidine (ZANTAC) 300 MG capsule, Take 300 mg by mouth every evening., Disp: , Rfl:  .  Sennosides (TGT NATURAL LAXATIVE PILLS) 25 MG TABS, Take 1 tablet by mouth daily., Disp: , Rfl:  .  simvastatin (ZOCOR) 20 MG tablet, Take 1 tablet (20 mg total) by mouth at bedtime. Tome una tableta por boca al dormir, Disp: 30 tablet, Rfl: 4 .  nitroGLYCERIN (NITROSTAT) 0.4 MG SL tablet, Place 1 tablet (0.4 mg total) under the tongue every 5 (five) minutes as needed for chest pain. (Patient not taking: Reported on 04/03/2015), Disp: 25 tablet, Rfl: 3   Review of Systems  Constitutional: Positive for fatigue and unexpected weight change. Negative for fever, chills, diaphoresis and appetite change.  HENT: Positive for dental problem and sneezing. Negative for congestion, drooling, ear pain,  facial swelling, hearing loss, mouth sores, sore throat, trouble swallowing and voice change.   Eyes: Negative for pain, discharge, redness, itching and visual disturbance.  Respiratory: Negative for cough, choking, shortness of breath and wheezing.   Cardiovascular: Positive for leg swelling. Negative for chest pain and palpitations.  Gastrointestinal: Positive for abdominal pain and constipation. Negative for vomiting, diarrhea and blood in stool.  Endocrine: Negative for cold intolerance, heat intolerance and polydipsia.  Genitourinary: Negative for dysuria, hematuria and decreased urine volume.  Musculoskeletal: Negative for back pain, arthralgias and gait problem.   Skin: Negative for rash.  Allergic/Immunologic: Negative for environmental allergies.  Neurological: Negative for seizures, syncope, light-headedness and headaches.  Hematological: Negative for adenopathy.  Psychiatric/Behavioral: Negative for suicidal ideas, dysphoric mood and agitation. The patient is nervous/anxious.     Per HPI unless specifically indicated above     Objective:    BP 124/76 mmHg  Pulse 53  Temp(Src) 97.2 F (36.2 C)  Ht 5\' 3"  (1.6 m)  Wt 161 lb 4.8 oz (73.165 kg)  BMI 28.58 kg/m2  SpO2 96%  LMP 03/05/2015  Wt Readings from Last 3 Encounters:  05/15/15 161 lb 4.8 oz (73.165 kg)  04/03/15 155 lb 9.6 oz (70.58 kg)  02/13/15 155 lb 14.4 oz (70.716 kg)    Physical Exam  Constitutional: She is oriented to person, place, and time. She appears well-developed and well-nourished.  HENT:  Head: Normocephalic and atraumatic.  Neck: Neck supple.  Cardiovascular: Normal rate and regular rhythm.   Pulmonary/Chest: Effort normal and breath sounds normal.  Abdominal: Soft. Bowel sounds are normal. She exhibits no mass. There is no tenderness.  Musculoskeletal: She exhibits edema (trace bilat).  Lymphadenopathy:    She has no cervical adenopathy.  Neurological: She is alert and oriented to person, place, and time.  Skin: Skin is warm and dry.  Psychiatric: She has a normal mood and affect. Her behavior is normal.  Vitals reviewed.   Results for orders placed or performed in visit on 04/03/15  Lipid Profile  Result Value Ref Range   Cholesterol 98 (L) 125 - 200 mg/dL   Triglycerides 186 (H) <150 mg/dL   HDL 28 (L) >=46 mg/dL   Total CHOL/HDL Ratio 3.5 <=5.0 Ratio   VLDL 37 (H) <30 mg/dL   LDL Cholesterol 33 <130 mg/dL  HgB A1c  Result Value Ref Range   Hgb A1c MFr Bld 8.7 (H) <5.7 %   Mean Plasma Glucose 203 (H) <117 mg/dL  Urine Microalbumin w/creat. ratio  Result Value Ref Range   Creatinine, Urine 57 20 - 320 mg/dL   Microalb, Ur 0.3 Not estab mg/dL    Microalb Creat Ratio 5 <30 mcg/mg creat  COMPLETE METABOLIC PANEL WITH GFR  Result Value Ref Range   Sodium 138 135 - 146 mmol/L   Potassium 4.1 3.5 - 5.3 mmol/L   Chloride 100 98 - 110 mmol/L   CO2 29 20 - 31 mmol/L   Glucose, Bld 127 (H) 65 - 99 mg/dL   BUN 11 7 - 25 mg/dL   Creat 0.45 (L) 0.50 - 1.05 mg/dL   Total Bilirubin 0.5 0.2 - 1.2 mg/dL   Alkaline Phosphatase 92 33 - 130 U/L   AST 15 10 - 35 U/L   ALT 13 6 - 29 U/L   Total Protein 7.1 6.1 - 8.1 g/dL   Albumin 4.1 3.6 - 5.1 g/dL   Calcium 9.1 8.6 - 10.4 mg/dL   GFR, Est African American >89 >=60  mL/min   GFR, Est Non African American >89 >=60 mL/min      Assessment & Plan:   Encounter Diagnoses  Name Primary?  Marland Kitchen Uncontrolled type 2 diabetes mellitus with complication, unspecified long term insulin use status ([place]) Yes  . Uterine leiomyoma, unspecified location     -reviewed labs with pt -a1c still high.  Again counseled pt on proper diabetic eating, especially that she needs to not skip meals -Pt to call when she starts her menses and we will schedule [place] to be done the week after that. -recommended to pt that she attend upcoming free Pap clinic being held at [facility] -pt on list for Dm eye exam -F/u 6 wk

## 2015-06-07 LAB — HM DIABETES EYE EXAM

## 2015-06-14 ENCOUNTER — Other Ambulatory Visit: Payer: Self-pay | Admitting: Physician Assistant

## 2015-07-03 ENCOUNTER — Encounter: Payer: Self-pay | Admitting: Physician Assistant

## 2015-07-03 ENCOUNTER — Ambulatory Visit: Payer: Self-pay | Admitting: Physician Assistant

## 2015-07-03 VITALS — BP 116/70 | HR 68 | Temp 97.3°F | Ht 63.0 in | Wt 163.0 lb

## 2015-07-03 DIAGNOSIS — I10 Essential (primary) hypertension: Secondary | ICD-10-CM

## 2015-07-03 DIAGNOSIS — R609 Edema, unspecified: Secondary | ICD-10-CM

## 2015-07-03 DIAGNOSIS — E785 Hyperlipidemia, unspecified: Secondary | ICD-10-CM

## 2015-07-03 DIAGNOSIS — E1165 Type 2 diabetes mellitus with hyperglycemia: Principal | ICD-10-CM

## 2015-07-03 DIAGNOSIS — R9389 Abnormal findings on diagnostic imaging of other specified body structures: Secondary | ICD-10-CM | POA: Insufficient documentation

## 2015-07-03 DIAGNOSIS — N841 Polyp of cervix uteri: Secondary | ICD-10-CM | POA: Insufficient documentation

## 2015-07-03 DIAGNOSIS — IMO0001 Reserved for inherently not codable concepts without codable children: Secondary | ICD-10-CM

## 2015-07-03 NOTE — Progress Notes (Signed)
BP 116/70 mmHg  Pulse 68  Temp(Src) 97.3 F (36.3 C)  Ht 5\' 3"  (1.6 m)  Wt 163 lb (73.936 kg)  BMI 28.88 kg/m2  SpO2 97%  LMP 03/05/2015   Subjective:    Patient ID: Yesenia Hoffman, female    DOB: 05-26-1965, 50 y.o.   MRN: [id number]  HPI: SHARIL LABORIN is a 50 y.o. female presenting on 07/03/2015 for Diabetes and Follow-up   HPI  Chief Complaint  Patient presents with  . Diabetes  . Follow-up    pt got pap done at hospital and was told they were going to refer her to get cervical polyp removed.     Pt went to pap clinic- I think it says she has 1 cm red cervical polyp that needs removal (messy writing)  Pt never got her f/u US done- she says she hasn't had a menstrual cycle since then.  Her LMP 04/25/15.  US done 04/12/15 recommended short-interval f/u US in 6-12 wk, preferably done week after menses.  Pt says she has stopped skipping meals.  She is not happy about gaining wt.  She says she is feeling better however.  Reviewed bs log.  Almost all in 100's, most less than 150.  Pt states LE swelling for past week. No cp or sob.   Legs are good in the morning but with walking/standing, it gets worse.  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  Pt due to f/u with cardiology in april  Relevant past medical, surgical, family and social history reviewed and updated as indicated. Interim medical history since our last visit reviewed. Allergies and medications reviewed and updated.  Current outpatient prescriptions:  .  amLODipine (NORVASC) 10 MG tablet, Take 1 tablet (10 mg total) by mouth daily. Tome una tableta por boca diaria, Disp: 30 tablet, Rfl: 4 .  aspirin 81 MG tablet, Take 1 tablet (81 mg total) by mouth daily., Disp: , Rfl:  .  atenolol (TENORMIN) 100 MG tablet, TAKE ONE TABLET BY MOUTH ONCE DAILY **TOMA UNA TABLETA POR BOCA AL DIA**, Disp: 30 tablet, Rfl: 3 .  fexofenadine (ALLEGRA) 180 MG tablet, Take 180 mg by mouth daily., Disp: , Rfl:  .   fish oil-omega-3 fatty acids 1000 MG capsule, Take 2 capsules by mouth 2 (two) times daily. , Disp: , Rfl:  .  glipiZIDE (GLUCOTROL) 10 MG tablet, Take 2 tablets (20 mg total) by mouth 2 (two) times daily before a meal. [employer] tabletas por boca dos veces diarias con comida, Disp: 120 tablet, Rfl: 3 .  insulin NPH-regular Human (NOVOLIN 70/30) (70-30) 100 UNIT/ML injection, Inject into the skin 2 (two) times daily with a meal. 32 in the a.m. And 30 units with supper, Disp: , Rfl:  .  lisinopril-hydrochlorothiazide (PRINZIDE,ZESTORETIC) 20-25 MG tablet, Take 1 tablet by mouth daily. Tome una tableta al dia, Disp: 30 tablet, Rfl: 3 .  metFORMIN (GLUCOPHAGE) 1000 MG tablet, Take 1 tablet (1,000 mg total) by mouth 2 (two) times daily. Tome una tableta por boca dos veces diarias con comida, Disp: 60 tablet, Rfl: 3 .  naproxen (NAPROSYN) 500 MG tablet, Take 1 tablet (500 mg total) by mouth every 12 (twelve) hours. (Patient taking differently: Take 500 mg by mouth every 12 (twelve) hours as needed. ), Disp: 60 tablet, Rfl: 2 .  nitroGLYCERIN (NITROSTAT) 0.4 MG SL tablet, Place 1 tablet (0.4 mg total) under the tongue every 5 (five) minutes as needed for  chest pain., Disp: 25 tablet, Rfl: 3 .  ranitidine (ZANTAC) 300 MG capsule, Take 300 mg by mouth every evening., Disp: , Rfl:  .  Sennosides (TGT NATURAL LAXATIVE PILLS) 25 MG TABS, Take 2 tablets by mouth 2 (two) times daily. , Disp: , Rfl:  .  simvastatin (ZOCOR) 20 MG tablet, Take 1 tablet (20 mg total) by mouth at bedtime. Tome una tableta por boca al dormir, Disp: 30 tablet, Rfl: 4  Review of Systems  Constitutional: Positive for fatigue. Negative for fever, chills, diaphoresis, appetite change and unexpected weight change.  HENT: Positive for congestion, dental problem, hearing loss and mouth sores. Negative for drooling, ear pain, facial swelling, sneezing, sore throat, trouble swallowing and voice change.   Eyes: Negative for pain, discharge, redness,  itching and visual disturbance.  Respiratory: Negative for cough, choking, shortness of breath and wheezing.   Cardiovascular: Positive for leg swelling. Negative for chest pain and palpitations.  Gastrointestinal: Positive for constipation. Negative for vomiting, abdominal pain, diarrhea and blood in stool.  Endocrine: Positive for heat intolerance. Negative for cold intolerance and polydipsia.  Genitourinary: Negative for dysuria, hematuria and decreased urine volume.  Musculoskeletal: Negative for back pain, arthralgias and gait problem.  Skin: Negative for rash.  Neurological: Negative for seizures, syncope, light-headedness and headaches.  Hematological: Negative for adenopathy.  Psychiatric/Behavioral: Positive for agitation. Negative for suicidal ideas and dysphoric mood. The patient is not nervous/anxious.     Per HPI unless specifically indicated above     Objective:    BP 116/70 mmHg  Pulse 68  Temp(Src) 97.3 F (36.3 C)  Ht 5\' 3"  (1.6 m)  Wt 163 lb (73.936 kg)  BMI 28.88 kg/m2  SpO2 97%  LMP 03/05/2015  Wt Readings from Last 3 Encounters:  07/03/15 163 lb (73.936 kg)  05/15/15 161 lb 4.8 oz (73.165 kg)  04/03/15 155 lb 9.6 oz (70.58 kg)    Physical Exam  Constitutional: She is oriented to person, place, and time. She appears well-developed and well-nourished.  HENT:  Head: Normocephalic and atraumatic.  Neck: Neck supple.  Cardiovascular: Normal rate and regular rhythm.   Pulmonary/Chest: Effort normal and breath sounds normal.  Abdominal: Soft. Bowel sounds are normal. She exhibits no mass. There is no hepatosplenomegaly. There is no tenderness.  Musculoskeletal: She exhibits edema (trace BLE edema).  Lymphadenopathy:    She has no cervical adenopathy.  Neurological: She is alert and oriented to person, place, and time.  Skin: Skin is warm and dry.  Psychiatric: She has a normal mood and affect. Her behavior is normal.  Vitals reviewed.       Assessment  & Plan:   Encounter Diagnoses  Name Primary?  Marland Kitchen Uncontrolled diabetes mellitus type 2 without complications, unspecified long term insulin use status ([place]) Yes  . Hyperlipidemia   . Essential hypertension   . Edema, unspecified type   . Abnormal ultrasound   . Cervical polyp     -Refer to gyn for remvoal of cervical polyp (seen on PAP 06/25/15).  Nurse will attempt to contact office where done to see if they already arranged appointment for pt since she seems uncertain about this -Repeat US -pt to get fasting Labs drawn tomorrow morning - will call with results -Due to her having so much going on, will f/u 1 month.  RTO sooner prn

## 2015-07-05 ENCOUNTER — Other Ambulatory Visit: Payer: Self-pay | Admitting: Physician Assistant

## 2015-07-05 DIAGNOSIS — R9389 Abnormal findings on diagnostic imaging of other specified body structures: Secondary | ICD-10-CM

## 2015-07-07 LAB — LIPID PANEL
CHOL/HDL RATIO: 3.8 ratio (ref <=5.0)
CHOLESTEROL: 111 mg/dL — AB (ref 125–200)
HDL: 29 mg/dL — ABNORMAL LOW (ref >=46)
LDL Cholesterol: 64 mg/dL (ref <130)
TRIGLYCERIDES: 92 mg/dL (ref <150)
VLDL: 18 mg/dL (ref <30)

## 2015-07-07 LAB — COMPLETE METABOLIC PANEL WITH GFR
ALBUMIN: 4.1 g/dL (ref 3.6–5.1)
ALK PHOS: 96 U/L (ref 33–130)
ALT: 12 U/L (ref 6–29)
AST: 15 U/L (ref 10–35)
BUN: 12 mg/dL (ref 7–25)
CALCIUM: 9.3 mg/dL (ref 8.6–10.4)
CO2: 28 mmol/L (ref 20–31)
CREATININE: 0.45 mg/dL — AB (ref 0.50–1.05)
Chloride: 99 mmol/L (ref 98–110)
GFR, Est Non African American: >89 mL/min (ref >=60)
Glucose, Bld: 207 mg/dL — ABNORMAL HIGH (ref 65–99)
POTASSIUM: 4.1 mmol/L (ref 3.5–5.3)
Sodium: 136 mmol/L (ref 135–146)
Total Bilirubin: 0.5 mg/dL (ref 0.2–1.2)
Total Protein: 7.2 g/dL (ref 6.1–8.1)

## 2015-07-07 LAB — HEMOGLOBIN A1C
Hgb A1c MFr Bld: 8.4 % — ABNORMAL HIGH (ref <5.7)
MEAN PLASMA GLUCOSE: 194 mg/dL — AB (ref <117)

## 2015-07-12 ENCOUNTER — Ambulatory Visit (HOSPITAL_COMMUNITY): Admission: RE | Admit: 2015-07-12 | Payer: Self-pay | Source: Ambulatory Visit

## 2015-07-17 ENCOUNTER — Encounter (HOSPITAL_COMMUNITY)
Admission: RE | Admit: 2015-07-17 | Discharge: 2015-07-17 | Disposition: A | Discharge status: Home / Self Care | Payer: Self-pay | Source: Ambulatory Visit | Attending: Physician Assistant | Admitting: Physician Assistant

## 2015-07-17 DIAGNOSIS — R9389 Abnormal findings on diagnostic imaging of other specified body structures: Secondary | ICD-10-CM

## 2015-07-17 DIAGNOSIS — R938 Abnormal findings on diagnostic imaging of other specified body structures: Secondary | ICD-10-CM | POA: Insufficient documentation

## 2015-07-20 ENCOUNTER — Other Ambulatory Visit: Payer: Self-pay | Admitting: Physician Assistant

## 2015-07-20 ENCOUNTER — Encounter (HOSPITAL_COMMUNITY): Payer: Self-pay | Admitting: *Deleted

## 2015-07-23 ENCOUNTER — Encounter: Payer: Self-pay | Admitting: Obstetrics & Gynecology

## 2015-07-26 ENCOUNTER — Encounter: Payer: Self-pay | Admitting: Obstetrics and Gynecology

## 2015-07-26 ENCOUNTER — Ambulatory Visit (INDEPENDENT_AMBULATORY_CARE_PROVIDER_SITE_OTHER): Payer: Self-pay | Admitting: Obstetrics and Gynecology

## 2015-07-26 ENCOUNTER — Other Ambulatory Visit: Payer: Self-pay | Admitting: Obstetrics and Gynecology

## 2015-07-26 VITALS — BP 122/80 | Ht 60.0 in | Wt 160.0 lb

## 2015-07-26 DIAGNOSIS — N841 Polyp of cervix uteri: Secondary | ICD-10-CM

## 2015-07-26 DIAGNOSIS — D259 Leiomyoma of uterus, unspecified: Secondary | ICD-10-CM

## 2015-07-26 DIAGNOSIS — R1900 Intra-abdominal and pelvic swelling, mass and lump, unspecified site: Secondary | ICD-10-CM

## 2015-07-26 NOTE — Progress Notes (Addendum)
Patient ID: ILCE HAVINS, female   DOB: Oct 29, 1965, 50 y.o.   MRN: [id number]   [facility] Visit  Patient name: Yesenia Hoffman MRN [id number]  Date of birth: 1966/04/17  CC & HPI:  GIHANNA UNGERMAN is a 50 y.o. female presenting today for f/u on an [place] ordered by [place] and completed by [facility] on 07/17/15 that showed fibroid, left ovarian mass.  Pt reports her last normal menstrual period was in 03/2015.  ROS:  10 Systems reviewed and all are negative for acute change except as noted in the HPI.  Pertinent History Reviewed:   Reviewed: Significant for cesarean section, CAD, DMTII Medical         Past Medical History  Diagnosis Date  . Type II or unspecified type diabetes mellitus without mention of complication, uncontrolled   . Other and unspecified hyperlipidemia   . Unspecified essential hypertension   . Obesity, unspecified   . CAD (coronary artery disease) 07/2013    stent placed  . GERD (gastroesophageal reflux disease)   . Anemia   . Migraine                               Surgical Hx:    Past Surgical History  Procedure Laterality Date  . Cesarean section  1982, 1985  . Cardiac catheterization     Medications: Reviewed & Updated - see associated section                       Current outpatient prescriptions:  .  amLODipine (NORVASC) 10 MG tablet, Take 1 tablet (10 mg total) by mouth daily. Tome una tableta por boca diaria, Disp: 30 tablet, Rfl: 4 .  aspirin 81 MG tablet, Take 1 tablet (81 mg total) by mouth daily., Disp: , Rfl:  .  atenolol (TENORMIN) 100 MG tablet, TAKE ONE TABLET BY MOUTH ONCE DAILY **TOMA UNA TABLETA POR BOCA AL DIA**, Disp: 30 tablet, Rfl: 3 .  fexofenadine (ALLEGRA) 180 MG tablet, Take 180 mg by mouth daily., Disp: , Rfl:  .  fish oil-omega-3 fatty acids 1000 MG capsule, Take 2 capsules by mouth 2 (two) times daily. , Disp: , Rfl:  .  glipiZIDE (GLUCOTROL) 10 MG tablet, Take 2 tablets (20 mg total) by  mouth 2 (two) times daily before a meal. [employer] tabletas por boca dos veces diarias con comida, Disp: 120 tablet, Rfl: 3 .  insulin NPH-regular Human (NOVOLIN 70/30) (70-30) 100 UNIT/ML injection, Inject into the skin 2 (two) times daily with a meal. 32 in the a.m. And 30 units with supper, Disp: , Rfl:  .  lisinopril-hydrochlorothiazide (PRINZIDE,ZESTORETIC) 20-25 MG tablet, Take 1 tablet by mouth daily. Tome una tableta al dia, Disp: 30 tablet, Rfl: 3 .  metFORMIN (GLUCOPHAGE) 1000 MG tablet, Take 1 tablet (1,000 mg total) by mouth 2 (two) times daily. Tome una tableta por boca dos veces diarias con comida, Disp: 60 tablet, Rfl: 3 .  naproxen (NAPROSYN) 500 MG tablet, Take 1 tablet (500 mg total) by mouth every 12 (twelve) hours. (Patient taking differently: Take 500 mg by mouth every 12 (twelve) hours as needed. ), Disp: 60 tablet, Rfl: 2 .  ranitidine (ZANTAC) 300 MG tablet, Take 1 tablet (300 mg total) by mouth daily. Tome una tableta por boca diaria, Disp: 30 tablet, Rfl: 4 .  Sennosides (TGT NATURAL LAXATIVE PILLS) 25 MG TABS,  Take 2 tablets by mouth 2 (two) times daily. , Disp: , Rfl:  .  simvastatin (ZOCOR) 20 MG tablet, Take 1 tablet (20 mg total) by mouth at bedtime. Tome una tableta por boca al dormir, Disp: 30 tablet, Rfl: 4 .  nitroGLYCERIN (NITROSTAT) 0.4 MG SL tablet, Place 1 tablet (0.4 mg total) under the tongue every 5 (five) minutes as needed for chest pain. (Patient not taking: Reported on 07/26/2015), Disp: 25 tablet, Rfl: 3   Social History: Reviewed -  reports that she has quit smoking. She has never used smokeless tobacco.  Objective Findings:  Vitals: Blood pressure 122/80, height 5' (1.524 m), weight 160 lb (72.576 kg), last menstrual period 03/05/2015.  Physical Examination: General appearance - alert, well appearing, and in no distress and oriented to person, place, and time Mental status - alert, oriented to person, place, and time, normal mood, behavior, speech,  dress, motor activity, and thought processes Pelvic - normal external genitalia, vulva, vagina, cervix, uterus and adnexa,  VULVA: normal appearing vulva with no masses, tenderness or lesions, VAGINA: normal appearing vagina with normal color and discharge, no lesions, CERVIX: normal appearing cervix without discharge or lesions,tiny endocervical polyp which is biopsied off and sent as specimen UTERUS: uterus is normal size, shape, consistency and nontender,  ADNEXA: normal adnexa in size, nontender and no masses,  Assessment & Plan:   A:  1. Tiny endocervical polyp which is biopsied off 2. Asymptomatic fibroid enlargement of uterus, no Tx required.   P:  1. Biopsy specimen to be forwarded to lab F/u by phone     By signing my name below, I, Terressa Koyanagi, attest that this documentation has been prepared under the direction and in the presence of Mallory Shirk, MD. Electronically Signed: Terressa Koyanagi, ED Scribe. 07/26/2015. 11:51 AM.   I personally performed the services described in this documentation, which was SCRIBED in my presence. The recorded information has been reviewed and considered accurate. It has been edited as necessary during review. Jonnie Kind, MD

## 2015-08-07 ENCOUNTER — Ambulatory Visit: Payer: Self-pay | Admitting: Physician Assistant

## 2015-08-07 ENCOUNTER — Other Ambulatory Visit: Payer: Self-pay | Admitting: Physician Assistant

## 2015-08-08 ENCOUNTER — Other Ambulatory Visit: Payer: Self-pay | Admitting: Physician Assistant

## 2015-08-14 ENCOUNTER — Ambulatory Visit: Payer: Self-pay | Admitting: Physician Assistant

## 2015-08-15 ENCOUNTER — Encounter: Payer: Self-pay | Admitting: Physician Assistant

## 2015-08-20 ENCOUNTER — Other Ambulatory Visit: Payer: Self-pay | Admitting: Physician Assistant

## 2015-08-21 ENCOUNTER — Encounter: Payer: Self-pay | Admitting: Physician Assistant

## 2015-08-21 ENCOUNTER — Ambulatory Visit: Payer: Self-pay | Admitting: Physician Assistant

## 2015-08-21 VITALS — BP 138/76 | HR 68 | Temp 97.5°F | Ht 63.0 in | Wt 161.2 lb

## 2015-08-21 DIAGNOSIS — I10 Essential (primary) hypertension: Secondary | ICD-10-CM

## 2015-08-21 DIAGNOSIS — I2510 Atherosclerotic heart disease of native coronary artery without angina pectoris: Secondary | ICD-10-CM

## 2015-08-21 DIAGNOSIS — E118 Type 2 diabetes mellitus with unspecified complications: Principal | ICD-10-CM

## 2015-08-21 DIAGNOSIS — IMO0002 Reserved for concepts with insufficient information to code with codable children: Secondary | ICD-10-CM | POA: Insufficient documentation

## 2015-08-21 DIAGNOSIS — E785 Hyperlipidemia, unspecified: Secondary | ICD-10-CM

## 2015-08-21 DIAGNOSIS — E1165 Type 2 diabetes mellitus with hyperglycemia: Secondary | ICD-10-CM | POA: Insufficient documentation

## 2015-08-21 DIAGNOSIS — K219 Gastro-esophageal reflux disease without esophagitis: Secondary | ICD-10-CM

## 2015-08-21 MED ORDER — METFORMIN HCL 1000 MG PO TABS: 1000.0000 mg | ORAL_TABLET | Freq: Two times a day (BID) | ORAL | Status: DC

## 2015-08-21 NOTE — Progress Notes (Signed)
BP 138/76 mmHg  Pulse 68  Temp(Src) 97.5 F (36.4 C)  Ht 5\' 3"  (1.6 m)  Wt 161 lb 3.2 oz (73.12 kg)  BMI 28.56 kg/m2  SpO2 97%  LMP 03/05/2015   Subjective:    Patient ID: Yesenia Hoffman, female    DOB: 1965-08-27, 50 y.o.   MRN: [id number]  HPI: Yesenia Hoffman is a 51 y.o. female presenting on 08/21/2015 for Diabetes   HPI   Pt states no low bs.  States it's always around 120-130.  She knows what to watch for signs hypoglycemia.   Reviewed pathology with pt that cervix polyp was benign  Relevant past medical, surgical, family and social history reviewed and updated as indicated. Interim medical history since our last visit reviewed. Allergies and medications reviewed and updated.   Current outpatient prescriptions:  .  amLODipine (NORVASC) 10 MG tablet, Take 1 tablet (10 mg total) by mouth daily. Tome una tableta por boca diaria, Disp: 30 tablet, Rfl: 4 .  aspirin 81 MG tablet, Take 1 tablet (81 mg total) by mouth daily., Disp: , Rfl:  .  atenolol (TENORMIN) 100 MG tablet, TAKE ONE TABLET BY MOUTH ONCE DAILY **TOMA UNA TABLETA POR BOCA AL DIA**, Disp: 30 tablet, Rfl: 3 .  fexofenadine (ALLEGRA) 180 MG tablet, Take 180 mg by mouth daily., Disp: , Rfl:  .  fish oil-omega-3 fatty acids 1000 MG capsule, Take 2 capsules by mouth 2 (two) times daily. , Disp: , Rfl:  .  glipiZIDE (GLUCOTROL) 10 MG tablet, Take 2 tablets (20 mg total) by mouth 2 (two) times daily before a meal. [employer] tabletas por boca dos veces diarias con comida, Disp: 120 tablet, Rfl: 3 .  insulin NPH-regular Human (NOVOLIN 70/30 RELION) (70-30) 100 UNIT/ML injection, Inject 32 units subcutaneously before breakfast and inject 30 units subcutaneously before supper.  Inyecte 32 unidades subcutaneos antes de el desayuno y inyecte 30 unidades subcutaneos antes de la cena, Disp: 1 vial, Rfl: 4 .  lisinopril-hydrochlorothiazide (PRINZIDE,ZESTORETIC) 20-25 MG tablet, Take 1 tablet by mouth daily. Tome una tableta al  dia, Disp: 30 tablet, Rfl: 3 .  metFORMIN (GLUCOPHAGE) 1000 MG tablet, Take 1 tablet (1,000 mg total) by mouth 2 (two) times daily. Tome una tableta por boca dos veces diarias con comida, Disp: 60 tablet, Rfl: 3 .  naproxen (NAPROSYN) 500 MG tablet, Take 1 tablet (500 mg total) by mouth every 12 (twelve) hours. (Patient taking differently: Take 500 mg by mouth every 12 (twelve) hours as needed. ), Disp: 60 tablet, Rfl: 2 .  ranitidine (ZANTAC) 300 MG tablet, Take 1 tablet (300 mg total) by mouth daily. Tome una tableta por boca diaria, Disp: 30 tablet, Rfl: 4 .  Sennosides (TGT NATURAL LAXATIVE PILLS) 25 MG TABS, Take 2 tablets by mouth 2 (two) times daily. , Disp: , Rfl:  .  simvastatin (ZOCOR) 20 MG tablet, Take 1 tablet (20 mg total) by mouth at bedtime. Tome una tableta por boca al dormir, Disp: 30 tablet, Rfl: 4 .  nitroGLYCERIN (NITROSTAT) 0.4 MG SL tablet, Place 1 tablet (0.4 mg total) under the tongue every 5 (five) minutes as needed for chest pain. (Patient not taking: Reported on 07/26/2015), Disp: 25 tablet, Rfl: 3   Review of Systems  Constitutional: Positive for fatigue. Negative for fever, chills, diaphoresis, appetite change and unexpected weight change.  HENT: Positive for dental problem. Negative for congestion, drooling, ear pain, facial swelling, hearing loss, mouth sores, sneezing, sore throat, trouble swallowing  and voice change.   Eyes: Negative for pain, discharge, redness, itching and visual disturbance.  Respiratory: Negative for cough, choking, shortness of breath and wheezing.   Cardiovascular: Positive for leg swelling. Negative for chest pain and palpitations.  Gastrointestinal: Positive for constipation. Negative for vomiting, abdominal pain, diarrhea and blood in stool.  Endocrine: Positive for heat intolerance. Negative for cold intolerance and polydipsia.  Genitourinary: Negative for dysuria, hematuria and decreased urine volume.  Musculoskeletal: Positive for back  pain. Negative for arthralgias and gait problem.  Skin: Negative for rash.  Allergic/Immunologic: Positive for environmental allergies.  Neurological: Negative for seizures, syncope, light-headedness and headaches.  Hematological: Negative for adenopathy.  Psychiatric/Behavioral: Negative for suicidal ideas, dysphoric mood and agitation. The patient is not nervous/anxious.     Per HPI unless specifically indicated above     Objective:    BP 138/76 mmHg  Pulse 68  Temp(Src) 97.5 F (36.4 C)  Ht 5\' 3"  (1.6 m)  Wt 161 lb 3.2 oz (73.12 kg)  BMI 28.56 kg/m2  SpO2 97%  LMP 03/05/2015  Wt Readings from Last 3 Encounters:  08/21/15 161 lb 3.2 oz (73.12 kg)  07/26/15 160 lb (72.576 kg)  07/03/15 163 lb (73.936 kg)    Physical Exam  Constitutional: She is oriented to person, place, and time. She appears well-developed and well-nourished.  HENT:  Head: Normocephalic and atraumatic.  Neck: Neck supple.  Cardiovascular: Normal rate and regular rhythm.   Pulmonary/Chest: Effort normal and breath sounds normal.  Abdominal: Soft. Bowel sounds are normal. She exhibits no mass. There is no hepatosplenomegaly. There is no tenderness.  Musculoskeletal: She exhibits no edema.  Lymphadenopathy:    She has no cervical adenopathy.  Neurological: She is alert and oriented to person, place, and time.  Skin: Skin is warm and dry.  Psychiatric: She has a normal mood and affect. Her behavior is normal.  Vitals reviewed.   Results for orders placed or performed in visit on 07/03/15  HgB A1c  Result Value Ref Range   Hgb A1c MFr Bld 8.4 (H) <5.7 %   Mean Plasma Glucose 194 (H) <117 mg/dL  Lipid Profile  Result Value Ref Range   Cholesterol 111 (L) 125 - 200 mg/dL   Triglycerides 92 <150 mg/dL   HDL 29 (L) >=46 mg/dL   Total CHOL/HDL Ratio 3.8 <=5.0 Ratio   VLDL 18 <30 mg/dL   LDL Cholesterol 64 <130 mg/dL  COMPLETE METABOLIC PANEL WITH GFR  Result Value Ref Range   Sodium 136 135 - 146  mmol/L   Potassium 4.1 3.5 - 5.3 mmol/L   Chloride 99 98 - 110 mmol/L   CO2 28 20 - 31 mmol/L   Glucose, Bld 207 (H) 65 - 99 mg/dL   BUN 12 7 - 25 mg/dL   Creat 0.45 (L) 0.50 - 1.05 mg/dL   Total Bilirubin 0.5 0.2 - 1.2 mg/dL   Alkaline Phosphatase 96 33 - 130 U/L   AST 15 10 - 35 U/L   ALT 12 6 - 29 U/L   Total Protein 7.2 6.1 - 8.1 g/dL   Albumin 4.1 3.6 - 5.1 g/dL   Calcium 9.3 8.6 - 10.4 mg/dL   GFR, Est African American >89 >=60 mL/min   GFR, Est Non African American >89 >=60 mL/min      Assessment & Plan:   Encounter Diagnoses  Name Primary?  Marland Kitchen Uncontrolled type 2 diabetes mellitus with complication, unspecified long term insulin use status ([place]) Yes  .  Coronary artery disease involving native coronary artery of native heart without angina pectoris   . Essential hypertension   . Hyperlipidemia   . Gastroesophageal reflux disease, esophagitis presence not specified     -reviewed labs with pt.  Will not adjust any meds today but counseled pt to watch diabetic diet and blood sugars -continue current medications -iFOBT given for colon cancer screening -f/u 3 months.  RTO sooner prn

## 2015-08-22 ENCOUNTER — Ambulatory Visit: Payer: Self-pay | Admitting: Physician Assistant

## 2015-08-23 ENCOUNTER — Encounter: Payer: Self-pay | Admitting: Physician Assistant

## 2015-08-28 ENCOUNTER — Other Ambulatory Visit: Payer: Self-pay | Admitting: Physician Assistant

## 2015-08-28 DIAGNOSIS — Z1211 Encounter for screening for malignant neoplasm of colon: Secondary | ICD-10-CM

## 2015-10-10 ENCOUNTER — Encounter: Payer: Self-pay | Admitting: Physician Assistant

## 2015-10-21 ENCOUNTER — Other Ambulatory Visit: Payer: Self-pay | Admitting: Physician Assistant

## 2015-11-13 ENCOUNTER — Other Ambulatory Visit: Payer: Self-pay | Admitting: Student

## 2015-11-13 DIAGNOSIS — E785 Hyperlipidemia, unspecified: Secondary | ICD-10-CM

## 2015-11-13 DIAGNOSIS — I10 Essential (primary) hypertension: Secondary | ICD-10-CM

## 2015-11-13 DIAGNOSIS — E118 Type 2 diabetes mellitus with unspecified complications: Principal | ICD-10-CM

## 2015-11-13 DIAGNOSIS — K219 Gastro-esophageal reflux disease without esophagitis: Secondary | ICD-10-CM

## 2015-11-13 DIAGNOSIS — I2510 Atherosclerotic heart disease of native coronary artery without angina pectoris: Secondary | ICD-10-CM

## 2015-11-13 DIAGNOSIS — E1165 Type 2 diabetes mellitus with hyperglycemia: Secondary | ICD-10-CM

## 2015-11-16 LAB — CBC WITH DIFFERENTIAL/PLATELET
BASOS ABS: 0 {cells}/uL (ref 0–200)
Basophils Relative: 0 %
Eosinophils Absolute: 77 cells/uL (ref 15–500)
Eosinophils Relative: 1 %
HEMATOCRIT: 35.2 % (ref 35.0–45.0)
HEMOGLOBIN: 11.6 g/dL — AB (ref 11.7–15.5)
LYMPHS ABS: 2002 {cells}/uL (ref 850–3900)
LYMPHS PCT: 26 %
MCH: 25 pg — ABNORMAL LOW (ref 27.0–33.0)
MCHC: 33 g/dL (ref 32.0–36.0)
MCV: 75.9 fL — ABNORMAL LOW (ref 80.0–100.0)
MONO ABS: 385 {cells}/uL (ref 200–950)
MPV: 10.1 fL (ref 7.5–12.5)
Monocytes Relative: 5 %
NEUTROS PCT: 68 %
Neutro Abs: 5236 cells/uL (ref 1500–7800)
Platelets: 179 10*3/uL (ref 140–400)
RBC: 4.64 MIL/uL (ref 3.80–5.10)
RDW: 15.2 % — AB (ref 11.0–15.0)
WBC: 7.7 10*3/uL (ref 3.8–10.8)

## 2015-11-17 LAB — COMPLETE METABOLIC PANEL WITH GFR
ALBUMIN: 4 g/dL (ref 3.6–5.1)
ALK PHOS: 78 U/L (ref 33–130)
ALT: 17 U/L (ref 6–29)
AST: 18 U/L (ref 10–35)
BILIRUBIN TOTAL: 0.5 mg/dL (ref 0.2–1.2)
BUN: 11 mg/dL (ref 7–25)
CALCIUM: 9.2 mg/dL (ref 8.6–10.4)
CO2: 27 mmol/L (ref 20–31)
Chloride: 103 mmol/L (ref 98–110)
Creat: 0.49 mg/dL — ABNORMAL LOW (ref 0.50–1.05)
Glucose, Bld: 130 mg/dL — ABNORMAL HIGH (ref 65–99)
Potassium: 4.5 mmol/L (ref 3.5–5.3)
Sodium: 139 mmol/L (ref 135–146)
TOTAL PROTEIN: 6.7 g/dL (ref 6.1–8.1)

## 2015-11-17 LAB — LIPID PANEL
CHOLESTEROL: 108 mg/dL — AB (ref 125–200)
HDL: 28 mg/dL — AB (ref >=46)
LDL Cholesterol: 52 mg/dL (ref <130)
TRIGLYCERIDES: 140 mg/dL (ref <150)
Total CHOL/HDL Ratio: 3.9 Ratio (ref <=5.0)
VLDL: 28 mg/dL (ref <30)

## 2015-11-17 LAB — HEMOGLOBIN A1C
HEMOGLOBIN A1C: 8.8 % — AB (ref <5.7)
MEAN PLASMA GLUCOSE: 206 mg/dL

## 2015-11-20 ENCOUNTER — Encounter: Payer: Self-pay | Admitting: Physician Assistant

## 2015-11-20 ENCOUNTER — Ambulatory Visit: Payer: Self-pay | Admitting: Physician Assistant

## 2015-11-20 VITALS — BP 118/72 | HR 64 | Temp 97.5°F | Ht 63.0 in | Wt 156.0 lb

## 2015-11-20 DIAGNOSIS — I10 Essential (primary) hypertension: Secondary | ICD-10-CM

## 2015-11-20 DIAGNOSIS — K219 Gastro-esophageal reflux disease without esophagitis: Secondary | ICD-10-CM

## 2015-11-20 DIAGNOSIS — E118 Type 2 diabetes mellitus with unspecified complications: Principal | ICD-10-CM

## 2015-11-20 DIAGNOSIS — IMO0002 Reserved for concepts with insufficient information to code with codable children: Secondary | ICD-10-CM

## 2015-11-20 DIAGNOSIS — Z794 Long term (current) use of insulin: Principal | ICD-10-CM

## 2015-11-20 DIAGNOSIS — I2510 Atherosclerotic heart disease of native coronary artery without angina pectoris: Secondary | ICD-10-CM

## 2015-11-20 DIAGNOSIS — E1165 Type 2 diabetes mellitus with hyperglycemia: Secondary | ICD-10-CM

## 2015-11-20 DIAGNOSIS — E785 Hyperlipidemia, unspecified: Secondary | ICD-10-CM

## 2015-11-20 LAB — IFOBT (OCCULT BLOOD): IFOBT: NEGATIVE

## 2015-11-20 NOTE — Progress Notes (Signed)
BP 118/72 (BP Location: Left Arm, Patient Position: Sitting, Cuff Size: Normal)   Pulse 64   Temp 97.5 F (36.4 C) (Other (Comment)) Comment (Src): frontal  Ht _0  (1.6 m)   Wt 156 lb (70.8 kg)   LMP 03/05/2015   SpO2 97%   BMI 27.63 kg/m    Subjective:    Patient ID: Yesenia Hoffman, female    DOB: 01-30-1966, 50 y.o.   MRN: [id number]  HPI: Yesenia Hoffman is a 50 y.o. female presenting on 11/20/2015 for Diabetes   HPI   Her lowest bs was 140.  She hasn't checked it in 2 wk b/c she just moved.  She is feeling well.  She is having some stress b/c her husband has been in the hospital for 6 wk and is still in there.  Pt hasn't seen cards since April 2016.  It appears she was on recall for 1 year and was sent a letter but pt states she never got letter (possible language thing if letter was sent in [place])  Relevant past medical, surgical, family and social history reviewed and updated as indicated. Interim medical history since our last visit reviewed. Allergies and medications reviewed and updated.   Current Outpatient Prescriptions:  .  amLODipine (NORVASC) 10 MG tablet, TAKE ONE TABLET BY MOUTH ONCE DAILY **TOME  UNA  TABLETA  POR  BOCA  DIARIA, Disp: 30 tablet, Rfl: 4 .  aspirin 81 MG tablet, Take 1 tablet (81 mg total) by mouth daily., Disp: , Rfl:  .  atenolol (TENORMIN) 100 MG tablet, TAKE ONE TABLET BY MOUTH ONCE DAILY TOMA  UNA  TABELTA  POR  BOCA  AL  DIA, Disp: 30 tablet, Rfl: 4 .  fexofenadine (ALLEGRA) 180 MG tablet, Take 180 mg by mouth daily., Disp: , Rfl:  .  fish oil-omega-3 fatty acids 1000 MG capsule, Take 2 capsules by mouth 2 (two) times daily. , Disp: , Rfl:  .  glipiZIDE (GLUCOTROL) 10 MG tablet, TAKE TWO TABLETS BY MOUTH TWICE DAILY BEFORE A MEAL. *TOME DOS TABLETAS POR BOCA DOS VECES DIARIAS CON COMIDA*, Disp: 120 tablet, Rfl: 4 .  insulin NPH-regular Human (NOVOLIN 70/30 RELION) (70-30) 100 UNIT/ML injection, Inject 32 units subcutaneously before  breakfast and inject 30 units subcutaneously before supper.  Inyecte 32 unidades subcutaneos antes de el desayuno y inyecte 30 unidades subcutaneos antes de la cena, Disp: 1 vial, Rfl: 4 .  lisinopril-hydrochlorothiazide (PRINZIDE,ZESTORETIC) 20-25 MG tablet, Take 1 tablet by mouth daily. Tome una tableta al dia, Disp: 30 tablet, Rfl: 3 .  metFORMIN (GLUCOPHAGE) 1000 MG tablet, Take 1 tablet (1,000 mg total) by mouth 2 (two) times daily. Tome una tableta por boca dos veces diarias con comida, Disp: 60 tablet, Rfl: 3 .  naproxen (NAPROSYN) 500 MG tablet, Take 1 tablet (500 mg total) by mouth every 12 (twelve) hours. (Patient taking differently: Take 500 mg by mouth every 12 (twelve) hours as needed. ), Disp: 60 tablet, Rfl: 2 .  Sennosides (TGT NATURAL LAXATIVE PILLS) 25 MG TABS, Take 2 tablets by mouth 2 (two) times daily. , Disp: , Rfl:  .  simvastatin (ZOCOR) 20 MG tablet, TAKE ONE TABLET BY MOUTH ONCE DAILY AT BEDTIME **TOME  UNA  TABLETA  POR  LA  BOCA  AL  DORMIR, Disp: 30 tablet, Rfl: 3 .  nitroGLYCERIN (NITROSTAT) 0.4 MG SL tablet, Place 1 tablet (0.4 mg total) under the tongue every 5 (five) minutes as needed for chest pain. (  Patient not taking: Reported on 07/26/2015), Disp: 25 tablet, Rfl: 3 .  ranitidine (ZANTAC) 300 MG tablet, Take 1 tablet (300 mg total) by mouth daily. Tome una tableta por boca diaria (Patient not taking: Reported on 11/20/2015), Disp: 30 tablet, Rfl: 4   Review of Systems  Constitutional: Positive for fatigue. Negative for appetite change, chills, diaphoresis, fever and unexpected weight change.  HENT: Positive for dental problem and sneezing. Negative for congestion, drooling, ear pain, facial swelling, hearing loss, mouth sores, sore throat, trouble swallowing and voice change.   Eyes: Negative for pain, discharge, redness, itching and visual disturbance.  Respiratory: Negative for cough, choking, shortness of breath and wheezing.   Cardiovascular: Positive for leg  swelling. Negative for chest pain and palpitations.  Gastrointestinal: Positive for constipation. Negative for abdominal pain, blood in stool, diarrhea and vomiting.  Endocrine: Positive for heat intolerance. Negative for cold intolerance and polydipsia.  Genitourinary: Negative for decreased urine volume, dysuria and hematuria.  Musculoskeletal: Positive for back pain. Negative for arthralgias and gait problem.  Skin: Negative for rash.  Allergic/Immunologic: Positive for environmental allergies.  Neurological: Negative for seizures, syncope, light-headedness and headaches.  Hematological: Negative for adenopathy.  Psychiatric/Behavioral: Positive for agitation. Negative for dysphoric mood and suicidal ideas. The patient is not nervous/anxious.     Per HPI unless specifically indicated above     Objective:    BP 118/72 (BP Location: Left Arm, Patient Position: Sitting, Cuff Size: Normal)   Pulse 64   Temp 97.5 F (36.4 C) (Other (Comment)) Comment (Src): frontal  Ht _0  (1.6 m)   Wt 156 lb (70.8 kg)   LMP 03/05/2015   SpO2 97%   BMI 27.63 kg/m   Wt Readings from Last 3 Encounters:  11/20/15 156 lb (70.8 kg)  08/21/15 161 lb 3.2 oz (73.1 kg)  07/26/15 160 lb (72.6 kg)    Physical Exam  Constitutional: She is oriented to person, place, and time. She appears well-developed and well-nourished.  HENT:  Head: Normocephalic and atraumatic.  Neck: Neck supple.  Cardiovascular: Normal rate and regular rhythm.   Pulmonary/Chest: Effort normal and breath sounds normal.  Abdominal: Soft. Bowel sounds are normal. She exhibits no mass. There is no hepatosplenomegaly. There is no tenderness.  Musculoskeletal: She exhibits edema (1+ BLE edema).  Lymphadenopathy:    She has no cervical adenopathy.  Neurological: She is alert and oriented to person, place, and time.  Skin: Skin is warm and dry.  Psychiatric: She has a normal mood and affect. Her behavior is normal.  Vitals  reviewed.      Results for orders placed or performed in visit on 11/13/15  CBC w/Diff/Platelet  Result Value Ref Range   WBC 7.7 3.8 - 10.8 K/uL   RBC 4.64 3.80 - 5.10 MIL/uL   Hemoglobin 11.6 (L) 11.7 - 15.5 g/dL   HCT 35.2 35.0 - 45.0 %   MCV 75.9 (L) 80.0 - 100.0 fL   MCH 25.0 (L) 27.0 - 33.0 pg   MCHC 33.0 32.0 - 36.0 g/dL   RDW 15.2 (H) 11.0 - 15.0 %   Platelets 179 140 - 400 K/uL   MPV 10.1 7.5 - 12.5 fL   Neutro Abs 5,236 1,500 - 7,800 cells/uL   Lymphs Abs 2,002 850 - 3,900 cells/uL   Monocytes Absolute 385 200 - 950 cells/uL   Eosinophils Absolute 77 15 - 500 cells/uL   Basophils Absolute 0 0 - 200 cells/uL   Neutrophils Relative % 68 %  Lymphocytes Relative 26 %   Monocytes Relative 5 %   Eosinophils Relative 1 %   Basophils Relative 0 %   Smear Review Criteria for review not met   HgB A1c  Result Value Ref Range   Hgb A1c MFr Bld 8.8 (H) <5.7 %   Mean Plasma Glucose 206 mg/dL  COMPLETE METABOLIC PANEL WITH GFR  Result Value Ref Range   Sodium 139 135 - 146 mmol/L   Potassium 4.5 3.5 - 5.3 mmol/L   Chloride 103 98 - 110 mmol/L   CO2 27 20 - 31 mmol/L   Glucose, Bld 130 (H) 65 - 99 mg/dL   BUN 11 7 - 25 mg/dL   Creat 0.49 (L) 0.50 - 1.05 mg/dL   Total Bilirubin 0.5 0.2 - 1.2 mg/dL   Alkaline Phosphatase 78 33 - 130 U/L   AST 18 10 - 35 U/L   ALT 17 6 - 29 U/L   Total Protein 6.7 6.1 - 8.1 g/dL   Albumin 4.0 3.6 - 5.1 g/dL   Calcium 9.2 8.6 - 10.4 mg/dL   GFR, Est African American >89 >=60 mL/min   GFR, Est Non African American >89 >=60 mL/min  Lipid Profile  Result Value Ref Range   Cholesterol 108 (L) 125 - 200 mg/dL   Triglycerides 140 <150 mg/dL   HDL 28 (L) >=46 mg/dL   Total CHOL/HDL Ratio 3.9 <=5.0 Ratio   VLDL 28 <30 mg/dL   LDL Cholesterol 52 <130 mg/dL      Assessment & Plan:   Encounter Diagnoses  Name Primary?  Marland Kitchen Uncontrolled type 2 diabetes mellitus with complication, with long-term current use of insulin ([place]) Yes  . Coronary  artery disease involving native coronary artery of native heart without angina pectoris   . Essential hypertension   . Hyperlipidemia   . Gastroesophageal reflux disease, esophagitis presence not specified     -reviewed labs with pt -Send pt back to cardiology- she is given another [employer] discount application.  she says hers expired earlier this month -Increase her 70/30 insulin to 35 in the morning and 33 units before supper.  Pt to call for FBS < 70 or > 300. -continue current medications -F/u 3 months.  RTO sooner prn

## 2015-12-20 ENCOUNTER — Other Ambulatory Visit: Payer: Self-pay | Admitting: Physician Assistant

## 2015-12-20 ENCOUNTER — Ambulatory Visit: Payer: Self-pay | Admitting: Cardiology

## 2016-01-02 ENCOUNTER — Encounter: Payer: Self-pay | Admitting: *Deleted

## 2016-01-03 ENCOUNTER — Ambulatory Visit (INDEPENDENT_AMBULATORY_CARE_PROVIDER_SITE_OTHER): Payer: Self-pay | Admitting: Cardiology

## 2016-01-03 ENCOUNTER — Encounter: Payer: Self-pay | Admitting: Cardiology

## 2016-01-03 VITALS — BP 107/67 | HR 52 | Ht 60.0 in | Wt 155.2 lb

## 2016-01-03 DIAGNOSIS — R0602 Shortness of breath: Secondary | ICD-10-CM

## 2016-01-03 DIAGNOSIS — I2510 Atherosclerotic heart disease of native coronary artery without angina pectoris: Secondary | ICD-10-CM

## 2016-01-03 DIAGNOSIS — E785 Hyperlipidemia, unspecified: Secondary | ICD-10-CM

## 2016-01-03 DIAGNOSIS — R600 Localized edema: Secondary | ICD-10-CM

## 2016-01-03 DIAGNOSIS — I10 Essential (primary) hypertension: Secondary | ICD-10-CM

## 2016-01-03 MED ORDER — METOPROLOL TARTRATE 25 MG PO TABS: 12.5000 mg | ORAL_TABLET | Freq: Two times a day (BID) | ORAL | 3 refills | Status: DC

## 2016-01-03 NOTE — Progress Notes (Addendum)
Clinical Summary Ms. Sciascia is a 50 y.o.female seen today for follow up of the following medical problems. She is spanish speaking, history is obtained with assistance of her husband who speaks english.   1. CAD  - prior stent in 2004 to LAD, repeat cath 02/2007 with no obstructive disease  - Last visit with Dr Johnsie Cancel in 2014 she described some chest pain, referred for stress test.  - GXT 07/2012 68% of THR, 7 METs, stopped due to fatigue. No chest pain. Non-significant ST/T changes  - admit to [facility] 07/2013 with chest. Pain during that admission cath showed patent coronaries and stent.    - no recent chest pain. No significant SOB or DOE.  - compliant with meds   2. HTN - compliant with meds  3. Hyperlipidemia - 10/2015 TC 108 TG 140 HDL 28 LDL 52  4. Sinus bradycardia - EKG in clinic rate 52. - reports 4 months ago having some occasional dizziness.   5. LE edema - started about 6 months ago. Intermittent. No increased SOB or DOE.    7. Generalized fatigue - poor sleep habits, only sleeps 4 hours a night - noted sinus bradycardia on exam as well, unclear if playing a role.   Past Medical History:  Diagnosis Date  . Anemia   . CAD (coronary artery disease) 07/2013   stent placed  . GERD (gastroesophageal reflux disease)   . Migraine   . Obesity, unspecified   . Other and unspecified hyperlipidemia   . Type II or unspecified type diabetes mellitus without mention of complication, uncontrolled   . Unspecified essential hypertension      Allergies  Allergen Reactions  . Pollen Extract Other (See Comments)    Congestion      Current Outpatient Prescriptions  Medication Sig Dispense Refill  . amLODipine (NORVASC) 10 MG tablet TAKE ONE TABLET BY MOUTH ONCE DAILY **TOME  UNA  TABLETA  POR  BOCA  DIARIA 30 tablet 4  . aspirin 81 MG tablet Take 1 tablet (81 mg total) by mouth daily.    Marland Kitchen atenolol (TENORMIN) 100 MG tablet TAKE ONE TABLET BY MOUTH  ONCE DAILY TOMA  UNA  TABELTA  POR  BOCA  AL  DIA 30 tablet 4  . fexofenadine (ALLEGRA) 180 MG tablet Take 180 mg by mouth daily.    . fish oil-omega-3 fatty acids 1000 MG capsule Take 2 capsules by mouth 2 (two) times daily.     Marland Kitchen glipiZIDE (GLUCOTROL) 10 MG tablet TAKE TWO TABLETS BY MOUTH TWICE DAILY BEFORE A MEAL. *TOME DOS TABLETAS POR BOCA DOS VECES DIARIAS CON COMIDA* 120 tablet 4  . insulin NPH-regular Human (NOVOLIN 70/30 RELION) (70-30) 100 UNIT/ML injection Inject 32 units subcutaneously before breakfast and inject 30 units subcutaneously before supper.  Inyecte 32 unidades subcutaneos antes de el desayuno y inyecte 30 unidades subcutaneos antes de la cena 1 vial 4  . lisinopril-hydrochlorothiazide (PRINZIDE,ZESTORETIC) 20-25 MG tablet Take 1 tablet by mouth daily. Tome una tableta al dia 30 tablet 3  . metFORMIN (GLUCOPHAGE) 1000 MG tablet TAKE ONE TABLET BY MOUTH TWICE DAILY **TOME UNA TALBETA POR BOCA DOS VECES DIARIAS CON COMIDA** 60 tablet 3  . naproxen (NAPROSYN) 500 MG tablet Take 1 tablet (500 mg total) by mouth every 12 (twelve) hours. (Patient taking differently: Take 500 mg by mouth every 12 (twelve) hours as needed. ) 60 tablet 2  . nitroGLYCERIN (NITROSTAT) 0.4 MG SL tablet Place 1 tablet (0.4 mg  total) under the tongue every 5 (five) minutes as needed for chest pain. (Patient not taking: Reported on 07/26/2015) 25 tablet 3  . ranitidine (ZANTAC) 300 MG tablet Take 1 tablet (300 mg total) by mouth daily. Tome una tableta por boca diaria (Patient not taking: Reported on 11/20/2015) 30 tablet 4  . Sennosides (TGT NATURAL LAXATIVE PILLS) 25 MG TABS Take 2 tablets by mouth 2 (two) times daily.     . simvastatin (ZOCOR) 20 MG tablet TAKE ONE TABLET BY MOUTH ONCE DAILY AT BEDTIME **TOME  UNA  TABLETA  POR  LA  BOCA  AL  DORMIR 30 tablet 3   No current facility-administered medications for this visit.      Past Surgical History:  Procedure Laterality Date  . CARDIAC CATHETERIZATION     . CESAREAN SECTION  1982, 1985     Allergies  Allergen Reactions  . Pollen Extract Other (See Comments)    Congestion       Family History  Problem Relation Age of Onset  . Diabetes Mother   . Hypertension Mother   . Cancer Sister   . Cancer Sister      Social History Ms. Wages reports that she has quit smoking. She has never used smokeless tobacco. Ms. Butka reports that she drinks alcohol.   Review of Systems CONSTITUTIONAL: +generalized fatgiue HEENT: Eyes: No visual loss, blurred vision, double vision or yellow sclerae.No hearing loss, sneezing, congestion, runny nose or sore throat.  SKIN: No rash or itching.  CARDIOVASCULAR: per HPI RESPIRATORY: No shortness of breath, cough or sputum.  GASTROINTESTINAL: No anorexia, nausea, vomiting or diarrhea. No abdominal pain or blood.  GENITOURINARY: No burning on urination, no polyuria NEUROLOGICAL: occas dizziness MUSCULOSKELETAL: No muscle, back pain, joint pain or stiffness.  LYMPHATICS: No enlarged nodes. No history of splenectomy.  PSYCHIATRIC: No history of depression or anxiety.  ENDOCRINOLOGIC: No reports of sweating, cold or heat intolerance. No polyuria or polydipsia.  Marland Kitchen   Physical Examination Vitals:   01/03/16 0855  BP: 107/67  Pulse: (!) 52   Vitals:   01/03/16 0855  Weight: 155 lb 3.2 oz (70.4 kg)  Height: 5' (1.524 m)    Gen: resting comfortably, no acute distress HEENT: no scleral icterus, pupils equal round and reactive, no palptable cervical adenopathy,  CV: regular, rate 55, 2/6 systolic murmur at apex, no jvd Resp: Clear to auscultation bilaterally GI: abdomen is soft, non-tender, non-distended, normal bowel sounds, no hepatosplenomegaly MSK: extremities are warm, no edema.  Skin: warm, no rash Neuro:  no focal deficits Psych: appropriate affect   Diagnostic Studies [facility] 07/2013 Cath  RESULTS:   Left Main-normal Left Anterior Descending-proximal LAD stent is widely  patent, D1 normal Left Circumflex-normal as were the 2 obtuse marginal branches. Right Coronary-large dominant vessel which was normal Normal left ventricular chamber size with normal wall motion the estimated EF is 60%  CONCLUSIONS:  1. Single vessel coronary disease which is nonobstructive 2. LAD stent is widely patent. 3. Normal left ventricular chamber size with normal wall motion, the estimated ejection fraction is 60%.     Assessment and Plan  1. CAD/Chest pain  - 07/2013 [facility] with patent coronaries and stent  - no current symptoms - we will continue current meds  2. HTN - at goal, she  will continue current meds  3. HL  Cost is issue for her meds, will keep on current statin. LDL has been at goal  4. Sinus bradycardia - EKG in  clinic today shows sinus bradycardia.  - we will lower her beta blocker dose. Due to back order on atenolol, change to lopressor 12.5mg  bid  5. Generalized fatigue - lowering beta blocker as described above. Encouraged better sleep hygiene  6. LE edema - start prn lasix. Given new onset, recheck echo given her history of heart disease  F/u 6 months    Arnoldo Lenis, M.D.

## 2016-01-03 NOTE — Patient Instructions (Signed)
Your physician wants you to follow-up in: Yes[place] Hoffman DR. [facility]ANCH You will receive a reminder letter in the mail two months in advance. If you don't receive a letter, please call our office to schedule the follow-up appointment.  Your physician has recommended you make the following change in your medication:   STOP ATENOLOL   START METOPROLOL (LOPRESSOR) 12.5 MG TWICE DAILY  Your physician has requested that you have an echocardiogram. Echocardiography is a painless test that uses sound waves to create images of your heart. It provides your doctor with information about the size and shape of your heart and how well your heart's chambers and valves are working. This procedure takes approximately one hour. There are no restrictions for this procedure.   Thank you for choosing [facility]!!

## 2016-01-22 ENCOUNTER — Other Ambulatory Visit: Payer: Self-pay | Admitting: Physician Assistant

## 2016-01-28 ENCOUNTER — Ambulatory Visit: Payer: Self-pay

## 2016-02-07 ENCOUNTER — Other Ambulatory Visit: Payer: Self-pay

## 2016-02-21 ENCOUNTER — Other Ambulatory Visit: Payer: Self-pay | Admitting: Student

## 2016-02-21 DIAGNOSIS — E785 Hyperlipidemia, unspecified: Secondary | ICD-10-CM

## 2016-02-21 DIAGNOSIS — I2510 Atherosclerotic heart disease of native coronary artery without angina pectoris: Secondary | ICD-10-CM

## 2016-02-21 DIAGNOSIS — IMO0002 Reserved for concepts with insufficient information to code with codable children: Secondary | ICD-10-CM

## 2016-02-21 DIAGNOSIS — Z794 Long term (current) use of insulin: Principal | ICD-10-CM

## 2016-02-21 DIAGNOSIS — I10 Essential (primary) hypertension: Secondary | ICD-10-CM

## 2016-02-21 DIAGNOSIS — E118 Type 2 diabetes mellitus with unspecified complications: Principal | ICD-10-CM

## 2016-02-21 DIAGNOSIS — E1165 Type 2 diabetes mellitus with hyperglycemia: Secondary | ICD-10-CM

## 2016-02-23 ENCOUNTER — Other Ambulatory Visit: Payer: Self-pay | Admitting: Physician Assistant

## 2016-02-26 ENCOUNTER — Ambulatory Visit: Payer: Self-pay | Admitting: Physician Assistant

## 2016-02-29 LAB — HEMOGLOBIN A1C
Hgb A1c MFr Bld: 8.5 % — ABNORMAL HIGH (ref <5.7)
MEAN PLASMA GLUCOSE: 197 mg/dL

## 2016-03-01 ENCOUNTER — Other Ambulatory Visit: Payer: Self-pay | Admitting: Physician Assistant

## 2016-03-01 LAB — LIPID PANEL
Cholesterol: 119 mg/dL — ABNORMAL LOW (ref 125–200)
HDL: 25 mg/dL — ABNORMAL LOW
LDL Cholesterol: 62 mg/dL
Total CHOL/HDL Ratio: 4.8 ratio
Triglycerides: 162 mg/dL — ABNORMAL HIGH
VLDL: 32 mg/dL — ABNORMAL HIGH

## 2016-03-01 LAB — COMPLETE METABOLIC PANEL WITH GFR
ALK PHOS: 89 U/L (ref 33–130)
ALT: 13 U/L (ref 6–29)
AST: 16 U/L (ref 10–35)
Albumin: 4.2 g/dL (ref 3.6–5.1)
BUN: 10 mg/dL (ref 7–25)
CHLORIDE: 100 mmol/L (ref 98–110)
CO2: 26 mmol/L (ref 20–31)
Calcium: 9.1 mg/dL (ref 8.6–10.4)
Creat: 0.49 mg/dL — ABNORMAL LOW (ref 0.50–1.05)
GFR, Est African American: >89 mL/min (ref >=60)
Glucose, Bld: 191 mg/dL — ABNORMAL HIGH (ref 65–99)
POTASSIUM: 4.3 mmol/L (ref 3.5–5.3)
SODIUM: 137 mmol/L (ref 135–146)
TOTAL PROTEIN: 7.5 g/dL (ref 6.1–8.1)
Total Bilirubin: 0.4 mg/dL (ref 0.2–1.2)

## 2016-03-01 LAB — MICROALBUMIN, URINE: Microalb, Ur: 1.6 mg/dL

## 2016-03-06 ENCOUNTER — Ambulatory Visit: Payer: Self-pay | Admitting: Physician Assistant

## 2016-03-06 ENCOUNTER — Encounter: Payer: Self-pay | Admitting: Physician Assistant

## 2016-03-06 VITALS — BP 144/78 | HR 82 | Temp 97.5°F | Ht 63.0 in | Wt 153.4 lb

## 2016-03-06 DIAGNOSIS — Z794 Long term (current) use of insulin: Principal | ICD-10-CM

## 2016-03-06 DIAGNOSIS — IMO0002 Reserved for concepts with insufficient information to code with codable children: Secondary | ICD-10-CM

## 2016-03-06 DIAGNOSIS — E118 Type 2 diabetes mellitus with unspecified complications: Secondary | ICD-10-CM

## 2016-03-06 DIAGNOSIS — K219 Gastro-esophageal reflux disease without esophagitis: Secondary | ICD-10-CM

## 2016-03-06 DIAGNOSIS — E1165 Type 2 diabetes mellitus with hyperglycemia: Principal | ICD-10-CM

## 2016-03-06 DIAGNOSIS — E785 Hyperlipidemia, unspecified: Secondary | ICD-10-CM

## 2016-03-06 DIAGNOSIS — I2510 Atherosclerotic heart disease of native coronary artery without angina pectoris: Secondary | ICD-10-CM

## 2016-03-06 DIAGNOSIS — I10 Essential (primary) hypertension: Secondary | ICD-10-CM

## 2016-03-06 DIAGNOSIS — Z1239 Encounter for other screening for malignant neoplasm of breast: Secondary | ICD-10-CM

## 2016-03-06 MED ORDER — LISINOPRIL-HYDROCHLOROTHIAZIDE 20-25 MG PO TABS: ORAL_TABLET | ORAL | 4 refills | Status: DC

## 2016-03-06 MED ORDER — METOPROLOL TARTRATE 25 MG PO TABS: ORAL_TABLET | ORAL | 3 refills | Status: DC

## 2016-03-06 MED ORDER — NAPROXEN 500 MG PO TABS: ORAL_TABLET | ORAL | 2 refills | Status: DC

## 2016-03-06 NOTE — Progress Notes (Signed)
BP (!) 144/78 (BP Location: Left Arm, Patient Position: Sitting, Cuff Size: Normal)   Pulse 82   Temp 97.5 F (36.4 C) (Other (Comment))   Ht 5\' 3"  (1.6 m)   Wt 153 lb 6.4 oz (69.6 kg)   LMP 03/05/2015   SpO2 98%   BMI 27.17 kg/m    Subjective:    Patient ID: Yesenia Hoffman, female    DOB: 02/09/1966, 50 y.o.   MRN: [id number]  HPI: Yesenia Hoffman is a 50 y.o. female presenting on 03/06/2016 for Diabetes   HPI   Pt husband still in hospital since September.  She is stressed about this.  He had 1 AKA and will be having the other this week.    Pt only taking her metoprolol once daily.  Pt has appt for echo next week (she saw cards in sept).  Pt says she still has active [employer].  She says her letter is at home somewhere  Pt admits to not using insulin as she should due to her sick husband.  Pt has frineds that help her with the stress.    Relevant past medical, surgical, family and social history reviewed and updated as indicated. Interim medical history since our last visit reviewed. Allergies and medications reviewed and updated.   Current Outpatient Prescriptions:  .  amLODipine (NORVASC) 10 MG tablet, TAKE ONE TABLET BY MOUTH ONCE DAILY **TOME UNA TABLETA POR BOCA DIARIA**, Disp: 30 tablet, Rfl: 4 .  aspirin 81 MG tablet, Take 1 tablet (81 mg total) by mouth daily., Disp: , Rfl:  .  fexofenadine (ALLEGRA) 180 MG tablet, Take 180 mg by mouth daily., Disp: , Rfl:  .  fish oil-omega-3 fatty acids 1000 MG capsule, Take 2 capsules by mouth 2 (two) times daily. , Disp: , Rfl:  .  glipiZIDE (GLUCOTROL) 10 MG tablet, TAKE TWO TABLETS BY MOUTH TWICE DAILY BEFORE A MEAL TOME DOS TABLETAS POR BOCA DOS VECES DIARIAS CON COMIDA, Disp: 120 tablet, Rfl: 4 .  insulin NPH-regular Human (NOVOLIN 70/30 RELION) (70-30) 100 UNIT/ML injection, Inject 32 units subcutaneously before breakfast and inject 30 units subcutaneously before supper.  Inyecte 32 unidades subcutaneos antes de el  desayuno y inyecte 30 unidades subcutaneos antes de la cena (Patient taking differently: Inject 35  units subcutaneously before breakfast and inject 32 units subcutaneously before supper.  Inyecte 32 unidades subcutaneos antes de el desayuno y inyecte 30 unidades subcutaneos antes de la cena), Disp: 1 vial, Rfl: 4 .  lisinopril-hydrochlorothiazide (PRINZIDE,ZESTORETIC) 20-25 MG tablet, Take 1 tablet by mouth daily. Tome una tableta por boca diaria, Disp: 30 tablet, Rfl: 4 .  metFORMIN (GLUCOPHAGE) 1000 MG tablet, TAKE ONE TABLET BY MOUTH TWICE DAILY **TOME UNA TALBETA POR BOCA DOS VECES DIARIAS CON COMIDA**, Disp: 60 tablet, Rfl: 3 .  metoprolol tartrate (LOPRESSOR) 25 MG tablet, Take 0.5 tablets (12.5 mg total) by mouth 2 (two) times daily. (Patient taking differently: Take 25 mg by mouth daily. ), Disp: 90 tablet, Rfl: 3 .  naproxen (NAPROSYN) 500 MG tablet, Take 1 tablet (500 mg total) by mouth every 12 (twelve) hours. (Patient taking differently: Take 500 mg by mouth every 12 (twelve) hours as needed. ), Disp: 60 tablet, Rfl: 2 .  nitroGLYCERIN (NITROSTAT) 0.4 MG SL tablet, Place 1 tablet (0.4 mg total) under the tongue every 5 (five) minutes as needed for chest pain., Disp: 25 tablet, Rfl: 3 .  ranitidine (ZANTAC) 300 MG tablet, Take 1 tablet (300 mg total) by  mouth daily. Tome una tableta por boca diaria, Disp: 30 tablet, Rfl: 4 .  Sennosides (TGT NATURAL LAXATIVE PILLS) 25 MG TABS, Take 2 tablets by mouth 2 (two) times daily. , Disp: , Rfl:  .  simvastatin (ZOCOR) 20 MG tablet, TAKE ONE TABLET BY MOUTH ONCE DAILY AT BEDTIME. TOME UNA TABLETA POR LA BOCA AL DORMIR., Disp: 30 tablet, Rfl: 5   Review of Systems  Constitutional: Negative for appetite change, chills, diaphoresis, fatigue, fever and unexpected weight change.  HENT: Negative for congestion, dental problem, drooling, ear pain, facial swelling, hearing loss, mouth sores, sneezing, sore throat, trouble swallowing and voice change.   Eyes:  Negative for pain, discharge, redness, itching and visual disturbance.  Respiratory: Negative for cough, choking, shortness of breath and wheezing.   Cardiovascular: Negative for chest pain, palpitations and leg swelling.  Gastrointestinal: Negative for abdominal pain, blood in stool, constipation, diarrhea and vomiting.  Endocrine: Negative for cold intolerance, heat intolerance and polydipsia.  Genitourinary: Negative for decreased urine volume, dysuria and hematuria.  Musculoskeletal: Negative for arthralgias, back pain and gait problem.  Skin: Negative for rash.  Allergic/Immunologic: Negative for environmental allergies.  Neurological: Negative for seizures, syncope, light-headedness and headaches.  Hematological: Negative for adenopathy.  Psychiatric/Behavioral: Negative for agitation, dysphoric mood and suicidal ideas. The patient is not nervous/anxious.     Per HPI unless specifically indicated above     Objective:    BP (!) 144/78 (BP Location: Left Arm, Patient Position: Sitting, Cuff Size: Normal)   Pulse 82   Temp 97.5 F (36.4 C) (Other (Comment))   Ht 5\' 3"  (1.6 m)   Wt 153 lb 6.4 oz (69.6 kg)   LMP 03/05/2015   SpO2 98%   BMI 27.17 kg/m   Wt Readings from Last 3 Encounters:  03/06/16 153 lb 6.4 oz (69.6 kg)  01/03/16 155 lb 3.2 oz (70.4 kg)  11/20/15 156 lb (70.8 kg)    Physical Exam  Constitutional: She is oriented to person, place, and time. She appears well-developed and well-nourished.  HENT:  Head: Normocephalic and atraumatic.  Neck: Neck supple.  Cardiovascular: Normal rate and regular rhythm.   Pulmonary/Chest: Effort normal and breath sounds normal.  Abdominal: Soft. Bowel sounds are normal. She exhibits no mass. There is no hepatosplenomegaly. There is no tenderness.  Musculoskeletal: She exhibits no edema.  Lymphadenopathy:    She has no cervical adenopathy.  Neurological: She is alert and oriented to person, place, and time.  Skin: Skin is warm  and dry.  Psychiatric: She has a normal mood and affect. Her behavior is normal.  Vitals reviewed.   Results for orders placed or performed in visit on 02/21/16  COMPLETE METABOLIC PANEL WITH GFR  Result Value Ref Range   Sodium 137 135 - 146 mmol/L   Potassium 4.3 3.5 - 5.3 mmol/L   Chloride 100 98 - 110 mmol/L   CO2 26 20 - 31 mmol/L   Glucose, Bld 191 (H) 65 - 99 mg/dL   BUN 10 7 - 25 mg/dL   Creat 0.49 (L) 0.50 - 1.05 mg/dL   Total Bilirubin 0.4 0.2 - 1.2 mg/dL   Alkaline Phosphatase 89 33 - 130 U/L   AST 16 10 - 35 U/L   ALT 13 6 - 29 U/L   Total Protein 7.5 6.1 - 8.1 g/dL   Albumin 4.2 3.6 - 5.1 g/dL   Calcium 9.1 8.6 - 10.4 mg/dL   GFR, Est African American >89 >=60 mL/min  GFR, Est Non African American >89 >=60 mL/min  Microalbumin, urine  Result Value Ref Range   Microalb, Ur 1.6 Not estab mg/dL  Lipid Profile  Result Value Ref Range   Cholesterol 119 (L) 125 - 200 mg/dL   Triglycerides 162 (H) <150 mg/dL   HDL 25 (L) >=46 mg/dL   Total CHOL/HDL Ratio 4.8 <=5.0 Ratio   VLDL 32 (H) <30 mg/dL   LDL Cholesterol 62 <130 mg/dL  HgB A1c  Result Value Ref Range   Hgb A1c MFr Bld 8.5 (H) <5.7 %   Mean Plasma Glucose 197 mg/dL      Assessment & Plan:   Encounter Diagnoses  Name Primary?  Marland Kitchen Uncontrolled type 2 diabetes mellitus with complication, with long-term current use of insulin ([place]) Yes  . Coronary artery disease involving native coronary artery of native heart without angina pectoris   . Essential hypertension   . Hyperlipidemia, unspecified hyperlipidemia type   . Gastroesophageal reflux disease, esophagitis presence not specified   . Screening for breast cancer     -reviewed labs with pt -Pt told to find [employer] letter and take it with her to her echo next week. -screening mammogram -resend rx for metoprolol, naproxen, lisinopril/hctz with instructions in spanish so she can read the instructions -discussed with pt that she needs to take care of  herself first so that she will be able to care for her husband. She states understanding -follow up in 3 months.  RTO sooner prn

## 2016-03-13 ENCOUNTER — Other Ambulatory Visit: Payer: Self-pay

## 2016-03-13 ENCOUNTER — Ambulatory Visit (INDEPENDENT_AMBULATORY_CARE_PROVIDER_SITE_OTHER): Payer: Self-pay

## 2016-03-13 DIAGNOSIS — R0602 Shortness of breath: Secondary | ICD-10-CM

## 2016-03-14 ENCOUNTER — Encounter: Payer: Self-pay | Admitting: *Deleted

## 2016-03-14 ENCOUNTER — Telehealth: Payer: Self-pay | Admitting: *Deleted

## 2016-03-14 NOTE — Telephone Encounter (Signed)
Lm on VM - mailed pt letter routed to pcp

## 2016-03-14 NOTE — Telephone Encounter (Signed)
-----   Message from Drema Dallas, [place] sent at 03/13/2016  4:41 PM EST ----- Ledell Noss pt ----- Message ----- From: Imogene Burn, PA-C Sent: 03/13/2016   3:48 PM To: Soyla Dryer, PA-C, Marikay Alar [place], LPN  Normal 2-D echo

## 2016-03-29 ENCOUNTER — Other Ambulatory Visit: Payer: Self-pay | Admitting: Physician Assistant

## 2016-04-02 ENCOUNTER — Other Ambulatory Visit: Payer: Self-pay | Admitting: Physician Assistant

## 2016-04-02 ENCOUNTER — Ambulatory Visit (HOSPITAL_COMMUNITY)
Admission: RE | Admit: 2016-04-02 | Discharge: 2016-04-02 | Disposition: A | Discharge status: Home / Self Care | Payer: Self-pay | Source: Ambulatory Visit | Attending: Physician Assistant | Admitting: Physician Assistant

## 2016-04-02 DIAGNOSIS — Z1231 Encounter for screening mammogram for malignant neoplasm of breast: Secondary | ICD-10-CM | POA: Insufficient documentation

## 2016-04-02 DIAGNOSIS — Z1239 Encounter for other screening for malignant neoplasm of breast: Secondary | ICD-10-CM

## 2016-04-24 ENCOUNTER — Other Ambulatory Visit: Payer: Self-pay | Admitting: Physician Assistant

## 2016-06-10 ENCOUNTER — Ambulatory Visit: Payer: Self-pay | Admitting: Physician Assistant

## 2016-06-11 ENCOUNTER — Ambulatory Visit: Payer: Self-pay | Admitting: Physician Assistant

## 2016-06-11 ENCOUNTER — Encounter: Payer: Self-pay | Admitting: Physician Assistant

## 2016-06-11 ENCOUNTER — Other Ambulatory Visit: Payer: Self-pay

## 2016-06-11 VITALS — BP 144/76 | HR 75 | Temp 97.3°F | Ht 63.0 in | Wt 154.0 lb

## 2016-06-11 DIAGNOSIS — I2510 Atherosclerotic heart disease of native coronary artery without angina pectoris: Secondary | ICD-10-CM

## 2016-06-11 DIAGNOSIS — R109 Unspecified abdominal pain: Secondary | ICD-10-CM

## 2016-06-11 DIAGNOSIS — E1165 Type 2 diabetes mellitus with hyperglycemia: Secondary | ICD-10-CM

## 2016-06-11 DIAGNOSIS — E118 Type 2 diabetes mellitus with unspecified complications: Secondary | ICD-10-CM

## 2016-06-11 DIAGNOSIS — E785 Hyperlipidemia, unspecified: Secondary | ICD-10-CM

## 2016-06-11 DIAGNOSIS — Z8742 Personal history of other diseases of the female genital tract: Secondary | ICD-10-CM

## 2016-06-11 DIAGNOSIS — Z794 Long term (current) use of insulin: Secondary | ICD-10-CM

## 2016-06-11 DIAGNOSIS — R1032 Left lower quadrant pain: Secondary | ICD-10-CM

## 2016-06-11 DIAGNOSIS — IMO0002 Reserved for concepts with insufficient information to code with codable children: Secondary | ICD-10-CM

## 2016-06-11 DIAGNOSIS — I10 Essential (primary) hypertension: Secondary | ICD-10-CM

## 2016-06-11 DIAGNOSIS — D259 Leiomyoma of uterus, unspecified: Secondary | ICD-10-CM

## 2016-06-11 LAB — GLUCOSE, POCT (MANUAL RESULT ENTRY): POC Glucose: 208 mg/dl — AB (ref 70–99)

## 2016-06-11 LAB — POCT URINALYSIS DIPSTICK
BILIRUBIN UA: NEGATIVE
Leukocytes, UA: NEGATIVE
Nitrite, UA: NEGATIVE
RBC UA: NEGATIVE
Urobilinogen, UA: 0.2
pH, UA: 5

## 2016-06-11 LAB — COMPREHENSIVE METABOLIC PANEL
ALBUMIN: 4.1 g/dL (ref 3.6–5.1)
ALT: 16 U/L (ref 6–29)
AST: 15 U/L (ref 10–35)
Alkaline Phosphatase: 92 U/L (ref 33–130)
BUN: 12 mg/dL (ref 7–25)
CALCIUM: 9.2 mg/dL (ref 8.6–10.4)
CHLORIDE: 101 mmol/L (ref 98–110)
CO2: 27 mmol/L (ref 20–31)
Creat: 0.52 mg/dL (ref 0.50–1.05)
Glucose, Bld: 230 mg/dL — ABNORMAL HIGH (ref 65–99)
POTASSIUM: 4 mmol/L (ref 3.5–5.3)
Sodium: 138 mmol/L (ref 135–146)
Total Bilirubin: 0.4 mg/dL (ref 0.2–1.2)
Total Protein: 7 g/dL (ref 6.1–8.1)

## 2016-06-11 LAB — LIPID PANEL
Cholesterol: 115 mg/dL (ref <200)
HDL: 29 mg/dL — AB (ref >50)
LDL CALC: 62 mg/dL (ref <100)
TRIGLYCERIDES: 119 mg/dL (ref <150)
Total CHOL/HDL Ratio: 4 Ratio (ref <5.0)
VLDL: 24 mg/dL (ref <30)

## 2016-06-11 NOTE — Progress Notes (Signed)
BP (!) 144/76 (BP Location: Left Arm, Patient Position: Sitting, Cuff Size: Normal)   Pulse 75   Temp 97.3 F (36.3 C) (Other (Comment))   Ht 5\' 3"  (1.6 m)   Wt 154 lb (69.9 kg)   LMP 03/05/2015   SpO2 97%   BMI 27.28 kg/m    Subjective:    Patient ID: Yesenia Hoffman, female    DOB: [birth year], 51 y.o.   MRN: [id number]  HPI: Yesenia Hoffman is a 52 y.o. female presenting on 06/11/2016 for Abdominal Pain (states husband died a week ago, very sad)   HPI  abd pain started about a week ago.  Pain is comes and goes.   Pain lasts for about a minute.  Then 3 or 4 minutes without pain and then the pain returns.   Pain on the left side.   Pt no longer taking her rantiidine.  She hasn't been able to pick up refill due to her husband's death.  She is taking her insulin and all her other medicines but admits that she hasn't been checking her bs and hasn't been eating properly.    Pt rescheduled her DM follow-up appointment from this week to 07-01-2022 due to the death.    No emesis.  No nausea. Some diarrhea- had 2 days on mon and tues-  She thinks it was from changing diet/hospital food.  2 episodes mon and 3 tues. No blood in diarrhea.  Today had normal BM.   Hx fibroids and ovarian cyst  Relevant past medical, surgical, family and social history reviewed and updated as indicated. Interim medical history since our last visit reviewed. Allergies and medications reviewed and updated.   Current Outpatient Prescriptions:  .  amLODipine (NORVASC) 10 MG tablet, TAKE ONE TABLET BY MOUTH ONCE DAILY **TOME UNA TABLETA POR BOCA DIARIA**, Disp: 30 tablet, Rfl: 4 .  aspirin 81 MG tablet, Take 1 tablet (81 mg total) by mouth daily., Disp: , Rfl:  .  fexofenadine (ALLEGRA) 180 MG tablet, Take 180 mg by mouth daily., Disp: , Rfl:  .  fish oil-omega-3 fatty acids 1000 MG capsule, Take 2 capsules by mouth 2 (two) times daily. , Disp: , Rfl:  .  glipiZIDE (GLUCOTROL) 10 MG tablet, TAKE TWO TABLETS BY MOUTH  TWICE DAILY BEFORE A MEAL TOME DOS TABLETAS POR BOCA DOS VECES DIARIAS CON COMIDA, Disp: 120 tablet, Rfl: 4 .  insulin NPH-regular Human (NOVOLIN 70/30 RELION) (70-30) 100 UNIT/ML injection, Inject 32 units subcutaneously before breakfast and inject 30 units subcutaneously before supper.  Inyecte 32 unidades subcutaneos antes de el desayuno y inyecte 30 unidades subcutaneos antes de la cena (Patient taking differently: Inject 35  units subcutaneously before breakfast and inject 32 units subcutaneously before supper.  Inyecte 32 unidades subcutaneos antes de el desayuno y inyecte 30 unidades subcutaneos antes de la cena), Disp: 1 vial, Rfl: 4 .  lisinopril-hydrochlorothiazide (PRINZIDE,ZESTORETIC) 20-25 MG tablet, 1 po qd. Tome una tableta por boca diaria, Disp: 30 tablet, Rfl: 4 .  metFORMIN (GLUCOPHAGE) 1000 MG tablet, TAKE ONE TABLET BY MOUTH TWICE DAILY TOME  UNA  TALBETA  POR  BOCA  DOS  VECES  DIARIAS  CON  COMIDA, Disp: 60 tablet, Rfl: 3 .  metoprolol tartrate (LOPRESSOR) 25 MG tablet, 1/2 tab po bid.  Tome media tableta por boca dos veces diarias, Disp: 30 tablet, Rfl: 3 .  Sennosides (TGT NATURAL LAXATIVE PILLS) 25 MG TABS, Take 2 tablets by mouth 2 (two) times daily. ,  Disp: , Rfl:  .  simvastatin (ZOCOR) 20 MG tablet, TAKE ONE TABLET BY MOUTH ONCE DAILY AT BEDTIME. TOME UNA TABLETA POR LA BOCA AL DORMIR., Disp: 30 tablet, Rfl: 5 .  naproxen (NAPROSYN) 500 MG tablet, 1 po q 12 hours prn pain.  Tome una tableta por boca cada 12 horas cuando sea necesario para el dolor, Disp: 60 tablet, Rfl: 2 .  nitroGLYCERIN (NITROSTAT) 0.4 MG SL tablet, Place 1 tablet (0.4 mg total) under the tongue every 5 (five) minutes as needed for chest pain. (Patient not taking: Reported on 06/11/2016), Disp: 25 tablet, Rfl: 3 .  ranitidine (ZANTAC) 300 MG tablet, 1 po qd.  Tome una tableta por boca diaria, Disp: 30 tablet, Rfl: 4   Review of Systems  Constitutional: Positive for chills and fatigue. Negative for appetite  change, diaphoresis, fever and unexpected weight change.  HENT: Positive for dental problem and mouth sores. Negative for congestion, drooling, ear pain, facial swelling, hearing loss, sneezing, sore throat, trouble swallowing and voice change.   Eyes: Negative for pain, discharge, redness, itching and visual disturbance.  Respiratory: Negative for cough, choking, shortness of breath and wheezing.   Cardiovascular: Negative for chest pain, palpitations and leg swelling.  Gastrointestinal: Positive for abdominal pain. Negative for blood in stool, constipation, diarrhea and vomiting.  Endocrine: Negative for cold intolerance, heat intolerance and polydipsia.  Genitourinary: Negative for decreased urine volume, dysuria and hematuria.  Musculoskeletal: Negative for arthralgias, back pain and gait problem.  Skin: Negative for rash.  Allergic/Immunologic: Negative for environmental allergies.  Neurological: Positive for headaches. Negative for seizures, syncope and light-headedness.  Hematological: Negative for adenopathy.  Psychiatric/Behavioral: Negative for agitation, dysphoric mood and suicidal ideas. The patient is not nervous/anxious.     Per HPI unless specifically indicated above     Objective:    BP (!) 144/76 (BP Location: Left Arm, Patient Position: Sitting, Cuff Size: Normal)   Pulse 75   Temp 97.3 F (36.3 C) (Other (Comment))   Ht 5\' 3"  (1.6 m)   Wt 154 lb (69.9 kg)   LMP 03/05/2015   SpO2 97%   BMI 27.28 kg/m   Wt Readings from Last 3 Encounters:  06/11/16 154 lb (69.9 kg)  03/06/16 153 lb 6.4 oz (69.6 kg)  01/03/16 155 lb 3.2 oz (70.4 kg)    Physical Exam  Constitutional: She is oriented to person, place, and time. She appears well-developed and well-nourished.  HENT:  Head: Normocephalic and atraumatic.  Neck: Neck supple.  Cardiovascular: Normal rate and regular rhythm.   Pulmonary/Chest: Effort normal and breath sounds normal.  Abdominal: Soft. Bowel sounds are  normal. She exhibits no distension, no fluid wave, no ascites and no mass. There is no hepatosplenomegaly. There is tenderness in the left lower quadrant. There is no rigidity, no rebound, no guarding and no CVA tenderness.  Very mild tenderness LLQ.  Musculoskeletal: She exhibits no edema.  Lymphadenopathy:    She has no cervical adenopathy.  Neurological: She is alert and oriented to person, place, and time.  Skin: Skin is warm and dry.  Psychiatric: She has a normal mood and affect. Her behavior is normal.  Vitals reviewed.   Results for orders placed or performed in visit on 02/21/16  COMPLETE METABOLIC PANEL WITH GFR  Result Value Ref Range   Sodium 137 135 - 146 mmol/L   Potassium 4.3 3.5 - 5.3 mmol/L   Chloride 100 98 - 110 mmol/L   CO2 26 20 - 31 mmol/L  Glucose, Bld 191 (H) 65 - 99 mg/dL   BUN 10 7 - 25 mg/dL   Creat 0.49 (L) 0.50 - 1.05 mg/dL   Total Bilirubin 0.4 0.2 - 1.2 mg/dL   Alkaline Phosphatase 89 33 - 130 U/L   AST 16 10 - 35 U/L   ALT 13 6 - 29 U/L   Total Protein 7.5 6.1 - 8.1 g/dL   Albumin 4.2 3.6 - 5.1 g/dL   Calcium 9.1 8.6 - 10.4 mg/dL   GFR, Est African American >89 >=60 mL/min   GFR, Est Non African American >89 >=60 mL/min  Microalbumin, urine  Result Value Ref Range   Microalb, Ur 1.6 Not estab mg/dL  Lipid Profile  Result Value Ref Range   Cholesterol 119 (L) 125 - 200 mg/dL   Triglycerides 162 (H) <150 mg/dL   HDL 25 (L) >=46 mg/dL   Total CHOL/HDL Ratio 4.8 <=5.0 Ratio   VLDL 32 (H) <30 mg/dL   LDL Cholesterol 62 <130 mg/dL  HgB A1c  Result Value Ref Range   Hgb A1c MFr Bld 8.5 (H) <5.7 %   Mean Plasma Glucose 197 mg/dL      Assessment & Plan:   Encounter Diagnoses  Name Primary?  Marland Kitchen LLQ abdominal pain Yes  . Flank pain   . Uncontrolled type 2 diabetes mellitus with complication, with long-term current use of insulin ([place])   . Controlled diabetes mellitus type 2 with complications, unspecified long term insulin use status ([place])    . Uterine leiomyoma, unspecified location   . History of ovarian cyst     -disucussed likely ovarian cyst as pt had [place] 06/2015 and 03/2015.  Discussed not necessary to repeat but she wants to get one -Counseled pt to care for her dm -will Order [place] -Pt given [employer] discount application -F/u later this month as scheduled.  RTO sooner prn worsening or new symptoms

## 2016-06-12 LAB — HEMOGLOBIN A1C
HEMOGLOBIN A1C: 9.5 % — AB (ref <5.7)
Mean Plasma Glucose: 226 mg/dL

## 2016-06-17 ENCOUNTER — Other Ambulatory Visit: Payer: Self-pay | Admitting: Physician Assistant

## 2016-06-17 DIAGNOSIS — R1032 Left lower quadrant pain: Secondary | ICD-10-CM

## 2016-06-22 ENCOUNTER — Other Ambulatory Visit: Payer: Self-pay | Admitting: Physician Assistant

## 2016-06-23 ENCOUNTER — Encounter: Payer: Self-pay | Admitting: Physician Assistant

## 2016-06-23 ENCOUNTER — Ambulatory Visit: Payer: Self-pay | Admitting: Physician Assistant

## 2016-06-23 VITALS — BP 130/76 | HR 70 | Temp 97.5°F | Ht 59.25 in | Wt 152.5 lb

## 2016-06-23 DIAGNOSIS — R1032 Left lower quadrant pain: Secondary | ICD-10-CM

## 2016-06-23 DIAGNOSIS — F4321 Adjustment disorder with depressed mood: Secondary | ICD-10-CM

## 2016-06-23 DIAGNOSIS — E1165 Type 2 diabetes mellitus with hyperglycemia: Secondary | ICD-10-CM

## 2016-06-23 DIAGNOSIS — Z794 Long term (current) use of insulin: Principal | ICD-10-CM

## 2016-06-23 DIAGNOSIS — IMO0002 Reserved for concepts with insufficient information to code with codable children: Secondary | ICD-10-CM

## 2016-06-23 DIAGNOSIS — E118 Type 2 diabetes mellitus with unspecified complications: Principal | ICD-10-CM

## 2016-06-23 MED ORDER — RANITIDINE HCL 300 MG PO TABS: ORAL_TABLET | ORAL | 4 refills | Status: DC

## 2016-06-23 NOTE — Progress Notes (Signed)
BP 130/76 (BP Location: Left Arm, Patient Position: Sitting, Cuff Size: Normal)   Pulse 70   Temp 97.5 F (36.4 C)   Ht 4' 11.25" (1.505 m)   Wt 152 lb 8 oz (69.2 kg)   LMP 03/05/2015   SpO2 93%   BMI 30.54 kg/m    Subjective:    Patient ID: Yesenia Hoffman, female    DOB: Sep 09, 1965, 51 y.o.   MRN: [id number]  HPI: Yesenia Hoffman is a 51 y.o. female presenting on 06/23/2016 for Diabetes and Follow-up (pt needs refill on ranitidine)   HPI   Pt is still having abdominal pain but says it is less.   She has [place] scheduled for Wednesday.   Pt says she just started back to taking care of herself (her husband died recently).  States still sometimes cries.  She has some family and friends that help her.   BS log reviewed- ranges  252-183.    Relevant past medical, surgical, family and social history reviewed and updated as indicated. Interim medical history since our last visit reviewed. Allergies and medications reviewed and updated.   Current Outpatient Prescriptions:  .  amLODipine (NORVASC) 10 MG tablet, TAKE ONE TABLET BY MOUTH ONCE DAILY **TOME UNA TABLETA POR BOCA DIARIA**, Disp: 30 tablet, Rfl: 4 .  aspirin 81 MG tablet, Take 1 tablet (81 mg total) by mouth daily., Disp: , Rfl:  .  fexofenadine (ALLEGRA) 180 MG tablet, Take 180 mg by mouth daily., Disp: , Rfl:  .  fish oil-omega-3 fatty acids 1000 MG capsule, Take 2 capsules by mouth 2 (two) times daily. , Disp: , Rfl:  .  glipiZIDE (GLUCOTROL) 10 MG tablet, TAKE TWO TABLETS BY MOUTH TWICE DAILY BEFORE A MEAL TOME DOS TABLETAS POR BOCA DOS VECES DIARIAS CON COMIDA, Disp: 120 tablet, Rfl: 4 .  insulin NPH-regular Human (NOVOLIN 70/30 RELION) (70-30) 100 UNIT/ML injection, Inject 32 units subcutaneously before breakfast and inject 30 units subcutaneously before supper.  Inyecte 32 unidades subcutaneos antes de el desayuno y inyecte 30 unidades subcutaneos antes de la cena (Patient taking differently: Inject 35  units  subcutaneously before breakfast and inject 32 units subcutaneously before supper.  Inyecte 32 unidades subcutaneos antes de el desayuno y inyecte 30 unidades subcutaneos antes de la cena), Disp: 1 vial, Rfl: 4 .  lisinopril-hydrochlorothiazide (PRINZIDE,ZESTORETIC) 20-25 MG tablet, 1 po qd. Tome una tableta por boca diaria, Disp: 30 tablet, Rfl: 4 .  metFORMIN (GLUCOPHAGE) 1000 MG tablet, TAKE ONE TABLET BY MOUTH TWICE DAILY TOME  UNA  TALBETA  POR  BOCA  DOS  VECES  DIARIAS  CON  COMIDA, Disp: 60 tablet, Rfl: 3 .  metoprolol tartrate (LOPRESSOR) 25 MG tablet, 1/2 tab po bid.  Tome media tableta por boca dos veces diarias, Disp: 30 tablet, Rfl: 3 .  naproxen (NAPROSYN) 500 MG tablet, 1 po q 12 hours prn pain.  Tome una tableta por boca cada 12 horas cuando sea necesario para el dolor, Disp: 60 tablet, Rfl: 2 .  Sennosides (TGT NATURAL LAXATIVE PILLS) 25 MG TABS, Take 8.6 mg by mouth 2 (two) times daily. , Disp: , Rfl:  .  simvastatin (ZOCOR) 20 MG tablet, TAKE ONE TABLET BY MOUTH ONCE DAILY AT BEDTIME. TOME UNA TABLETA POR LA BOCA AL DORMIR., Disp: 30 tablet, Rfl: 5 .  nitroGLYCERIN (NITROSTAT) 0.4 MG SL tablet, Place 1 tablet (0.4 mg total) under the tongue every 5 (five) minutes as needed for chest pain. (Patient not taking:  Reported on 06/23/2016), Disp: 25 tablet, Rfl: 3 .  ranitidine (ZANTAC) 300 MG tablet, 1 po qd.  Tome una tableta por boca diaria (Patient not taking: Reported on 06/23/2016), Disp: 30 tablet, Rfl: 4   Review of Systems  Constitutional: Positive for fatigue. Negative for appetite change, chills, diaphoresis, fever and unexpected weight change.  HENT: Positive for dental problem and mouth sores. Negative for congestion, drooling, ear pain, facial swelling, hearing loss, sneezing, sore throat, trouble swallowing and voice change.   Eyes: Negative for pain, discharge, redness, itching and visual disturbance.  Respiratory: Negative for cough, choking, shortness of breath and wheezing.    Cardiovascular: Negative for chest pain, palpitations and leg swelling.  Gastrointestinal: Positive for abdominal pain. Negative for blood in stool, constipation, diarrhea and vomiting.  Endocrine: Negative for cold intolerance, heat intolerance and polydipsia.  Genitourinary: Negative for decreased urine volume, dysuria and hematuria.  Musculoskeletal: Negative for arthralgias, back pain and gait problem.  Skin: Negative for rash.  Allergic/Immunologic: Positive for environmental allergies.  Neurological: Negative for seizures, syncope, light-headedness and headaches.  Hematological: Negative for adenopathy.  Psychiatric/Behavioral: Negative for agitation, dysphoric mood and suicidal ideas. The patient is not nervous/anxious.     Per HPI unless specifically indicated above     Objective:    BP 130/76 (BP Location: Left Arm, Patient Position: Sitting, Cuff Size: Normal)   Pulse 70   Temp 97.5 F (36.4 C)   Ht 4' 11.25" (1.505 m)   Wt 152 lb 8 oz (69.2 kg)   LMP 03/05/2015   SpO2 93%   BMI 30.54 kg/m   Wt Readings from Last 3 Encounters:  06/23/16 152 lb 8 oz (69.2 kg)  06/11/16 154 lb (69.9 kg)  03/06/16 153 lb 6.4 oz (69.6 kg)    Physical Exam  Constitutional: She is oriented to person, place, and time. She appears well-developed and well-nourished.  HENT:  Head: Normocephalic and atraumatic.  Neck: Neck supple.  Cardiovascular: Normal rate and regular rhythm.   Pulmonary/Chest: Effort normal and breath sounds normal.  Abdominal: Soft. Bowel sounds are normal. She exhibits no mass. There is no hepatosplenomegaly. There is no tenderness.  Musculoskeletal: She exhibits no edema.  Lymphadenopathy:    She has no cervical adenopathy.  Neurological: She is alert and oriented to person, place, and time.  Skin: Skin is warm and dry.  Psychiatric: She has a normal mood and affect. Her behavior is normal.  Vitals reviewed.   Results for orders placed or performed in visit  on 06/11/16  HgB A1c  Result Value Ref Range   Hgb A1c MFr Bld 9.5 (H) <5.7 %   Mean Plasma Glucose 226 mg/dL  Comprehensive metabolic panel  Result Value Ref Range   Sodium 138 135 - 146 mmol/L   Potassium 4.0 3.5 - 5.3 mmol/L   Chloride 101 98 - 110 mmol/L   CO2 27 20 - 31 mmol/L   Glucose, Bld 230 (H) 65 - 99 mg/dL   BUN 12 7 - 25 mg/dL   Creat 0.52 0.50 - 1.05 mg/dL   Total Bilirubin 0.4 0.2 - 1.2 mg/dL   Alkaline Phosphatase 92 33 - 130 U/L   AST 15 10 - 35 U/L   ALT 16 6 - 29 U/L   Total Protein 7.0 6.1 - 8.1 g/dL   Albumin 4.1 3.6 - 5.1 g/dL   Calcium 9.2 8.6 - 10.4 mg/dL  Lipid panel  Result Value Ref Range   Cholesterol 115 <200 mg/dL  Triglycerides 119 <150 mg/dL   HDL 29 (L) >50 mg/dL   Total CHOL/HDL Ratio 4.0 <5.0 Ratio   VLDL 24 <30 mg/dL   LDL Cholesterol 62 <100 mg/dL      Assessment & Plan:    Encounter Diagnoses  Name Primary?  Marland Kitchen Uncontrolled type 2 diabetes mellitus with complication, with long-term current use of insulin ([place]) Yes  . LLQ abdominal pain   . Grief     -reviewed labs with pt -Gave [facility] card for counseling -increase insluin to 38 and 35 units.  Pt reminded to call office for fbs < 70 or >300 -F/u with bs log 1 month.  RTO sooner prn

## 2016-06-25 ENCOUNTER — Ambulatory Visit (HOSPITAL_COMMUNITY): Payer: Self-pay

## 2016-06-25 ENCOUNTER — Ambulatory Visit (HOSPITAL_COMMUNITY)
Admission: RE | Admit: 2016-06-25 | Discharge: 2016-06-25 | Disposition: A | Discharge status: Home / Self Care | Payer: Self-pay | Source: Ambulatory Visit | Attending: Physician Assistant | Admitting: Physician Assistant

## 2016-06-25 DIAGNOSIS — R1032 Left lower quadrant pain: Secondary | ICD-10-CM

## 2016-06-25 DIAGNOSIS — D259 Leiomyoma of uterus, unspecified: Secondary | ICD-10-CM | POA: Insufficient documentation

## 2016-06-25 DIAGNOSIS — N83201 Unspecified ovarian cyst, right side: Secondary | ICD-10-CM | POA: Insufficient documentation

## 2016-07-21 ENCOUNTER — Encounter: Payer: Self-pay | Admitting: Physician Assistant

## 2016-07-21 ENCOUNTER — Ambulatory Visit: Payer: Self-pay | Admitting: Physician Assistant

## 2016-07-21 VITALS — BP 140/76 | HR 75 | Temp 97.7°F | Ht 59.25 in | Wt 154.0 lb

## 2016-07-21 DIAGNOSIS — E118 Type 2 diabetes mellitus with unspecified complications: Principal | ICD-10-CM

## 2016-07-21 DIAGNOSIS — E1165 Type 2 diabetes mellitus with hyperglycemia: Secondary | ICD-10-CM

## 2016-07-21 DIAGNOSIS — M62838 Other muscle spasm: Secondary | ICD-10-CM

## 2016-07-21 DIAGNOSIS — I10 Essential (primary) hypertension: Secondary | ICD-10-CM

## 2016-07-21 DIAGNOSIS — IMO0002 Reserved for concepts with insufficient information to code with codable children: Secondary | ICD-10-CM

## 2016-07-21 DIAGNOSIS — Z794 Long term (current) use of insulin: Principal | ICD-10-CM

## 2016-07-21 NOTE — Progress Notes (Signed)
BP 140/76 (BP Location: Left Arm, Patient Position: Sitting, Cuff Size: Normal)   Pulse 75   Temp 97.7 F (36.5 C)   Ht 4' 11.25" (1.505 m)   Wt 154 lb (69.9 kg)   LMP 03/05/2015   SpO2 97%   BMI 30.84 kg/m    Subjective:    Patient ID: Yesenia Hoffman, female    DOB: Nov 17, 1965, 51 y.o.   MRN: [id number]  HPI: Yesenia Hoffman is a 51 y.o. female presenting on 07/21/2016 for Diabetes   HPI   Pt says she is taking care of herself.  She says her mood is okay.  She is not getting counseling, she feels like she doesn't need counseling.   Pt hasn't taken her meds yet today  Reviewed bs log- most in 160-189. She says she is only a little bit watching what she eats.    Pt c/o L shoulder pain which starts up after aout 1 1/2 hour of being at work.  She says it is every day.  Going for about 2 wk.  No injury  Relevant past medical, surgical, family and social history reviewed and updated as indicated. Interim medical history since our last visit reviewed. Allergies and medications reviewed and updated.   Current Outpatient Prescriptions:  .  amLODipine (NORVASC) 10 MG tablet, TAKE ONE TABLET BY MOUTH ONCE DAILY *TOME UNA TABLETA POR BOCA DIARIA*, Disp: 30 tablet, Rfl: 4 .  aspirin 81 MG tablet, Take 1 tablet (81 mg total) by mouth daily., Disp: , Rfl:  .  fexofenadine (ALLEGRA) 180 MG tablet, Take 180 mg by mouth daily., Disp: , Rfl:  .  fish oil-omega-3 fatty acids 1000 MG capsule, Take 2 capsules by mouth 2 (two) times daily. , Disp: , Rfl:  .  glipiZIDE (GLUCOTROL) 10 MG tablet, TAKE TWO TABLETS BY MOUTH TWICE DAILY BEFORE A MEAL. TOME DOS TABLETAS POR BOCA DOS VECES DIARIAS CON COMIDA., Disp: 120 tablet, Rfl: 4 .  insulin NPH-regular Human (NOVOLIN 70/30 RELION) (70-30) 100 UNIT/ML injection, Inject 32 units subcutaneously before breakfast and inject 30 units subcutaneously before supper.  Inyecte 32 unidades subcutaneos antes de el desayuno y inyecte 30 unidades subcutaneos antes  de la cena (Patient taking differently: Inject 38  units subcutaneously before breakfast and inject 35 units subcutaneously before supper.  Inyecte 38 unidades subcutaneos antes de el desayuno y inyecte 35 unidades subcutaneos antes de la cena), Disp: 1 vial, Rfl: 4 .  lisinopril-hydrochlorothiazide (PRINZIDE,ZESTORETIC) 20-25 MG tablet, 1 po qd. Tome una tableta por boca diaria, Disp: 30 tablet, Rfl: 4 .  metFORMIN (GLUCOPHAGE) 1000 MG tablet, TAKE ONE TABLET BY MOUTH TWICE DAILY TOME  UNA  TALBETA  POR  BOCA  DOS  VECES  DIARIAS  CON  COMIDA, Disp: 60 tablet, Rfl: 3 .  metoprolol tartrate (LOPRESSOR) 25 MG tablet, 1/2 tab po bid.  Tome media tableta por boca dos veces diarias, Disp: 30 tablet, Rfl: 3 .  Multiple Minerals-Vitamins (CALCIUM & VIT D3 BONE HEALTH PO), Take 1 tablet by mouth 2 (two) times daily., Disp: , Rfl:  .  naproxen (NAPROSYN) 500 MG tablet, 1 po q 12 hours prn pain.  Tome una tableta por boca cada 12 horas cuando sea necesario para el dolor, Disp: 60 tablet, Rfl: 2 .  ranitidine (ZANTAC) 300 MG tablet, 1 po qd.  Tome una tableta por boca diaria, Disp: 30 tablet, Rfl: 4 .  Sennosides (TGT NATURAL LAXATIVE PILLS) 25 MG TABS, Take 8.6 mg by  mouth 2 (two) times daily. , Disp: , Rfl:  .  simvastatin (ZOCOR) 20 MG tablet, TAKE ONE TABLET BY MOUTH ONCE DAILY AT BEDTIME. TOME UNA TABLETA POR LA BOCA AL DORMIR., Disp: 30 tablet, Rfl: 5 .  nitroGLYCERIN (NITROSTAT) 0.4 MG SL tablet, Place 1 tablet (0.4 mg total) under the tongue every 5 (five) minutes as needed for chest pain. (Patient not taking: Reported on 06/23/2016), Disp: 25 tablet, Rfl: 3   Review of Systems  Constitutional: Negative for appetite change, chills, diaphoresis, fatigue, fever and unexpected weight change.  HENT: Positive for congestion, dental problem and sneezing. Negative for drooling, ear pain, facial swelling, hearing loss, mouth sores, sore throat, trouble swallowing and voice change.   Eyes: Negative for pain,  discharge, redness, itching and visual disturbance.  Respiratory: Positive for cough. Negative for choking, shortness of breath and wheezing.   Cardiovascular: Negative for chest pain, palpitations and leg swelling.  Gastrointestinal: Negative for abdominal pain, blood in stool, constipation, diarrhea and vomiting.  Endocrine: Negative for cold intolerance, heat intolerance and polydipsia.  Genitourinary: Negative for decreased urine volume, dysuria and hematuria.  Musculoskeletal: Positive for back pain. Negative for arthralgias and gait problem.  Skin: Negative for rash.  Allergic/Immunologic: Positive for environmental allergies.  Neurological: Negative for seizures, syncope, light-headedness and headaches.  Hematological: Negative for adenopathy.  Psychiatric/Behavioral: Negative for agitation, dysphoric mood and suicidal ideas. The patient is not nervous/anxious.     Per HPI unless specifically indicated above     Objective:    BP 140/76 (BP Location: Left Arm, Patient Position: Sitting, Cuff Size: Normal)   Pulse 75   Temp 97.7 F (36.5 C)   Ht 4' 11.25" (1.505 m)   Wt 154 lb (69.9 kg)   LMP 03/05/2015   SpO2 97%   BMI 30.84 kg/m   Wt Readings from Last 3 Encounters:  07/21/16 154 lb (69.9 kg)  06/23/16 152 lb 8 oz (69.2 kg)  06/11/16 154 lb (69.9 kg)    Physical Exam  Constitutional: She is oriented to person, place, and time. She appears well-developed and well-nourished.  HENT:  Head: Normocephalic and atraumatic.  Neck: Neck supple.  Cardiovascular: Normal rate and regular rhythm.   Pulmonary/Chest: Effort normal and breath sounds normal.  Abdominal: Soft. Bowel sounds are normal. She exhibits no mass. There is no hepatosplenomegaly. There is no tenderness.  Musculoskeletal: She exhibits no edema.       Right shoulder: Normal.       Cervical back: She exhibits tenderness and spasm. She exhibits normal range of motion and no bony tenderness.        Back:  Lymphadenopathy:    She has no cervical adenopathy.  Neurological: She is alert and oriented to person, place, and time.  Skin: Skin is warm and dry.  Psychiatric: She has a normal mood and affect. Her behavior is normal.  Vitals reviewed.       Assessment & Plan:   Encounter Diagnoses  Name Primary?  Marland Kitchen Uncontrolled type 2 diabetes mellitus with complication, with long-term current use of insulin ([place]) Yes  . Essential hypertension   . Spasm of muscle     -Increase insulin to 42units am and 40 units evening -order Dm eye exam -pt counseled to use Naproxen and heat for L shoulder muscle  -f/u 1 mo with bs log. RTO sooner prn

## 2016-08-18 ENCOUNTER — Encounter: Payer: Self-pay | Admitting: Physician Assistant

## 2016-08-18 ENCOUNTER — Ambulatory Visit: Payer: Self-pay | Admitting: Physician Assistant

## 2016-08-18 VITALS — BP 118/78 | HR 72 | Temp 97.5°F | Ht 59.25 in | Wt 150.0 lb

## 2016-08-18 DIAGNOSIS — R059 Cough, unspecified: Secondary | ICD-10-CM

## 2016-08-18 DIAGNOSIS — R0602 Shortness of breath: Secondary | ICD-10-CM

## 2016-08-18 DIAGNOSIS — R05 Cough: Secondary | ICD-10-CM

## 2016-08-18 DIAGNOSIS — J069 Acute upper respiratory infection, unspecified: Secondary | ICD-10-CM

## 2016-08-18 MED ORDER — BENZONATATE 100 MG PO CAPS: ORAL_CAPSULE | ORAL | 3 refills | Status: DC

## 2016-08-18 MED ORDER — ALBUTEROL SULFATE (2.5 MG/3ML) 0.083% IN NEBU: INHALATION_SOLUTION | RESPIRATORY_TRACT | 1 refills | Status: DC

## 2016-08-18 MED ORDER — ALBUTEROL SULFATE (2.5 MG/3ML) 0.083% IN NEBU
2.5000 mg | INHALATION_SOLUTION | Freq: Once | RESPIRATORY_TRACT | Status: AC
  Administered 2016-08-18: 2.5 mg via RESPIRATORY_TRACT

## 2016-08-18 NOTE — Patient Instructions (Signed)
Infeccin del tracto respiratorio superior, adultos (Upper Respiratory Infection, Adult) La mayora de las infecciones del tracto respiratorio superior son infecciones virales de las vas que llevan el aire a los pulmones. Un infeccin del tracto respiratorio superior afecta la nariz, la garganta y las vas respiratorias superiores. El tipo ms frecuente de infeccin del tracto respiratorio superior es la nasofaringitis, que habitualmente se conoce como "resfro comn". Las infecciones del tracto respiratorio superior siguen su curso y por lo general se curan solas. En la [employer], la infeccin del tracto respiratorio superior no requiere atencin [place], Armed forces training and education officer a veces, despus de una infeccin viral, puede surgir una infeccin bacteriana en las vas respiratorias superiores. Esto se conoce como infeccin secundaria. Las infecciones sinusales y en el odo medio son tipos frecuentes de infecciones secundarias en el tracto respiratorio superior. La neumona bacteriana tambin puede complicar un cuadro de infeccin del tracto respiratorio superior. Este tipo de infeccin puede empeorar el asma y la enfermedad pulmonar obstructiva crnica (EPOC). En algunos casos, estas complicaciones pueden requerir atencin mdica de emergencia y poner en peligro la vida. CAUSAS Casi todas las infecciones del tracto respiratorio superior se deben a los virus. Un virus es un tipo de microbio que puede contagiarse de Ardelia Mems persona a Theatre manager. FACTORES DE RIESGO Puede estar en riesgo de sufrir una infeccin del tracto respiratorio superior si:  Fuma.  Tiene una enfermedad pulmonar o cardaca crnica.  Tiene debilitado el sistema de defensa (inmunitario) del cuerpo.  Es muy joven o de edad muy [place].  Tiene asma o alergias nasales.  Trabaja en reas donde hay mucha gente o poca ventilacin.  Governor Rooks en una escuela o en un centro de atencin mdica. SIGNOS Y SNTOMAS Habitualmente, los sntomas aparecen de  2a 3das despus de entrar en contacto con el virus del resfro. La mayora de las infecciones virales en el tracto respiratorio superior duran de 7a 10das. Sin embargo, las infecciones virales en el tracto respiratorio superior a causa del virus de la gripe pueden durar de 14a 18das y, habitualmente, son ms graves. Entre los sntomas se pueden incluir los siguientes:  Secrecin o congestin nasal.  Estornudos.  Tos.  Dolor de Investment banker, operational.  Dolor de [place].  Fatiga.  Cristy Hilts.  Prdida del apetito.  Dolor en la frente, detrs de los ojos y por encima de los pmulos (dolor sinusal).  Dolores musculares. DIAGNSTICO El mdico puede diagnosticar una infeccin del tracto respiratorio superior mediante los siguientes estudios:  Examen fsico.  Pruebas para verificar si los sntomas no se deben a otra afeccin, por ejemplo:  Faringitis estreptoccica.  Sinusitis.  Neumona.  Asma. TRATAMIENTO Esta infeccin desaparece sola, con el tiempo. No puede curarse con medicamentos, pero a menudo se prescriben para aliviar los sntomas. Los medicamentos pueden ser tiles para lo siguiente:  Engineer, materials fiebre.  Reducir la tos.  Aliviar la congestin nasal. INSTRUCCIONES PARA EL CUIDADO EN EL HOGAR  Tome los medicamentos solamente como se lo haya indicado el mdico.  A fin de Best boy de garganta, haga grgaras con solucin salina templada o consuma caramelos para la tos, como se lo haya indicado el mdico.  Use un humidificador de vapor clido o inhale el vapor de la ducha para aumentar la humedad del aire. Esto facilitar la respiracin.  Beba suficiente lquido para Consulting civil engineer orina clara o de color amarillo plido.  Consuma sopas y otros caldos transparentes, y [employer].  Descanse todo lo que sea necesario.  Regrese al [place]  la temperatura se le haya normalizado o cuando el mdico lo autorice. Es posible que deba quedarse en su casa durante un  tiempo prolongado, para no infectar a los dems. [place] usar un barbijo y lavarse las manos con cuidado para Mining engineer propagacin del virus.  Aumente el uso del inhalador si tiene asma.  No consuma ningn producto que contenga tabaco, lo que incluye cigarrillos, tabaco de Higher education careers adviser o Psychologist, sport and exercise. Si necesita ayuda para dejar de fumar, consulte al [employer]. PREVENCIN La mejor manera de protegerse de un resfro es mantener una higiene [place].  Evite el contacto oral o fsico con personas que tengan sntomas de resfro.  En caso de contacto, lvese las manos con frecuencia. No hay pruebas claras de que la vitaminaC, la vitaminaE, la equincea o el ejercicio reduzcan la probabilidad de Museum/gallery curator un resfro. Sin embargo, siempre se recomienda Scientific laboratory technician, hacer ejercicio y Ecologist. SOLICITE ATENCIN MDICA SI:  Su estado empeora en lugar de mejorar.  Los medicamentos no Animator.  Tiene escalofros.  La sensacin de falta de aire empeora.  Tiene mucosidad marrn o roja.  Tiene secrecin nasal amarilla o marrn.  Le duele la cara, especialmente al inclinarse hacia adelante.  Tiene fiebre.  Tiene los ganglios del cuello hinchados.  Siente dolor al tragar.  Tiene zonas blancas en la parte de atrs de la garganta. SOLICITE ATENCIN MDICA DE INMEDIATO SI:  Tiene sntomas intensos o persistentes de:  Dolor de [place].  Dolor de odos.  Dolor sinusal.  Dolor en el pecho.  Tiene enfermedad pulmonar crnica y cualquiera de estos sntomas:  Sibilancias.  Tos prolongada.  Tos con sangre.  Cambio en la mucosidad habitual.  Presenta rigidez en el cuello.  Tiene cambios en:  La visin.  La audicin.  El pensamiento.  El [place] de nimo. ASEGRESE DE QUE:  Comprende estas instrucciones.  Controlar su afeccin.  Recibir ayuda de inmediato si no mejora o si empeora. Esta informacin no tiene Hydrologist el consejo del mdico. Asegrese de hacerle al mdico cualquier pregunta que tenga. Document Released: 01/22/2005 Document Revised: 08/29/2014 Document Reviewed: 07/20/2013 Elsevier Interactive Patient Education  2017 [employer].

## 2016-08-18 NOTE — Progress Notes (Signed)
BP 118/78 (BP Location: Left Arm, Patient Position: Sitting, Cuff Size: Normal)   Pulse 72   Temp 97.5 F (36.4 C)   Ht 4' 11.25" (1.505 m)   Wt 150 lb (68 kg)   LMP 03/05/2015   SpO2 98%   BMI 30.04 kg/m    Subjective:    Patient ID: Yesenia Hoffman, female    DOB: 08-06-1965, 51 y.o.   MRN: [id number]  HPI: Yesenia Hoffman is a 51 y.o. female presenting on 08/18/2016 for Cough (CP, nasal congestion, phelgm, HA, fever (subjective), vomit, wheezing. sx began Friday. pt has taken theraflu, ambroxol, ampicillin, and metamizole)   HPI   Chief Complaint  Patient presents with  . Cough    CP, nasal congestion, phelgm, HA, fever (subjective), vomit, wheezing. sx began Friday. pt has taken theraflu, ambroxol, ampicillin, and metamizole     Post tussive emesis.  "Feels like phlegm is stuck".  No EA.  Only a little ST.   States blood sugars not up too much.   Relevant past medical, surgical, family and social history reviewed and updated as indicated. Interim medical history since our last visit reviewed. Allergies and medications reviewed and updated.   Current Outpatient Prescriptions:  .  amLODipine (NORVASC) 10 MG tablet, TAKE ONE TABLET BY MOUTH ONCE DAILY *TOME UNA TABLETA POR BOCA DIARIA*, Disp: 30 tablet, Rfl: 4 .  aspirin 81 MG tablet, Take 1 tablet (81 mg total) by mouth daily., Disp: , Rfl:  .  fexofenadine (ALLEGRA) 180 MG tablet, Take 180 mg by mouth daily., Disp: , Rfl:  .  fish oil-omega-3 fatty acids 1000 MG capsule, Take 2 capsules by mouth 2 (two) times daily. , Disp: , Rfl:  .  glipiZIDE (GLUCOTROL) 10 MG tablet, TAKE TWO TABLETS BY MOUTH TWICE DAILY BEFORE A MEAL. TOME DOS TABLETAS POR BOCA DOS VECES DIARIAS CON COMIDA., Disp: 120 tablet, Rfl: 4 .  insulin NPH-regular Human (NOVOLIN 70/30 RELION) (70-30) 100 UNIT/ML injection, Inject 32 units subcutaneously before breakfast and inject 30 units subcutaneously before supper.  Inyecte 32 unidades subcutaneos antes de  el desayuno y inyecte 30 unidades subcutaneos antes de la cena (Patient taking differently: Inject 40 units subcutaneously before breakfast and inject 38 units subcutaneously before supper.  Inyecte 40 unidades subcutaneos antes de el desayuno y inyecte 38 unidades subcutaneos antes de la cena), Disp: 1 vial, Rfl: 4 .  lisinopril-hydrochlorothiazide (PRINZIDE,ZESTORETIC) 20-25 MG tablet, 1 po qd. Tome una tableta por boca diaria, Disp: 30 tablet, Rfl: 4 .  metFORMIN (GLUCOPHAGE) 1000 MG tablet, TAKE ONE TABLET BY MOUTH TWICE DAILY TOME  UNA  TALBETA  POR  BOCA  DOS  VECES  DIARIAS  CON  COMIDA, Disp: 60 tablet, Rfl: 3 .  metoprolol tartrate (LOPRESSOR) 25 MG tablet, 1/2 tab po bid.  Tome media tableta por boca dos veces diarias, Disp: 30 tablet, Rfl: 3 .  Multiple Minerals-Vitamins (CALCIUM & VIT D3 BONE HEALTH PO), Take 1 tablet by mouth 2 (two) times daily., Disp: , Rfl:  .  naproxen (NAPROSYN) 500 MG tablet, 1 po q 12 hours prn pain.  Tome una tableta por boca cada 12 horas cuando sea necesario para el dolor, Disp: 60 tablet, Rfl: 2 .  ranitidine (ZANTAC) 300 MG tablet, 1 po qd.  Tome una tableta por boca diaria, Disp: 30 tablet, Rfl: 4 .  Sennosides (TGT NATURAL LAXATIVE PILLS) 25 MG TABS, Take 16.12 mg by mouth 2 (two) times daily. , Disp: , Rfl:  .  simvastatin (ZOCOR) 20 MG tablet, TAKE ONE TABLET BY MOUTH ONCE DAILY AT BEDTIME. TOME UNA TABLETA POR LA BOCA AL DORMIR., Disp: 30 tablet, Rfl: 5 .  nitroGLYCERIN (NITROSTAT) 0.4 MG SL tablet, Place 1 tablet (0.4 mg total) under the tongue every 5 (five) minutes as needed for chest pain. (Patient not taking: Reported on 08/18/2016), Disp: 25 tablet, Rfl: 3   Review of Systems  Constitutional: Positive for chills, diaphoresis, fatigue and fever. Negative for appetite change and unexpected weight change.  HENT: Positive for congestion, dental problem, mouth sores and sneezing. Negative for drooling, ear pain, facial swelling, hearing loss, sore throat,  trouble swallowing and voice change.   Eyes: Negative for pain, discharge, redness, itching and visual disturbance.  Respiratory: Positive for cough, shortness of breath and wheezing. Negative for choking.   Cardiovascular: Positive for chest pain. Negative for palpitations and leg swelling.  Gastrointestinal: Positive for vomiting. Negative for abdominal pain, blood in stool, constipation and diarrhea.  Endocrine: Negative for cold intolerance, heat intolerance and polydipsia.  Genitourinary: Negative for decreased urine volume, dysuria and hematuria.  Musculoskeletal: Positive for back pain. Negative for arthralgias and gait problem.  Skin: Negative for rash.  Allergic/Immunologic: Positive for environmental allergies.  Neurological: Positive for headaches. Negative for seizures, syncope and light-headedness.  Hematological: Negative for adenopathy.  Psychiatric/Behavioral: Negative for agitation, dysphoric mood and suicidal ideas. The patient is not nervous/anxious.     Per HPI unless specifically indicated above     Objective:    BP 118/78 (BP Location: Left Arm, Patient Position: Sitting, Cuff Size: Normal)   Pulse 72   Temp 97.5 F (36.4 C)   Ht 4' 11.25" (1.505 m)   Wt 150 lb (68 kg)   LMP 03/05/2015   SpO2 98%   BMI 30.04 kg/m   Wt Readings from Last 3 Encounters:  08/18/16 150 lb (68 kg)  07/21/16 154 lb (69.9 kg)  06/23/16 152 lb 8 oz (69.2 kg)    Physical Exam  Constitutional: She is oriented to person, place, and time. She appears well-developed and well-nourished.  HENT:  Head: Normocephalic and atraumatic.  Right Ear: Hearing, tympanic membrane, external ear and ear canal normal.  Left Ear: Hearing, tympanic membrane, external ear and ear canal normal.  Nose: Rhinorrhea present.  Mouth/Throat: Uvula is midline and oropharynx is clear and moist. No oropharyngeal exudate.  Neck: Neck supple.  Cardiovascular: Normal rate and regular rhythm.   Pulmonary/Chest:  Effort normal and breath sounds normal. No tachypnea. No respiratory distress. She has no wheezes. She has no rhonchi.  Breath sounds tight.  Much increased air flow after nebulizer treatment.   Abdominal: Soft. Bowel sounds are normal. She exhibits no mass. There is no hepatosplenomegaly. There is no tenderness.  Musculoskeletal: She exhibits no edema.  Lymphadenopathy:    She has no cervical adenopathy.  Neurological: She is alert and oriented to person, place, and time.  Skin: Skin is warm and dry.  Psychiatric: She has a normal mood and affect. Her behavior is normal.  Vitals reviewed.       Assessment & Plan:   Encounter Diagnoses  Name Primary?  . Acute upper respiratory infection Yes  . SOB (shortness of breath)   . Cough      -gave Nebulizer treatment in office -rx abuterol for nebulizer and tessalon. Pt encouraged to use otc mucinex.   -encouraged rest, fluids -pt given note to be out of work -pt has routine follow up next Monday.  She  is to RTO sooner prn worsening or new symptoms

## 2016-08-21 ENCOUNTER — Ambulatory Visit: Payer: Self-pay | Admitting: Physician Assistant

## 2016-08-21 ENCOUNTER — Encounter: Payer: Self-pay | Admitting: Physician Assistant

## 2016-08-21 VITALS — BP 144/82 | HR 91 | Temp 97.5°F | Resp 16 | Ht 59.25 in | Wt 152.0 lb

## 2016-08-21 DIAGNOSIS — J209 Acute bronchitis, unspecified: Secondary | ICD-10-CM

## 2016-08-21 DIAGNOSIS — E1165 Type 2 diabetes mellitus with hyperglycemia: Secondary | ICD-10-CM

## 2016-08-21 DIAGNOSIS — E118 Type 2 diabetes mellitus with unspecified complications: Secondary | ICD-10-CM

## 2016-08-21 DIAGNOSIS — IMO0002 Reserved for concepts with insufficient information to code with codable children: Secondary | ICD-10-CM

## 2016-08-21 DIAGNOSIS — J069 Acute upper respiratory infection, unspecified: Secondary | ICD-10-CM

## 2016-08-21 DIAGNOSIS — Z794 Long term (current) use of insulin: Secondary | ICD-10-CM

## 2016-08-21 LAB — GLUCOSE, POCT (MANUAL RESULT ENTRY): POC Glucose: 203 mg/dl — AB (ref 70–99)

## 2016-08-21 MED ORDER — PROMETHAZINE-DM 6.25-15 MG/5ML PO SYRP: ORAL_SOLUTION | ORAL | 0 refills | Status: DC

## 2016-08-21 MED ORDER — LEVOFLOXACIN 500 MG PO TABS: ORAL_TABLET | ORAL | 0 refills | Status: AC

## 2016-08-21 NOTE — Progress Notes (Signed)
BP (!) 144/82 (BP Location: Left Arm, Patient Position: Sitting, Cuff Size: Normal)   Pulse 91   Temp 97.5 F (36.4 C)   Ht 4' 11.25" (1.505 m)   Wt 152 lb (68.9 kg)   LMP 03/05/2015   SpO2 98%   BMI 30.44 kg/m    Subjective:    Patient ID: Yesenia Hoffman, female    DOB: [birth year], 51 y.o.   MRN: [id number]  HPI: Yesenia Hoffman is a 51 y.o. female presenting on 08/21/2016 for Cough (difficuly breathing, wheezing. )   HPI Pt still sick from when here on 08/18/16.  She was given albuterol to use in nebulizer and tessalon  She says the nebs at home help.  Denies fever.   States worst thing is sob, wheezing and feels weak.   Pt states fbs 250 this a.m.  Random in office now 203  Relevant past medical, surgical, family and social history reviewed and updated as indicated. Interim medical history since our last visit reviewed. Allergies and medications reviewed and updated.   Current Outpatient Prescriptions:  .  albuterol (PROVENTIL) (2.5 MG/3ML) 0.083% nebulizer solution, Take 62ml by nebulizer every 4 to 6 hours as needed.  Tome 3 ml por nebulizacion cada 4 a 6 horas cuando sea necesario, Disp: 150 mL, Rfl: 1 .  amLODipine (NORVASC) 10 MG tablet, TAKE ONE TABLET BY MOUTH ONCE DAILY *TOME UNA TABLETA POR BOCA DIARIA*, Disp: 30 tablet, Rfl: 4 .  aspirin 81 MG tablet, Take 1 tablet (81 mg total) by mouth daily., Disp: , Rfl:  .  benzonatate (TESSALON PERLES) 100 MG capsule, 1-2 po q 8 hour prn cough.  1-2 tabletas por boca cada 8 horas cuando sea necesario para la toz, Disp: 20 capsule, Rfl: 3 .  fexofenadine (ALLEGRA) 180 MG tablet, Take 180 mg by mouth daily., Disp: , Rfl:  .  fish oil-omega-3 fatty acids 1000 MG capsule, Take 2 capsules by mouth 2 (two) times daily. , Disp: , Rfl:  .  glipiZIDE (GLUCOTROL) 10 MG tablet, TAKE TWO TABLETS BY MOUTH TWICE DAILY BEFORE A MEAL. TOME DOS TABLETAS POR BOCA DOS VECES DIARIAS CON COMIDA., Disp: 120 tablet, Rfl: 4 .  insulin NPH-regular  Human (NOVOLIN 70/30 RELION) (70-30) 100 UNIT/ML injection, Inject 32 units subcutaneously before breakfast and inject 30 units subcutaneously before supper.  Inyecte 32 unidades subcutaneos antes de el desayuno y inyecte 30 unidades subcutaneos antes de la cena (Patient taking differently: Inject 42 units subcutaneously before breakfast and inject 40 units subcutaneously before supper.  Inyecte 42 unidades subcutaneos antes de el desayuno y inyecte 40 unidades subcutaneos antes de la cena), Disp: 1 vial, Rfl: 4 .  lisinopril-hydrochlorothiazide (PRINZIDE,ZESTORETIC) 20-25 MG tablet, 1 po qd. Tome una tableta por boca diaria, Disp: 30 tablet, Rfl: 4 .  metFORMIN (GLUCOPHAGE) 1000 MG tablet, TAKE ONE TABLET BY MOUTH TWICE DAILY TOME  UNA  TALBETA  POR  BOCA  DOS  VECES  DIARIAS  CON  COMIDA, Disp: 60 tablet, Rfl: 3 .  metoprolol tartrate (LOPRESSOR) 25 MG tablet, 1/2 tab po bid.  Tome media tableta por boca dos veces diarias, Disp: 30 tablet, Rfl: 3 .  Multiple Minerals-Vitamins (CALCIUM & VIT D3 BONE HEALTH PO), Take 1 tablet by mouth 2 (two) times daily., Disp: , Rfl:  .  naproxen (NAPROSYN) 500 MG tablet, 1 po q 12 hours prn pain.  Tome una tableta por boca cada 12 horas cuando sea necesario para el dolor, Disp: 60 tablet,  Rfl: 2 .  nitroGLYCERIN (NITROSTAT) 0.4 MG SL tablet, Place 1 tablet (0.4 mg total) under the tongue every 5 (five) minutes as needed for chest pain., Disp: 25 tablet, Rfl: 3 .  ranitidine (ZANTAC) 300 MG tablet, 1 po qd.  Tome una tableta por boca diaria, Disp: 30 tablet, Rfl: 4 .  Sennosides (TGT NATURAL LAXATIVE PILLS) 25 MG TABS, Take 16.12 mg by mouth 2 (two) times daily. , Disp: , Rfl:  .  simvastatin (ZOCOR) 20 MG tablet, TAKE ONE TABLET BY MOUTH ONCE DAILY AT BEDTIME. TOME UNA TABLETA POR LA BOCA AL DORMIR., Disp: 30 tablet, Rfl: 5   Review of Systems  Constitutional: Positive for appetite change, diaphoresis and fatigue. Negative for chills, fever and unexpected weight  change.  HENT: Positive for congestion, dental problem, ear pain, mouth sores and sneezing. Negative for drooling, facial swelling, hearing loss, sore throat, trouble swallowing and voice change.   Eyes: Negative for pain, discharge, redness, itching and visual disturbance.  Respiratory: Positive for cough, chest tightness and shortness of breath. Negative for choking and wheezing.   Cardiovascular: Negative for chest pain, palpitations and leg swelling.  Gastrointestinal: Positive for constipation. Negative for abdominal pain, blood in stool, diarrhea and vomiting.  Endocrine: Negative for cold intolerance, heat intolerance and polydipsia.  Genitourinary: Negative for decreased urine volume, dysuria and hematuria.  Musculoskeletal: Negative for arthralgias, back pain and gait problem.  Skin: Negative for rash.  Allergic/Immunologic: Positive for environmental allergies.  Neurological: Positive for headaches. Negative for seizures, syncope and light-headedness.  Hematological: Negative for adenopathy.  Psychiatric/Behavioral: Negative for agitation, dysphoric mood and suicidal ideas. The patient is nervous/anxious.     Per HPI unless specifically indicated above     Objective:    BP (!) 144/82 (BP Location: Left Arm, Patient Position: Sitting, Cuff Size: Normal)   Pulse 91   Temp 97.5 F (36.4 C)   Ht 4' 11.25" (1.505 m)   Wt 152 lb (68.9 kg)   LMP 03/05/2015   SpO2 98%   BMI 30.44 kg/m   Wt Readings from Last 3 Encounters:  08/21/16 152 lb (68.9 kg)  08/18/16 150 lb (68 kg)  07/21/16 154 lb (69.9 kg)    Physical Exam  Constitutional: She is oriented to person, place, and time. She appears well-developed and well-nourished.  HENT:  Head: Normocephalic and atraumatic.  Right Ear: Hearing, tympanic membrane, external ear and ear canal normal.  Left Ear: Hearing, tympanic membrane, external ear and ear canal normal.  Nose: Rhinorrhea present.  Mouth/Throat: Uvula is midline  and oropharynx is clear and moist. No oropharyngeal exudate.  Neck: Neck supple.  Cardiovascular: Normal rate and regular rhythm.   Pulmonary/Chest: Effort normal. No tachypnea and no bradypnea. No respiratory distress. She has no decreased breath sounds. She has wheezes. She has no rhonchi. She has no rales.  Lymphadenopathy:    She has no cervical adenopathy.  Neurological: She is alert and oriented to person, place, and time.  Skin: Skin is warm and dry.  Psychiatric: She has a normal mood and affect. Her behavior is normal.  Vitals reviewed.       Assessment & Plan:    Encounter Diagnoses  Name Primary?  . Acute bronchitis, unspecified organism Yes  . Acute upper respiratory infection   . Uncontrolled type 2 diabetes mellitus with complication, with long-term current use of insulin ([place])     -pt to continue nebulizer treatments at home -rx levaquin and promethazine dm -rest. Fluids. -f/u  Monday for routine appointment

## 2016-08-25 ENCOUNTER — Encounter: Payer: Self-pay | Admitting: Physician Assistant

## 2016-08-25 ENCOUNTER — Ambulatory Visit: Payer: Self-pay | Admitting: Physician Assistant

## 2016-08-25 VITALS — BP 140/82 | HR 78 | Temp 97.7°F | Ht 59.25 in | Wt 154.8 lb

## 2016-08-25 DIAGNOSIS — E785 Hyperlipidemia, unspecified: Secondary | ICD-10-CM

## 2016-08-25 DIAGNOSIS — F32A Depression, unspecified: Secondary | ICD-10-CM

## 2016-08-25 DIAGNOSIS — F4321 Adjustment disorder with depressed mood: Secondary | ICD-10-CM

## 2016-08-25 DIAGNOSIS — F329 Major depressive disorder, single episode, unspecified: Secondary | ICD-10-CM

## 2016-08-25 DIAGNOSIS — I2510 Atherosclerotic heart disease of native coronary artery without angina pectoris: Secondary | ICD-10-CM

## 2016-08-25 DIAGNOSIS — I10 Essential (primary) hypertension: Secondary | ICD-10-CM

## 2016-08-25 DIAGNOSIS — Z794 Long term (current) use of insulin: Principal | ICD-10-CM

## 2016-08-25 DIAGNOSIS — E118 Type 2 diabetes mellitus with unspecified complications: Principal | ICD-10-CM

## 2016-08-25 DIAGNOSIS — IMO0002 Reserved for concepts with insufficient information to code with codable children: Secondary | ICD-10-CM

## 2016-08-25 DIAGNOSIS — E1165 Type 2 diabetes mellitus with hyperglycemia: Secondary | ICD-10-CM

## 2016-08-25 DIAGNOSIS — J069 Acute upper respiratory infection, unspecified: Secondary | ICD-10-CM

## 2016-08-25 DIAGNOSIS — K219 Gastro-esophageal reflux disease without esophagitis: Secondary | ICD-10-CM

## 2016-08-25 MED ORDER — METOPROLOL TARTRATE 25 MG PO TABS: ORAL_TABLET | ORAL | 3 refills | Status: DC

## 2016-08-25 MED ORDER — NITROGLYCERIN 0.4 MG SL SUBL: SUBLINGUAL_TABLET | SUBLINGUAL | 3 refills | Status: DC

## 2016-08-25 MED ORDER — METFORMIN HCL 1000 MG PO TABS
ORAL_TABLET | ORAL | 2 refills | Status: DC
Start: 2016-08-25 — End: 2016-11-26

## 2016-08-25 MED ORDER — LISINOPRIL-HYDROCHLOROTHIAZIDE 20-25 MG PO TABS: ORAL_TABLET | ORAL | 1 refills | Status: DC

## 2016-08-25 NOTE — Progress Notes (Signed)
BP 140/82 (BP Location: Left Arm, Patient Position: Sitting, Cuff Size: Normal)   Pulse 78   Temp 97.7 F (36.5 C)   Ht 4' 11.25" (1.505 m)   Wt 154 lb 12 oz (70.2 kg)   LMP 03/05/2015   SpO2 98%   BMI 30.99 kg/m    Subjective:    Patient ID: Yesenia Hoffman, female    DOB: 1965/12/27, 51 y.o.   MRN: [id number]  HPI: Yesenia Hoffman is a 52 y.o. female presenting on 08/25/2016 for Diabetes   HPI   Pt states breating better.  Still with cough, mostly when she goes outside.  Pt mood not so well.    No SI or HI but having a diffficult time with her grief.  She is considering moving to [place] with her son   Reviewed bs log.  Relevant past medical, surgical, family and social history reviewed and updated as indicated. Interim medical history since our last visit reviewed. Allergies and medications reviewed and updated.   Current Outpatient Prescriptions:  .  albuterol (PROVENTIL) (2.5 MG/3ML) 0.083% nebulizer solution, Take 52ml by nebulizer every 4 to 6 hours as needed.  Tome 3 ml por nebulizacion cada 4 a 6 horas cuando sea necesario, Disp: 150 mL, Rfl: 1 .  amLODipine (NORVASC) 10 MG tablet, TAKE ONE TABLET BY MOUTH ONCE DAILY *TOME UNA TABLETA POR BOCA DIARIA*, Disp: 30 tablet, Rfl: 4 .  aspirin 81 MG tablet, Take 1 tablet (81 mg total) by mouth daily., Disp: , Rfl:  .  benzonatate (TESSALON PERLES) 100 MG capsule, 1-2 po q 8 hour prn cough.  1-2 tabletas por boca cada 8 horas cuando sea necesario para la toz, Disp: 20 capsule, Rfl: 3 .  fexofenadine (ALLEGRA) 180 MG tablet, Take 180 mg by mouth daily., Disp: , Rfl:  .  fish oil-omega-3 fatty acids 1000 MG capsule, Take 2 capsules by mouth 2 (two) times daily. , Disp: , Rfl:  .  glipiZIDE (GLUCOTROL) 10 MG tablet, TAKE TWO TABLETS BY MOUTH TWICE DAILY BEFORE A MEAL. TOME DOS TABLETAS POR BOCA DOS VECES DIARIAS CON COMIDA., Disp: 120 tablet, Rfl: 4 .  insulin NPH-regular Human (NOVOLIN 70/30 RELION) (70-30) 100 UNIT/ML  injection, Inject 32 units subcutaneously before breakfast and inject 30 units subcutaneously before supper.  Inyecte 32 unidades subcutaneos antes de el desayuno y inyecte 30 unidades subcutaneos antes de la cena (Patient taking differently: Inject 42 units subcutaneously before breakfast and inject 40 units subcutaneously before supper.  Inyecte 42 unidades subcutaneos antes de el desayuno y inyecte 40 unidades subcutaneos antes de la cena), Disp: 1 vial, Rfl: 4 .  levofloxacin (LEVAQUIN) 500 MG tablet, 1 po qd x 7 days.  Tome una tableta por boca diaria x 7 dias, Disp: 7 tablet, Rfl: 0 .  lisinopril-hydrochlorothiazide (PRINZIDE,ZESTORETIC) 20-25 MG tablet, 1 po qd. Tome una tableta por boca diaria, Disp: 30 tablet, Rfl: 4 .  metFORMIN (GLUCOPHAGE) 1000 MG tablet, TAKE ONE TABLET BY MOUTH TWICE DAILY TOME  UNA  TALBETA  POR  BOCA  DOS  VECES  DIARIAS  CON  COMIDA, Disp: 60 tablet, Rfl: 3 .  metoprolol tartrate (LOPRESSOR) 25 MG tablet, 1/2 tab po bid.  Tome media tableta por boca dos veces diarias, Disp: 30 tablet, Rfl: 3 .  Multiple Minerals-Vitamins (CALCIUM & VIT D3 BONE HEALTH PO), Take 1 tablet by mouth 2 (two) times daily., Disp: , Rfl:  .  naproxen (NAPROSYN) 500 MG tablet, 1 po q 12  hours prn pain.  Tome una tableta por boca cada 12 horas cuando sea necesario para el dolor, Disp: 60 tablet, Rfl: 2 .  promethazine-dextromethorphan (PROMETHAZINE-DM) 6.25-15 MG/5ML syrup, 1 tsp po q 6 hour prn cough.  Una cucharada por boca cada 6 horas cuando sea necesario para la toz, Disp: 118 mL, Rfl: 0 .  ranitidine (ZANTAC) 300 MG tablet, 1 po qd.  Tome una tableta por boca diaria, Disp: 30 tablet, Rfl: 4 .  Sennosides (TGT NATURAL LAXATIVE PILLS) 25 MG TABS, Take 16.12 mg by mouth 2 (two) times daily. , Disp: , Rfl:  .  simvastatin (ZOCOR) 20 MG tablet, TAKE ONE TABLET BY MOUTH ONCE DAILY AT BEDTIME. TOME UNA TABLETA POR LA BOCA AL DORMIR., Disp: 30 tablet, Rfl: 5 .  nitroGLYCERIN (NITROSTAT) 0.4 MG SL  tablet, Place 1 tablet (0.4 mg total) under the tongue every 5 (five) minutes as needed for chest pain. (Patient not taking: Reported on 08/25/2016), Disp: 25 tablet, Rfl: 3  Review of Systems  Constitutional: Positive for diaphoresis and fatigue. Negative for appetite change, chills, fever and unexpected weight change.  HENT: Positive for congestion and dental problem. Negative for drooling, ear pain, facial swelling, hearing loss, mouth sores, sneezing, sore throat, trouble swallowing and voice change.   Eyes: Negative for pain, discharge, redness, itching and visual disturbance.  Respiratory: Positive for cough and shortness of breath. Negative for choking and wheezing.   Cardiovascular: Negative for chest pain, palpitations and leg swelling.  Gastrointestinal: Positive for constipation. Negative for abdominal pain, blood in stool, diarrhea and vomiting.  Endocrine: Negative for cold intolerance, heat intolerance and polydipsia.  Genitourinary: Negative for decreased urine volume, dysuria and hematuria.  Musculoskeletal: Positive for arthralgias. Negative for back pain and gait problem.  Skin: Negative for rash.  Allergic/Immunologic: Positive for environmental allergies.  Neurological: Positive for headaches. Negative for seizures, syncope and light-headedness.  Hematological: Negative for adenopathy.  Psychiatric/Behavioral: Positive for dysphoric mood. Negative for agitation and suicidal ideas. The patient is nervous/anxious.     Per HPI unless specifically indicated above     Objective:    BP 140/82 (BP Location: Left Arm, Patient Position: Sitting, Cuff Size: Normal)   Pulse 78   Temp 97.7 F (36.5 C)   Ht 4' 11.25" (1.505 m)   Wt 154 lb 12 oz (70.2 kg)   LMP 03/05/2015   SpO2 98%   BMI 30.99 kg/m   Wt Readings from Last 3 Encounters:  08/25/16 154 lb 12 oz (70.2 kg)  08/21/16 152 lb (68.9 kg)  08/18/16 150 lb (68 kg)    Physical Exam  Constitutional: She is oriented to  person, place, and time. She appears well-developed and well-nourished.  HENT:  Head: Normocephalic and atraumatic.  Neck: Neck supple.  Cardiovascular: Normal rate and regular rhythm.   Pulmonary/Chest: Effort normal and breath sounds normal.  Abdominal: Soft. Bowel sounds are normal. She exhibits no mass. There is no hepatosplenomegaly. There is no tenderness.  Musculoskeletal: She exhibits no edema.  Lymphadenopathy:    She has no cervical adenopathy.  Neurological: She is alert and oriented to person, place, and time.  Skin: Skin is warm and dry.  Psychiatric: She has a normal mood and affect. Her behavior is normal.  Vitals reviewed.      Assessment & Plan:   Encounter Diagnoses  Name Primary?  Marland Kitchen Uncontrolled type 2 diabetes mellitus with complication, with long-term current use of insulin ([place]) Yes  . Essential hypertension   .  Grief   . Depression, unspecified depression type   . Acute upper respiratory infection   . Hyperlipidemia, unspecified hyperlipidemia type   . Coronary artery disease involving native coronary artery of native heart without angina pectoris   . Gastroesophageal reflux disease, esophagitis presence not specified     -Pt was Given cardinal number for couseling, help with grief. -no medication changes today -follow up one month with labs before appointment

## 2016-08-31 IMAGING — US US PELVIS COMPLETE
1 series · 13 of 25 positions shown · non-contrast
Comparison: Ultrasound 03/23/2012.

CLINICAL DATA: Uterine fibroid.

EXAM:
TRANSABDOMINAL AND TRANSVAGINAL ULTRASOUND OF PELVIS
TECHNIQUE: Both transabdominal and transvaginal ultrasound examinations of the
pelvis were performed. Transabdominal technique was performed for
global imaging of the pelvis including uterus, ovaries, adnexal
regions, and pelvic cul-de-sac. It was necessary to proceed with
endovaginal exam following the transabdominal exam to visualize the
uterus and ovaries..

[Series 1: us pelvis complete · 0.20mm/px · 13 of 143 slices shown]
[im 1/143]
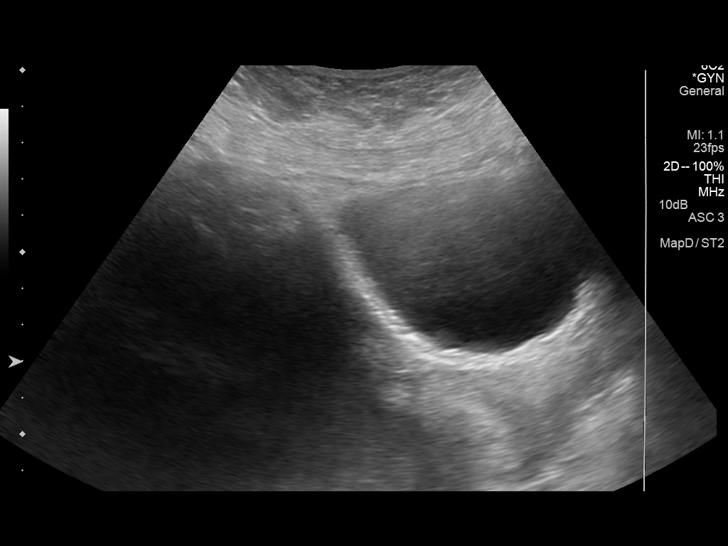
[im 12/143]
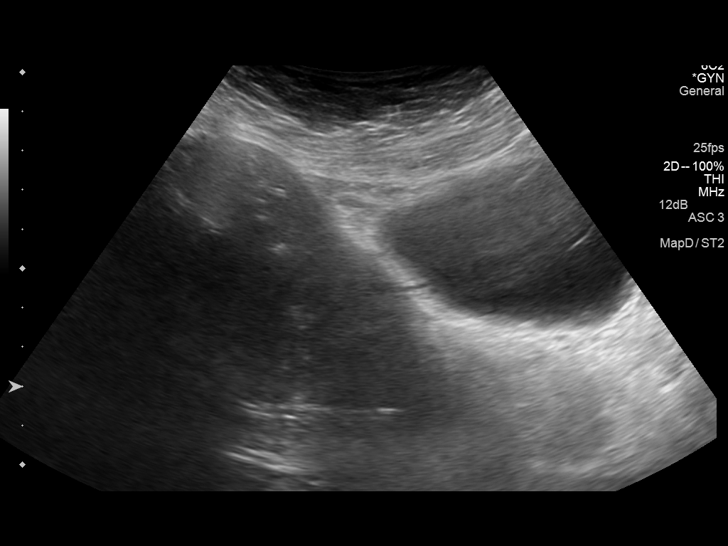
[im 24/143]
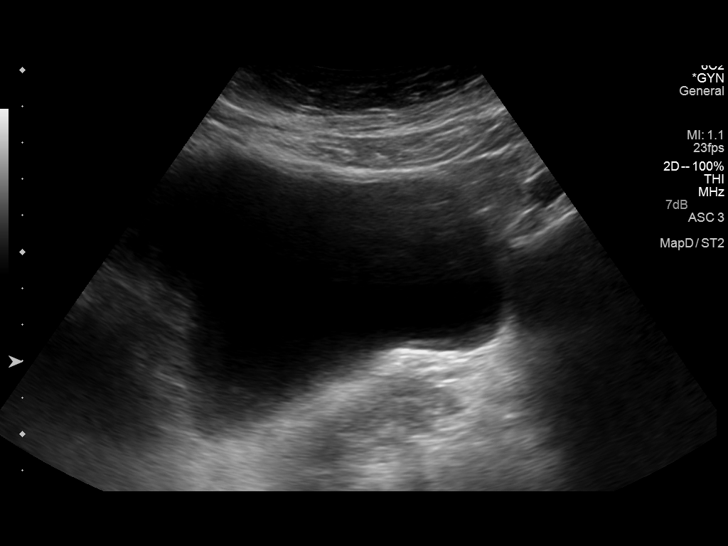
[im 36/143]
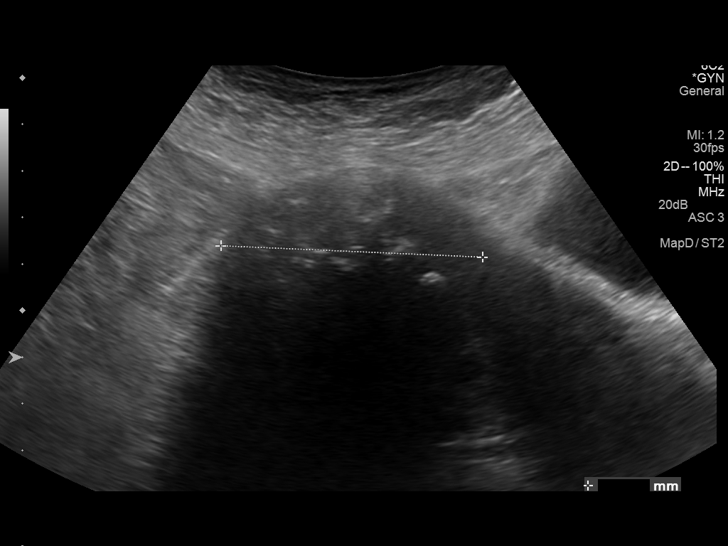
[im 48/143]
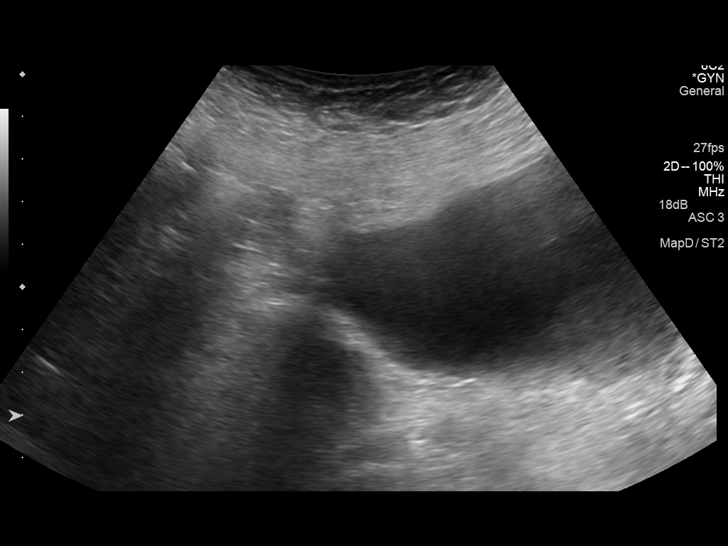
[im 60/143]
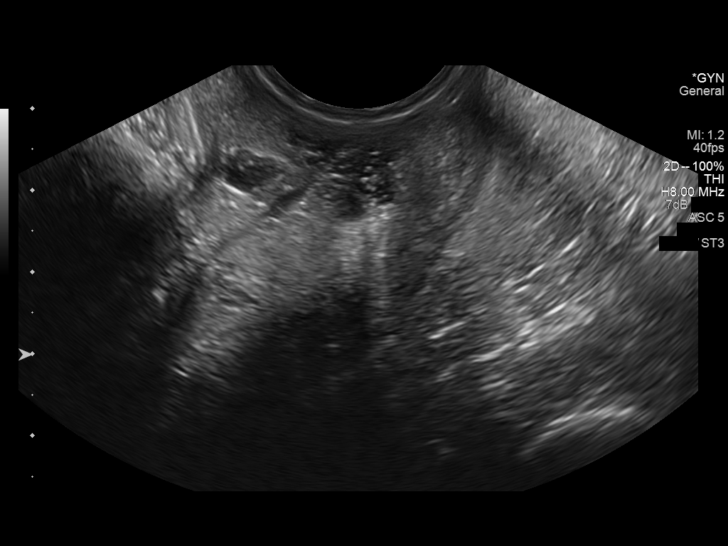
[im 72/143]
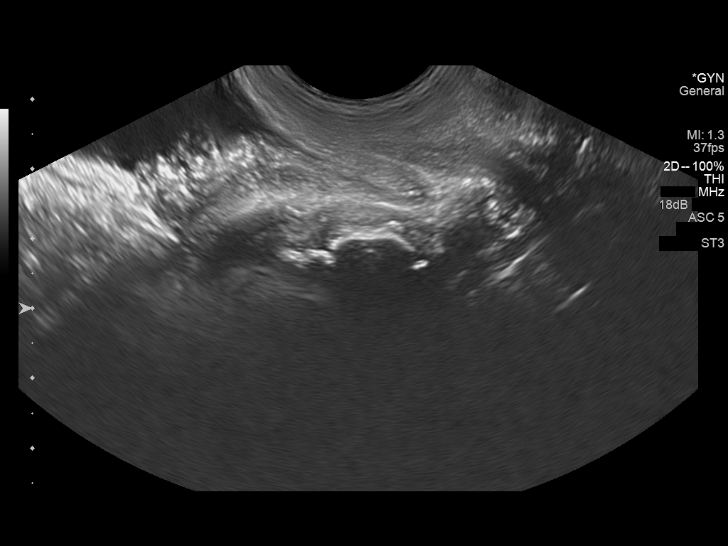
[im 83/143]
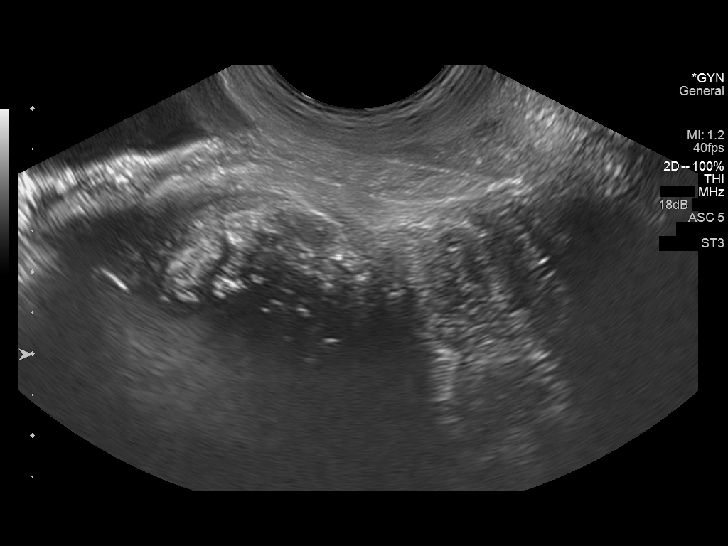
[im 95/143]
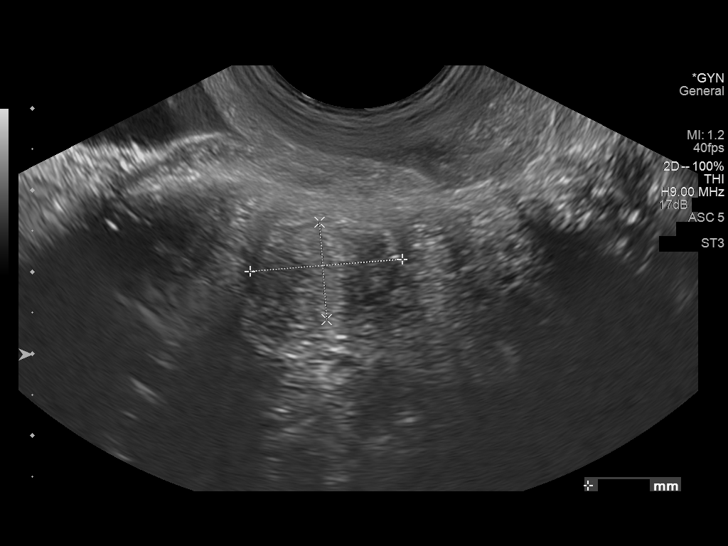
[im 107/143]
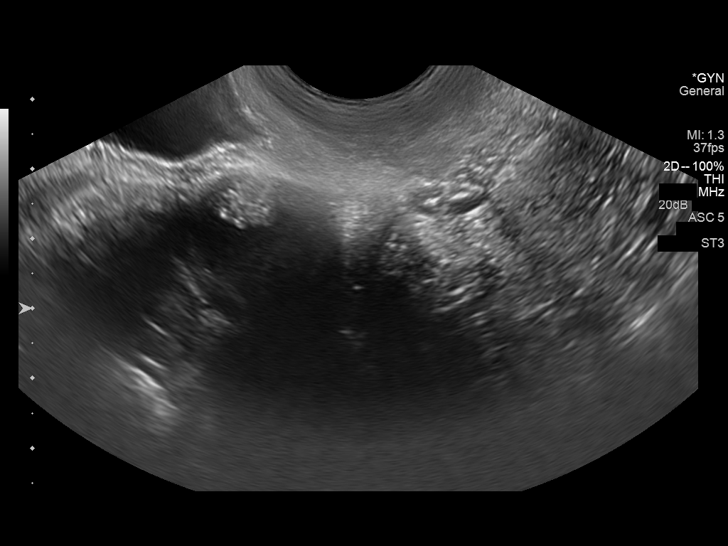
[im 119/143]
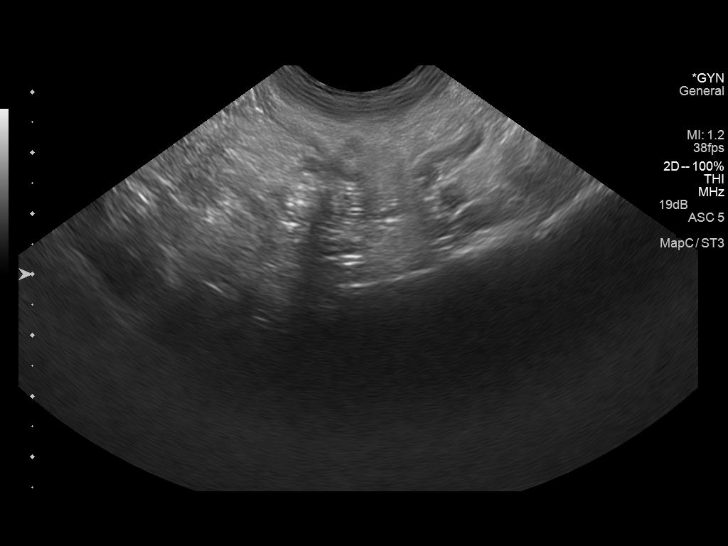
[im 131/143]
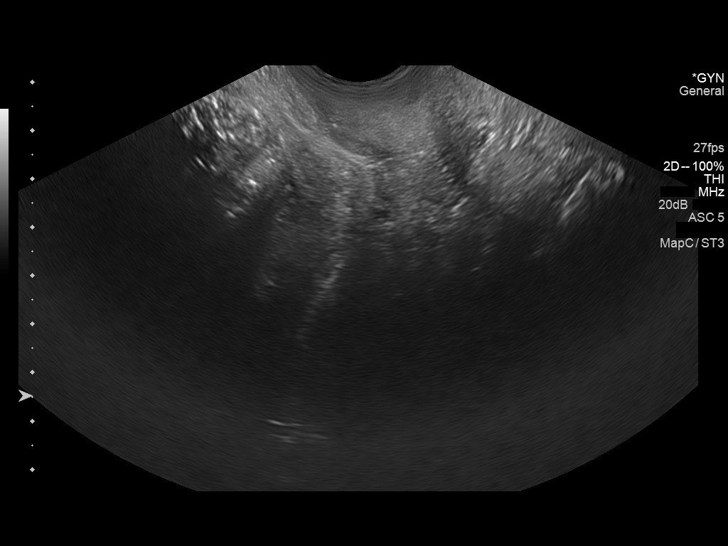
[im 143/143]
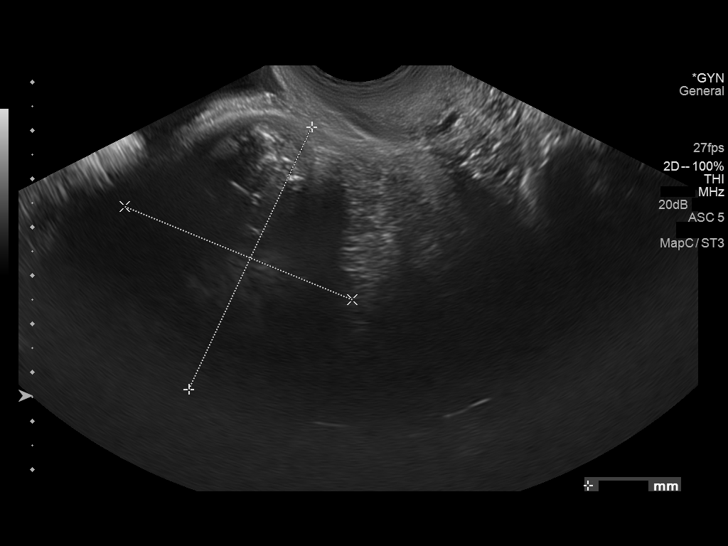

[13 of 25 positions shown; findings below may reference images not displayed]

FINDINGS: Uterus

Measurements: 13.4 x 5.4 x 8.0 cm. The uterus is extremely
heterogeneous with multiple areas of shadowing, most likely
secondary to fibroid uterus. Largest fibroid measures 5.6 cm in
maximum diameter is in the fundus.

Endometrium

Thickness: 4.6 mm.  No focal abnormality visualized.

Right ovary

Measurements: 4.2 x 2.9 x 3.4 cm3.3 x 2.5 x 2.7 cm complex cyst.
Echogenic densities noted within this cyst. This may to represent a
hemorrhagic cyst. A dermoid cyst could present in this fashion.
Other etiologies of ovarian cysts cannot be excluded. Pregnancy test
suggested to exclude ectopic pregnancy.

Left ovary

Not identified.

Other findings

No free fluid.
IMPRESSION: 1.  Fibroid uterus with largest fibroid measuring 5.6 cm.

2. 2.8 x 2.5 x 2.7 cm complex cyst right ovary. Short-interval
follow up ultrasound in 6-12 weeks is recommended, preferably during
the week following the patient's normal menses.

## 2016-09-06 ENCOUNTER — Other Ambulatory Visit: Payer: Self-pay | Admitting: Physician Assistant

## 2016-09-22 ENCOUNTER — Other Ambulatory Visit (HOSPITAL_COMMUNITY)
Admission: RE | Admit: 2016-09-22 | Discharge: 2016-09-22 | Disposition: A | Discharge status: Home / Self Care | Payer: Self-pay | Source: Other Acute Inpatient Hospital | Attending: Physician Assistant | Admitting: Physician Assistant

## 2016-09-22 DIAGNOSIS — I10 Essential (primary) hypertension: Secondary | ICD-10-CM | POA: Insufficient documentation

## 2016-09-23 LAB — HEMOGLOBIN A1C
HEMOGLOBIN A1C: 7.9 % — AB (ref 4.8–5.6)
Mean Plasma Glucose: 180 mg/dL

## 2016-09-24 ENCOUNTER — Encounter: Payer: Self-pay | Admitting: Physician Assistant

## 2016-09-24 ENCOUNTER — Ambulatory Visit: Payer: Self-pay | Admitting: Physician Assistant

## 2016-09-24 VITALS — BP 114/64 | HR 68 | Temp 97.5°F | Ht 59.25 in | Wt 156.5 lb

## 2016-09-24 DIAGNOSIS — I2510 Atherosclerotic heart disease of native coronary artery without angina pectoris: Secondary | ICD-10-CM

## 2016-09-24 DIAGNOSIS — IMO0002 Reserved for concepts with insufficient information to code with codable children: Secondary | ICD-10-CM

## 2016-09-24 DIAGNOSIS — I10 Essential (primary) hypertension: Secondary | ICD-10-CM

## 2016-09-24 DIAGNOSIS — M778 Other enthesopathies, not elsewhere classified: Secondary | ICD-10-CM

## 2016-09-24 DIAGNOSIS — Z794 Long term (current) use of insulin: Principal | ICD-10-CM

## 2016-09-24 DIAGNOSIS — E785 Hyperlipidemia, unspecified: Secondary | ICD-10-CM

## 2016-09-24 DIAGNOSIS — M779 Enthesopathy, unspecified: Secondary | ICD-10-CM

## 2016-09-24 DIAGNOSIS — K219 Gastro-esophageal reflux disease without esophagitis: Secondary | ICD-10-CM

## 2016-09-24 DIAGNOSIS — E1165 Type 2 diabetes mellitus with hyperglycemia: Secondary | ICD-10-CM

## 2016-09-24 DIAGNOSIS — E118 Type 2 diabetes mellitus with unspecified complications: Principal | ICD-10-CM

## 2016-09-24 DIAGNOSIS — D649 Anemia, unspecified: Secondary | ICD-10-CM

## 2016-09-24 NOTE — Progress Notes (Signed)
BP 114/64 (BP Location: Left Arm, Patient Position: Sitting, Cuff Size: Normal)   Pulse 68   Temp 97.5 F (36.4 C) (Other (Comment))   Ht 4' 11.25" (1.505 m)   Wt 156 lb 8 oz (71 kg)   LMP 03/05/2015   SpO2 99%   BMI 31.34 kg/m    Subjective:    Patient ID: Yesenia Hoffman, female    DOB: 1965/08/09, 51 y.o.   MRN: [id number]  HPI: Yesenia Hoffman is a 51 y.o. female presenting on 09/24/2016 for Diabetes and Hyperlipidemia   HPI  Pt has decided not to move to her son's.    she is feeling better.  Her depression is improved.  She did not call cardinal.  She c/o hands hurting and feel swelled  She feels like she can't fully flex L index finger.    She is R hand dominant.    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    bs log- range [id number]  Relevant past medical, surgical, family and social history reviewed and updated as indicated. Interim medical history since our last visit reviewed. Allergies and medications reviewed and updated.   Current Outpatient Prescriptions:  .  amLODipine (NORVASC) 10 MG tablet, TAKE ONE TABLET BY MOUTH ONCE DAILY *TOME UNA TABLETA POR BOCA DIARIA*, Disp: 30 tablet, Rfl: 4 .  aspirin 81 MG tablet, Take 1 tablet (81 mg total) by mouth daily., Disp: , Rfl:  .  fexofenadine (ALLEGRA) 180 MG tablet, Take 180 mg by mouth daily., Disp: , Rfl:  .  fish oil-omega-3 fatty acids 1000 MG capsule, Take 2 capsules by mouth 2 (two) times daily. , Disp: , Rfl:  .  glipiZIDE (GLUCOTROL) 10 MG tablet, TAKE TWO TABLETS BY MOUTH TWICE DAILY BEFORE A MEAL. TOME DOS TABLETAS POR BOCA DOS VECES DIARIAS CON COMIDA., Disp: 120 tablet, Rfl: 4 .  insulin NPH-regular Human (NOVOLIN 70/30 RELION) (70-30) 100 UNIT/ML injection, Inject 42 units SQ before breakfast & 40 units SQ before supper.  Inyecte 42 unidades subcutaneos antes de el desayuno y inyecte 40 unidades subcutaneos antes de la cena, Disp: 1 vial, Rfl: 4 .  lisinopril-hydrochlorothiazide  (PRINZIDE,ZESTORETIC) 20-25 MG tablet, 1 po qd. Tome una tableta por boca diaria, Disp: 30 tablet, Rfl: 1 .  metFORMIN (GLUCOPHAGE) 1000 MG tablet, TAKE ONE TABLET BY MOUTH TWICE DAILY TOME  UNA  TALBETA  POR  BOCA  DOS  VECES  DIARIAS  CON  COMIDA, Disp: 60 tablet, Rfl: 2 .  metoprolol tartrate (LOPRESSOR) 25 MG tablet, 1/2 tab po bid.  Tome media tableta por boca dos veces diarias, Disp: 30 tablet, Rfl: 3 .  Multiple Minerals-Vitamins (CALCIUM & VIT D3 BONE HEALTH PO), Take 1 tablet by mouth 2 (two) times daily., Disp: , Rfl:  .  naproxen (NAPROSYN) 500 MG tablet, 1 po q 12 hours prn pain.  Tome una tableta por boca cada 12 horas cuando sea necesario para el dolor, Disp: 60 tablet, Rfl: 2 .  nitroGLYCERIN (NITROSTAT) 0.4 MG SL tablet, Place 1 tablet under the tongue every 5 minutes as needed for chest pain.  ponga una tableta por abajo de la lengua cada 5 minutos cuando sea necesario para el dolor de pecho, Disp: 25 tablet, Rfl: 3 .  ranitidine (ZANTAC) 300 MG tablet, 1 po qd.  Tome una tableta por boca diaria, Disp: 30 tablet, Rfl: 4 .  Sennosides (TGT NATURAL LAXATIVE PILLS) 25 MG TABS, Take 16.12 mg by mouth 2 (  two) times daily. , Disp: , Rfl:  .  simvastatin (ZOCOR) 20 MG tablet, TAKE ONE TABLET BY MOUTH ONCE DAILY AT BEDTIME. TOME UNA TABLETA POR LA BOCA AL DORMIR., Disp: 30 tablet, Rfl: 5 .  albuterol (PROVENTIL) (2.5 MG/3ML) 0.083% nebulizer solution, Take 14ml by nebulizer every 4 to 6 hours as needed.  Tome 3 ml por nebulizacion cada 4 a 6 horas cuando sea necesario (Patient not taking: Reported on 09/24/2016), Disp: 150 mL, Rfl: 1  Review of Systems  Constitutional: Negative for appetite change, chills, diaphoresis, fatigue, fever and unexpected weight change.  HENT: Positive for dental problem and sneezing. Negative for congestion, drooling, ear pain, facial swelling, hearing loss, mouth sores, sore throat, trouble swallowing and voice change.   Eyes: Negative for pain, discharge, redness,  itching and visual disturbance.  Respiratory: Negative for cough, choking, shortness of breath and wheezing.   Cardiovascular: Positive for leg swelling. Negative for chest pain and palpitations.  Gastrointestinal: Negative for abdominal pain, blood in stool, constipation, diarrhea and vomiting.  Endocrine: Positive for heat intolerance. Negative for cold intolerance and polydipsia.  Genitourinary: Negative for decreased urine volume, dysuria and hematuria.  Musculoskeletal: Negative for arthralgias, back pain and gait problem.  Skin: Negative for rash.  Allergic/Immunologic: Positive for environmental allergies.  Neurological: Positive for headaches. Negative for seizures, syncope and light-headedness.  Hematological: Negative for adenopathy.  Psychiatric/Behavioral: Negative for agitation, dysphoric mood and suicidal ideas. The patient is not nervous/anxious.     Per HPI unless specifically indicated above     Objective:    BP 114/64 (BP Location: Left Arm, Patient Position: Sitting, Cuff Size: Normal)   Pulse 68   Temp 97.5 F (36.4 C) (Other (Comment))   Ht 4' 11.25" (1.505 m)   Wt 156 lb 8 oz (71 kg)   LMP 03/05/2015   SpO2 99%   BMI 31.34 kg/m   Wt Readings from Last 3 Encounters:  09/24/16 156 lb 8 oz (71 kg)  08/25/16 154 lb 12 oz (70.2 kg)  08/21/16 152 lb (68.9 kg)    Physical Exam  Constitutional: She is oriented to person, place, and time. She appears well-developed and well-nourished.  HENT:  Head: Normocephalic and atraumatic.  Neck: Neck supple.  Cardiovascular: Normal rate and regular rhythm.   Pulmonary/Chest: Effort normal and breath sounds normal.  Abdominal: Soft. Bowel sounds are normal. She exhibits no mass. There is no hepatosplenomegaly. There is no tenderness.  Musculoskeletal: She exhibits no edema.       Left hand: She exhibits decreased range of motion. She exhibits no tenderness, no bony tenderness and no deformity.  L index finger unable to  completely flex. Remainder fingers with FROM  Lymphadenopathy:    She has no cervical adenopathy.  Neurological: She is alert and oriented to person, place, and time.  Skin: Skin is warm and dry.  Psychiatric: She has a normal mood and affect. Her behavior is normal.  Vitals reviewed.   Results for orders placed or performed during the hospital encounter of 09/22/16  Hemoglobin A1c  Result Value Ref Range   Hgb A1c MFr Bld 7.9 (H) 4.8 - 5.6 %   Mean Plasma Glucose 180 mg/dL      Assessment & Plan:   Encounter Diagnoses  Name Primary?  Marland Kitchen Uncontrolled type 2 diabetes mellitus with complication, with long-term current use of insulin ([place]) Yes  . Essential hypertension   . Tendonitis of finger   . Hyperlipidemia, unspecified hyperlipidemia type   .  Coronary artery disease involving native coronary artery of native heart without angina pectoris   . Gastroesophageal reflux disease, esophagitis presence not specified   . Anemia, unspecified type      -reviewed labs with pt.  a1c still too high but much improved.  Pt counseled to watch diabetic diet -pt to Continue current meds -Pt already taking naproxen. She is to Ice the finger 10-15 min 4 times daily. Counseled to go exercises with the hand daily, flexing and extending the fingers.  If persists, will need orthopedist -will Refer for annual DM eye exam -f/u 3 months.  RTO sooner prn

## 2016-09-27 ENCOUNTER — Other Ambulatory Visit: Payer: Self-pay | Admitting: Physician Assistant

## 2016-10-23 ENCOUNTER — Other Ambulatory Visit: Payer: Self-pay | Admitting: Physician Assistant

## 2016-10-23 DIAGNOSIS — I2510 Atherosclerotic heart disease of native coronary artery without angina pectoris: Secondary | ICD-10-CM

## 2016-10-23 DIAGNOSIS — E785 Hyperlipidemia, unspecified: Secondary | ICD-10-CM

## 2016-10-23 DIAGNOSIS — IMO0002 Reserved for concepts with insufficient information to code with codable children: Secondary | ICD-10-CM

## 2016-10-23 DIAGNOSIS — E1165 Type 2 diabetes mellitus with hyperglycemia: Secondary | ICD-10-CM

## 2016-10-23 DIAGNOSIS — D649 Anemia, unspecified: Secondary | ICD-10-CM

## 2016-10-23 DIAGNOSIS — I10 Essential (primary) hypertension: Secondary | ICD-10-CM

## 2016-10-23 DIAGNOSIS — E118 Type 2 diabetes mellitus with unspecified complications: Principal | ICD-10-CM

## 2016-10-23 DIAGNOSIS — Z794 Long term (current) use of insulin: Principal | ICD-10-CM

## 2016-11-26 ENCOUNTER — Other Ambulatory Visit: Payer: Self-pay | Admitting: Physician Assistant

## 2016-11-27 ENCOUNTER — Encounter: Payer: Self-pay | Admitting: Physician Assistant

## 2016-12-21 ENCOUNTER — Other Ambulatory Visit: Payer: Self-pay | Admitting: Physician Assistant

## 2016-12-22 ENCOUNTER — Other Ambulatory Visit (HOSPITAL_COMMUNITY)
Admission: RE | Admit: 2016-12-22 | Discharge: 2016-12-22 | Disposition: A | Discharge status: Home / Self Care | Payer: Self-pay | Source: Ambulatory Visit | Attending: Physician Assistant | Admitting: Physician Assistant

## 2016-12-22 DIAGNOSIS — E118 Type 2 diabetes mellitus with unspecified complications: Secondary | ICD-10-CM | POA: Insufficient documentation

## 2016-12-22 DIAGNOSIS — I2510 Atherosclerotic heart disease of native coronary artery without angina pectoris: Secondary | ICD-10-CM | POA: Insufficient documentation

## 2016-12-22 DIAGNOSIS — D649 Anemia, unspecified: Secondary | ICD-10-CM | POA: Insufficient documentation

## 2016-12-22 DIAGNOSIS — E1165 Type 2 diabetes mellitus with hyperglycemia: Secondary | ICD-10-CM | POA: Insufficient documentation

## 2016-12-22 DIAGNOSIS — E785 Hyperlipidemia, unspecified: Secondary | ICD-10-CM | POA: Insufficient documentation

## 2016-12-22 DIAGNOSIS — Z794 Long term (current) use of insulin: Secondary | ICD-10-CM | POA: Insufficient documentation

## 2016-12-22 DIAGNOSIS — IMO0002 Reserved for concepts with insufficient information to code with codable children: Secondary | ICD-10-CM

## 2016-12-22 DIAGNOSIS — I10 Essential (primary) hypertension: Secondary | ICD-10-CM | POA: Insufficient documentation

## 2016-12-22 LAB — LIPID PANEL
Cholesterol: 100 mg/dL (ref 0–200)
HDL: 31 mg/dL — AB (ref >40)
LDL CALC: 33 mg/dL (ref 0–99)
TRIGLYCERIDES: 182 mg/dL — AB (ref <150)
Total CHOL/HDL Ratio: 3.2 RATIO
VLDL: 36 mg/dL (ref 0–40)

## 2016-12-22 LAB — COMPREHENSIVE METABOLIC PANEL
ALBUMIN: 4 g/dL (ref 3.5–5.0)
ALK PHOS: 93 U/L (ref 38–126)
ALT: 21 U/L (ref 14–54)
ANION GAP: 11 (ref 5–15)
AST: 22 U/L (ref 15–41)
BILIRUBIN TOTAL: 0.4 mg/dL (ref 0.3–1.2)
BUN: 10 mg/dL (ref 6–20)
CALCIUM: 9.3 mg/dL (ref 8.9–10.3)
CO2: 26 mmol/L (ref 22–32)
CREATININE: 0.48 mg/dL (ref 0.44–1.00)
Chloride: 99 mmol/L — ABNORMAL LOW (ref 101–111)
GFR calc Af Amer: >60 mL/min (ref >60)
GFR calc non Af Amer: >60 mL/min (ref >60)
GLUCOSE: 218 mg/dL — AB (ref 65–99)
Potassium: 4.2 mmol/L (ref 3.5–5.1)
Sodium: 136 mmol/L (ref 135–145)
TOTAL PROTEIN: 7.4 g/dL (ref 6.5–8.1)

## 2016-12-22 LAB — HEMOGLOBIN AND HEMATOCRIT, BLOOD
HEMATOCRIT: 37.8 % (ref 36.0–46.0)
HEMOGLOBIN: 12.5 g/dL (ref 12.0–15.0)

## 2016-12-22 LAB — HEMOGLOBIN A1C
HEMOGLOBIN A1C: 7.8 % — AB (ref 4.8–5.6)
Mean Plasma Glucose: 177.16 mg/dL

## 2016-12-24 ENCOUNTER — Ambulatory Visit: Payer: Self-pay | Admitting: Physician Assistant

## 2017-01-01 ENCOUNTER — Encounter: Payer: Self-pay | Admitting: Physician Assistant

## 2017-01-01 ENCOUNTER — Ambulatory Visit: Payer: Self-pay | Admitting: Physician Assistant

## 2017-01-01 ENCOUNTER — Other Ambulatory Visit: Payer: Self-pay | Admitting: Physician Assistant

## 2017-01-01 VITALS — BP 112/66 | HR 76 | Temp 97.7°F | Ht 59.25 in | Wt 157.0 lb

## 2017-01-01 DIAGNOSIS — E1165 Type 2 diabetes mellitus with hyperglycemia: Secondary | ICD-10-CM

## 2017-01-01 DIAGNOSIS — E118 Type 2 diabetes mellitus with unspecified complications: Principal | ICD-10-CM

## 2017-01-01 DIAGNOSIS — E785 Hyperlipidemia, unspecified: Secondary | ICD-10-CM

## 2017-01-01 DIAGNOSIS — IMO0002 Reserved for concepts with insufficient information to code with codable children: Secondary | ICD-10-CM

## 2017-01-01 DIAGNOSIS — Z794 Long term (current) use of insulin: Principal | ICD-10-CM

## 2017-01-01 DIAGNOSIS — Z1211 Encounter for screening for malignant neoplasm of colon: Secondary | ICD-10-CM

## 2017-01-01 DIAGNOSIS — I10 Essential (primary) hypertension: Secondary | ICD-10-CM

## 2017-01-01 DIAGNOSIS — E1140 Type 2 diabetes mellitus with diabetic neuropathy, unspecified: Secondary | ICD-10-CM

## 2017-01-01 MED ORDER — GLIPIZIDE 10 MG PO TABS: ORAL_TABLET | ORAL | 3 refills | Status: DC

## 2017-01-01 MED ORDER — AMLODIPINE BESYLATE 10 MG PO TABS: ORAL_TABLET | ORAL | 3 refills | Status: DC

## 2017-01-01 MED ORDER — GABAPENTIN 300 MG PO CAPS: ORAL_CAPSULE | ORAL | 2 refills | Status: DC

## 2017-01-01 MED ORDER — ATORVASTATIN CALCIUM 20 MG PO TABS: ORAL_TABLET | ORAL | 3 refills | Status: DC

## 2017-01-01 MED ORDER — LISINOPRIL-HYDROCHLOROTHIAZIDE 20-25 MG PO TABS
ORAL_TABLET | ORAL | 3 refills | Status: DC
Start: 2017-01-01 — End: 2017-05-04

## 2017-01-01 MED ORDER — METFORMIN HCL 1000 MG PO TABS: ORAL_TABLET | ORAL | 3 refills | Status: DC

## 2017-01-01 MED ORDER — METOPROLOL TARTRATE 25 MG PO TABS: ORAL_TABLET | ORAL | 3 refills | Status: DC

## 2017-01-01 MED ORDER — RANITIDINE HCL 300 MG PO TABS: ORAL_TABLET | ORAL | 3 refills | Status: DC

## 2017-01-01 NOTE — Patient Instructions (Signed)
Ejercicios de Training and development officer) Los ejercicios de manos pueden ser tiles para Forensic scientist. Estos ejercicios pueden fortalecer las manos, mejorar la flexibilidad y [place], y aumentar el flujo de sangre a las [place]. Estos resultados pueden facilitar el trabajo y las tareas diarias. Los ejercicios de manos pueden ser especialmente provechosos para la gente que tiene dolor articular provocado por la artritis o tienen dao del nervio por uso excesivo (sndrome del tnel carpiano). Estos ejercicios tambin pueden ayudar a las personas que se lesionan [place]. La mayora de estos ejercicios son rutinas de elongacin bastante suaves. Puede hacerlos frecuentemente Agricultural consultant. Aun as, es conveniente preguntar al mdico qu ejercicios son aconsejables para usted. Calentar las manos antes del ejercicio puede ayudar a reducir la rigidez. Puede hacer esto con un masaje suave o sumergiendo las manos en agua tibia durante 15 minutos. Adems, al comenzar una rutina de ejercicios, asegrese de Sales promotion account executive atencin al nivel de dolor de la [place]. EJERCICIOS Flexin de los nudillos Repita este ejercicio entre 5 y 31 veces con cada mano. 1. Prese o sintese con el brazo, la mano y los cinco dedos apuntando [place]. Asegrese de que la mueca est en posicin recta. 2. Flexione los dedos suave y lentamente hacia abajo y hacia adentro hasta que las puntas de los dedos toquen la parte superior de la palma de la [place]. 3. Mantenga esta posicin durante algunossegundos. 4. Vuelva a extender los dedos a la posicin original, todos apuntando [place] nuevamente. El ventilador Repita este ejercicio entre 5 y 19 veces con cada mano. 1. Extienda el brazo y la mano frente a usted. Mantenga la mueca en posicin recta. 2. Apriete la mano en un puo. 3. Mantenga esta posicin durante algunossegundos. 4. brala, o separe la mano y los dedos tanto como sea posible, elongando cada articulacin  completamente. La mesa Repita este ejercicio entre 5 y 52 veces con cada mano. 1. Prese o sintese con el brazo, la mano y los cinco dedos apuntando [place]. Asegrese de que la mueca est en posicin recta. 2. Flexione los dedos suave y lentamente a la altura de los nudillos, donde se encuentran con la mano, Nurse, children's que la mano alcance la forma de una "L" invertida. Los dedos deben formar una mesa. 3. Mantenga esta posicin durante algunossegundos. 4. Vuelva a extender los dedos a la posicin original, todos apuntando [place] nuevamente. La "O" Repita este ejercicio entre 5 y 60 veces con cada mano. 1. Prese o sintese con el brazo, la mano y los cinco dedos apuntando [place]. Asegrese de que la mueca est en posicin recta. 2. Haga la forma de una "O" tocando el dedo pulgar con el dedo ndice. Mantenga esta posicin durante unos segundos. Luego abra la mano completamente. 3. Repita este movimiento con cada dedo de la mano. El mantel Repita este ejercicio entre 5 y 70 veces con cada mano. 1. Coloque su mano sobre una mesa con la palma [place]. Asegrese de que la mueca est en posicin recta. 2. Extienda los dedos tanto como sea posible. Mantenga esta posicin durante algunossegundos. 3. Deslice los dedos y vuelva a unirlos. Mantenga esta posicin durante unos segundos. Agarrar la pelota Repita este ejercicio entre 10 y 59 veces con cada mano. 1. Sostenga una pelota de tenis o una pelota liviana en la mano. 2. Mientras aumenta lentamente la presin, apriete la pelota con la mayor fuerza posible. 3. Apritela con la mayor fuerza posible durante 3 a  5 segundos. 4. Reljese y luego repita el ejercicio. El bucle Repita este ejercicio entre 10 y 3 veces con cada mano. 1. Sintese en una silla que tenga apoyabrazos. 2. Sostenga algo liviano en la [place], tal como una mancuerna que [place] 1 y 3 libras (0,5 a 1,4 kilogramos). Consulte al mdico qu peso sera ms  adecuado para usted. 3. Apoye la mano justo sobre el extremo del brazo de la silla con la palma [place] arriba. 4. Gire suavemente la [place] y [place] abajo mientras sostiene [place]. No gire la [place] de un lado a otro. SOLICITE ATENCIN MDICA SI:  El dolor o las molestias en la mano se vuelven mucho ms intensos cuando hace un ejercicio.  El dolor o las molestias en la mano no mejoran en el trmino de las 2horas posteriores a Therapist, art. Si tiene alguno de [employer], deje de [employer] ejercicios de inmediato. No vuelva a hacer los ejercicios a menos que el mdico lo autorice. SOLICITE ATENCIN MDICA DE INMEDIATO SI:  Siente un dolor sbito e intenso en la mano. Si esto ocurre, deje de Dispensing optician ejercicios de inmediato. No vuelva a hacer los ejercicios a menos que el mdico lo autorice. Esta informacin no tiene Marine scientist el consejo del mdico. Asegrese de hacerle al mdico cualquier pregunta que tenga. Document Released: 05/11/2015 Document Revised: 05/11/2015 Document Reviewed: 10/23/2014 Elsevier Interactive Patient Education  [employer].

## 2017-01-01 NOTE — Progress Notes (Signed)
BP 112/66 (BP Location: Left Arm, Patient Position: Sitting, Cuff Size: Normal)   Pulse 76   Temp 97.7 F (36.5 C)   Ht 4' 11.25" (1.505 m)   Wt 157 lb (71.2 kg)   LMP 03/05/2015   SpO2 98%   BMI 31.44 kg/m    Subjective:    Patient ID: Yesenia Hoffman, female    DOB: 1966-02-25, 51 y.o.   MRN: [id number]  HPI: Yesenia Hoffman is a 51 y.o. female presenting on 01/01/2017 for Diabetes   HPI   Pt says her hands hurt and tingle.  She says it is mostly all the time.  She says she can't sleep sometimes due to the pain.    Pt spends about $80 / month on meds.   Sometimes LE edema.  No CP. occassional sob.   Relevant past medical, surgical, family and social history reviewed and updated as indicated. Interim medical history since our last visit reviewed. Allergies and medications reviewed and updated.   Current Outpatient Prescriptions:  .  amLODipine (NORVASC) 10 MG tablet, TAKE 1 TABLET BY MOUTH ONCE DAILY *TOME  UNA  TABLETA  POR  BOCA  DIARIA*, Disp: 30 tablet, Rfl: 4 .  aspirin 81 MG tablet, Take 1 tablet (81 mg total) by mouth daily., Disp: , Rfl:  .  fexofenadine (ALLEGRA) 180 MG tablet, Take 180 mg by mouth daily., Disp: , Rfl:  .  fish oil-omega-3 fatty acids 1000 MG capsule, Take 2 capsules by mouth 2 (two) times daily. , Disp: , Rfl:  .  glipiZIDE (GLUCOTROL) 10 MG tablet, TAKE 2 TABLETS BY MOUTH TWICE DAILY WITH A MEAL *TOME  DOS  TABLETAS  POR  BOCA  DOS  VECES  DIARIAS  CON  COMIDA*, Disp: 120 tablet, Rfl: 4 .  insulin NPH-regular Human (NOVOLIN 70/30 RELION) (70-30) 100 UNIT/ML injection, Inject 42 units SQ before breakfast & 40 units SQ before supper.  Inyecte 42 unidades subcutaneos antes de el desayuno y inyecte 40 unidades subcutaneos antes de la cena, Disp: 1 vial, Rfl: 4 .  lisinopril-hydrochlorothiazide (PRINZIDE,ZESTORETIC) 20-25 MG tablet, 1 po qd. Tome una tableta por boca diaria, Disp: 30 tablet, Rfl: 1 .  metFORMIN (GLUCOPHAGE) 1000 MG tablet, TAKE 1 TABLET  BY MOUTH TWICE DAILY. TOME UNA TABLETA POR BOCA DOS VECES DIARIAS CON COMIDA., Disp: 60 tablet, Rfl: 2 .  metoprolol tartrate (LOPRESSOR) 25 MG tablet, TAKE 1/2 (ONE-HALF) TABLET BY MOUTH TWICE DAILY. TOME MEDIA TABLETA POR BOCA DOS VECES DIARIAS., Disp: 30 tablet, Rfl: 3 .  Multiple Minerals-Vitamins (CALCIUM & VIT D3 BONE HEALTH PO), Take 1 tablet by mouth 2 (two) times daily., Disp: , Rfl:  .  naproxen (NAPROSYN) 500 MG tablet, 1 po q 12 hours prn pain.  Tome una tableta por boca cada 12 horas cuando sea necesario para el dolor, Disp: 60 tablet, Rfl: 2 .  nitroGLYCERIN (NITROSTAT) 0.4 MG SL tablet, Place 1 tablet under the tongue every 5 minutes as needed for chest pain.  ponga una tableta por abajo de la lengua cada 5 minutos cuando sea necesario para el dolor de pecho, Disp: 25 tablet, Rfl: 3 .  ranitidine (ZANTAC) 300 MG tablet, 1 po qd.  Tome una tableta por boca diaria, Disp: 30 tablet, Rfl: 4 .  Sennosides (TGT NATURAL LAXATIVE PILLS) 25 MG TABS, Take 16.12 mg by mouth 2 (two) times daily. , Disp: , Rfl:  .  simvastatin (ZOCOR) 20 MG tablet, TAKE ONE TABLET BY MOUTH AT BEDTIME  TOME  UNA  TABLETA  POR  LA  BOCA  AL  DORMIR, Disp: 30 tablet, Rfl: 5   Review of Systems  Constitutional: Positive for fatigue. Negative for appetite change, chills, diaphoresis, fever and unexpected weight change.  HENT: Positive for dental problem and mouth sores. Negative for congestion, drooling, ear pain, facial swelling, hearing loss, sneezing, sore throat, trouble swallowing and voice change.   Eyes: Negative for pain, discharge, redness, itching and visual disturbance.  Respiratory: Positive for shortness of breath. Negative for cough, choking and wheezing.   Cardiovascular: Negative for chest pain, palpitations and leg swelling.  Gastrointestinal: Negative for abdominal pain, blood in stool, constipation, diarrhea and vomiting.  Endocrine: Negative for cold intolerance, heat intolerance and polydipsia.   Genitourinary: Negative for decreased urine volume, dysuria and hematuria.  Musculoskeletal: Negative for arthralgias, back pain and gait problem.  Skin: Negative for rash.  Allergic/Immunologic: Negative for environmental allergies.  Neurological: Negative for seizures, syncope, light-headedness and headaches.  Hematological: Negative for adenopathy.  Psychiatric/Behavioral: Negative for agitation, dysphoric mood and suicidal ideas. The patient is not nervous/anxious.     Per HPI unless specifically indicated above     Objective:    BP 112/66 (BP Location: Left Arm, Patient Position: Sitting, Cuff Size: Normal)   Pulse 76   Temp 97.7 F (36.5 C)   Ht 4' 11.25" (1.505 m)   Wt 157 lb (71.2 kg)   LMP 03/05/2015   SpO2 98%   BMI 31.44 kg/m   Wt Readings from Last 3 Encounters:  01/01/17 157 lb (71.2 kg)  09/24/16 156 lb 8 oz (71 kg)  08/25/16 154 lb 12 oz (70.2 kg)    Physical Exam  Constitutional: She is oriented to person, place, and time. She appears well-developed and well-nourished.  HENT:  Head: Normocephalic and atraumatic.  Neck: Neck supple.  Cardiovascular: Normal rate and regular rhythm.   Pulmonary/Chest: Effort normal and breath sounds normal.  Abdominal: Soft. Bowel sounds are normal. She exhibits no mass. There is no hepatosplenomegaly. There is no tenderness.  Musculoskeletal: She exhibits edema (trace - 1+ BLE edema).       Right hand: She exhibits normal range of motion and no tenderness. Decreased strength (decreased grip strength) noted.       Left hand: She exhibits decreased range of motion (L index finger unable to fully flex). She exhibits no tenderness, no bony tenderness, normal capillary refill and no deformity. Normal sensation noted. Decreased strength (decreased grip strength) noted.  Both hands with pain. Neither tender.   Lymphadenopathy:    She has no cervical adenopathy.  Neurological: She is alert and oriented to person, place, and time.   Skin: Skin is warm and dry.  Psychiatric: She has a normal mood and affect. Her behavior is normal.  Vitals reviewed.      Results for orders placed or performed during the hospital encounter of 12/22/16  Comprehensive metabolic panel  Result Value Ref Range   Sodium 136 135 - 145 mmol/L   Potassium 4.2 3.5 - 5.1 mmol/L   Chloride 99 (L) 101 - 111 mmol/L   CO2 26 22 - 32 mmol/L   Glucose, Bld 218 (H) 65 - 99 mg/dL   BUN 10 6 - 20 mg/dL   Creatinine, Ser 0.48 0.44 - 1.00 mg/dL   Calcium 9.3 8.9 - 10.3 mg/dL   Total Protein 7.4 6.5 - 8.1 g/dL   Albumin 4.0 3.5 - 5.0 g/dL   AST 22 15 - 41  U/L   ALT 21 14 - 54 U/L   Alkaline Phosphatase 93 38 - 126 U/L   Total Bilirubin 0.4 0.3 - 1.2 mg/dL   GFR calc non Af Amer >60 >60 mL/min   GFR calc Af Amer >60 >60 mL/min   Anion gap 11 5 - 15  Lipid panel  Result Value Ref Range   Cholesterol 100 0 - 200 mg/dL   Triglycerides 182 (H) <150 mg/dL   HDL 31 (L) >40 mg/dL   Total CHOL/HDL Ratio 3.2 RATIO   VLDL 36 0 - 40 mg/dL   LDL Cholesterol 33 0 - 99 mg/dL  Hemoglobin A1c  Result Value Ref Range   Hgb A1c MFr Bld 7.8 (H) 4.8 - 5.6 %   Mean Plasma Glucose 177.16 mg/dL  Hemoglobin and hematocrit, blood  Result Value Ref Range   Hemoglobin 12.5 12.0 - 15.0 g/dL   HCT 37.8 36.0 - 46.0 %      Assessment & Plan:   Encounter Diagnoses  Name Primary?  Marland Kitchen Uncontrolled type 2 diabetes mellitus with complication, with long-term current use of insulin ([place]) Yes  . Essential hypertension   . Type 2 diabetes mellitus with diabetic neuropathy, unspecified whether long term insulin use ([place])   . Hyperlipidemia, unspecified hyperlipidemia type   . Screening for colon cancer      -reviewd labs with pt -pt was given ifobt for colon cancer screening -pt is signed up for medassist to help with cost of her meds -no changes in current meds -add gabapentin for neuropathy -pt had normal echo nov 2017.  Encouraged her to elevate feet when  seated.  -pt counseled to watch diabetic diet and continue current dm meds.  Notify office for fbs < 70 or > 300 -pt is given hand exercises to do to help her hand strength and ROM -pt to follow up 3 months.  RTO sooner prn

## 2017-02-27 ENCOUNTER — Other Ambulatory Visit: Payer: Self-pay | Admitting: Physician Assistant

## 2017-03-24 ENCOUNTER — Other Ambulatory Visit (HOSPITAL_COMMUNITY)
Admission: RE | Admit: 2017-03-24 | Discharge: 2017-03-24 | Disposition: A | Discharge status: Home / Self Care | Payer: Self-pay | Source: Ambulatory Visit | Attending: Physician Assistant | Admitting: Physician Assistant

## 2017-03-24 DIAGNOSIS — E1140 Type 2 diabetes mellitus with diabetic neuropathy, unspecified: Secondary | ICD-10-CM | POA: Insufficient documentation

## 2017-03-24 DIAGNOSIS — E785 Hyperlipidemia, unspecified: Secondary | ICD-10-CM | POA: Insufficient documentation

## 2017-03-24 DIAGNOSIS — Z794 Long term (current) use of insulin: Secondary | ICD-10-CM | POA: Insufficient documentation

## 2017-03-24 DIAGNOSIS — E118 Type 2 diabetes mellitus with unspecified complications: Secondary | ICD-10-CM | POA: Insufficient documentation

## 2017-03-24 DIAGNOSIS — I10 Essential (primary) hypertension: Secondary | ICD-10-CM | POA: Insufficient documentation

## 2017-03-24 DIAGNOSIS — E1165 Type 2 diabetes mellitus with hyperglycemia: Secondary | ICD-10-CM | POA: Insufficient documentation

## 2017-03-24 DIAGNOSIS — IMO0002 Reserved for concepts with insufficient information to code with codable children: Secondary | ICD-10-CM

## 2017-03-24 LAB — LIPID PANEL
CHOLESTEROL: 107 mg/dL (ref 0–200)
HDL: 26 mg/dL — ABNORMAL LOW (ref >40)
LDL Cholesterol: 23 mg/dL (ref 0–99)
TRIGLYCERIDES: 288 mg/dL — AB (ref <150)
Total CHOL/HDL Ratio: 4.1 RATIO
VLDL: 58 mg/dL — ABNORMAL HIGH (ref 0–40)

## 2017-03-24 LAB — COMPREHENSIVE METABOLIC PANEL
ALBUMIN: 4.2 g/dL (ref 3.5–5.0)
ALT: 19 U/L (ref 14–54)
ANION GAP: 10 (ref 5–15)
AST: 22 U/L (ref 15–41)
Alkaline Phosphatase: 97 U/L (ref 38–126)
BUN: 14 mg/dL (ref 6–20)
CHLORIDE: 98 mmol/L — AB (ref 101–111)
CO2: 26 mmol/L (ref 22–32)
Calcium: 9.3 mg/dL (ref 8.9–10.3)
Creatinine, Ser: 0.5 mg/dL (ref 0.44–1.00)
GFR calc non Af Amer: >60 mL/min (ref >60)
GLUCOSE: 216 mg/dL — AB (ref 65–99)
Potassium: 3.9 mmol/L (ref 3.5–5.1)
SODIUM: 134 mmol/L — AB (ref 135–145)
Total Bilirubin: 0.5 mg/dL (ref 0.3–1.2)
Total Protein: 7.8 g/dL (ref 6.5–8.1)

## 2017-03-24 LAB — HEMOGLOBIN A1C
Hgb A1c MFr Bld: 8.9 % — ABNORMAL HIGH (ref 4.8–5.6)
MEAN PLASMA GLUCOSE: 208.73 mg/dL

## 2017-03-25 LAB — MICROALBUMIN, URINE: Microalb, Ur: 13 ug/mL — ABNORMAL HIGH

## 2017-04-02 ENCOUNTER — Ambulatory Visit: Payer: Self-pay | Admitting: Physician Assistant

## 2017-04-09 ENCOUNTER — Ambulatory Visit: Payer: Self-pay | Admitting: Physician Assistant

## 2017-04-26 ENCOUNTER — Other Ambulatory Visit: Payer: Self-pay | Admitting: Physician Assistant

## 2017-04-27 ENCOUNTER — Other Ambulatory Visit: Payer: Self-pay | Admitting: Physician Assistant

## 2017-04-27 MED ORDER — METOPROLOL TARTRATE 25 MG PO TABS: ORAL_TABLET | ORAL | 3 refills | Status: DC

## 2017-04-27 MED ORDER — AMLODIPINE BESYLATE 10 MG PO TABS: ORAL_TABLET | ORAL | 3 refills | Status: DC

## 2017-04-27 MED ORDER — RANITIDINE HCL 300 MG PO TABS: ORAL_TABLET | ORAL | 4 refills | Status: DC

## 2017-04-27 MED ORDER — GLIPIZIDE 10 MG PO TABS: ORAL_TABLET | ORAL | 3 refills | Status: DC

## 2017-04-30 ENCOUNTER — Other Ambulatory Visit: Payer: Self-pay | Admitting: Physician Assistant

## 2017-04-30 DIAGNOSIS — Z1211 Encounter for screening for malignant neoplasm of colon: Secondary | ICD-10-CM

## 2017-04-30 LAB — IFOBT (OCCULT BLOOD): IMMUNOLOGICAL FECAL OCCULT BLOOD TEST: NEGATIVE

## 2017-05-04 ENCOUNTER — Encounter: Payer: Self-pay | Admitting: Physician Assistant

## 2017-05-04 ENCOUNTER — Ambulatory Visit: Payer: Self-pay | Admitting: Physician Assistant

## 2017-05-04 VITALS — BP 116/70 | HR 77 | Temp 97.3°F | Ht 59.25 in | Wt 148.0 lb

## 2017-05-04 DIAGNOSIS — E1165 Type 2 diabetes mellitus with hyperglycemia: Secondary | ICD-10-CM

## 2017-05-04 DIAGNOSIS — IMO0002 Reserved for concepts with insufficient information to code with codable children: Secondary | ICD-10-CM

## 2017-05-04 DIAGNOSIS — E1140 Type 2 diabetes mellitus with diabetic neuropathy, unspecified: Secondary | ICD-10-CM

## 2017-05-04 DIAGNOSIS — F329 Major depressive disorder, single episode, unspecified: Secondary | ICD-10-CM

## 2017-05-04 DIAGNOSIS — Z794 Long term (current) use of insulin: Secondary | ICD-10-CM

## 2017-05-04 DIAGNOSIS — Z1239 Encounter for other screening for malignant neoplasm of breast: Secondary | ICD-10-CM

## 2017-05-04 DIAGNOSIS — E785 Hyperlipidemia, unspecified: Secondary | ICD-10-CM

## 2017-05-04 DIAGNOSIS — I10 Essential (primary) hypertension: Secondary | ICD-10-CM

## 2017-05-04 DIAGNOSIS — E118 Type 2 diabetes mellitus with unspecified complications: Secondary | ICD-10-CM

## 2017-05-04 DIAGNOSIS — F32A Depression, unspecified: Secondary | ICD-10-CM

## 2017-05-04 DIAGNOSIS — F4321 Adjustment disorder with depressed mood: Secondary | ICD-10-CM

## 2017-05-04 NOTE — Progress Notes (Signed)
BP 116/70 (BP Location: Left Arm, Patient Position: Sitting, Cuff Size: Normal)   Pulse 77   Temp (!) 97.3 F (36.3 C) (Other (Comment))   Ht 4' 11.25" (1.505 m)   Wt 148 lb (67.1 kg)   LMP 03/05/2015   SpO2 95%   BMI 29.64 kg/m    Subjective:    Patient ID: Yesenia Hoffman, female    DOB: 06-10-1965, 52 y.o.   MRN: [id number]  HPI: Yesenia Hoffman is a 52 y.o. female presenting on 05/04/2017 for Peripheral Neuropathy   HPI   Pt says pain in hands is the same. Says the R hands swells a lot.  She did not do the exercises for the hands that she was given.   Pt says she is having a lot of depression since her son-in-law died in 21-Mar-2023.  Her daughter is still in [place] and that is adding to her stress.  Her 4 grandchildren are there as well.   She isn't talking with anyone about her depression. She isn't eating much.  She has lost 9 pounds since she was here in September.  She is having insomnia.   Pt was given gabapentin but she never got on it because she never brought in the papers for medassist.    Relevant past medical, surgical, family and social history reviewed and updated as indicated. Interim medical history since our last visit reviewed. Allergies and medications reviewed and updated.   Current Outpatient Medications:  .  amLODipine (NORVASC) 10 MG tablet, TAKE 1 TABLET BY MOUTH ONCE DAILY *TOME  UNA  TABLETA  POR  BOCA  DIARIA*, Disp: 30 tablet, Rfl: 3 .  aspirin 81 MG tablet, Take 1 tablet (81 mg total) by mouth daily., Disp: , Rfl:  .  fexofenadine (ALLEGRA) 180 MG tablet, Take 180 mg by mouth daily., Disp: , Rfl:  .  Flaxseed, Linseed, (FLAXSEED OIL) 1000 MG CAPS, Take 2 capsules by mouth 2 (two) times daily., Disp: , Rfl:  .  glipiZIDE (GLUCOTROL) 10 MG tablet, TAKE 2 TABLETS BY MOUTH TWICE DAILY WITH A MEAL *TOME  DOS  TABLETAS  POR  BOCA  DOS  VECES  DIARIAS  CON  COMIDA*, Disp: 120 tablet, Rfl: 3 .  insulin NPH-regular Human (NOVOLIN 70/30 RELION) (70-30) 100  UNIT/ML injection, Inject 42 units SQ before breakfast & 40 units SQ before supper.  Inyecte 42 unidades subcutaneos antes de el desayuno y inyecte 40 unidades subcutaneos antes de la cena, Disp: 1 vial, Rfl: 4 .  lisinopril-hydrochlorothiazide (PRINZIDE,ZESTORETIC) 20-25 MG tablet, TAKE ONE TABLET BY MOUTH ONCE DAILY. TOME UNA TABLETA POR BOCA DIARIA., Disp: 30 tablet, Rfl: 4 .  metFORMIN (GLUCOPHAGE) 1000 MG tablet, TAKE 1 TABLET BY MOUTH TWICE DAILY. TOME UNA TABLETA POR BOCA DOS VECES DIARIAS CON COMIDA., Disp: 180 tablet, Rfl: 3 .  metoprolol tartrate (LOPRESSOR) 25 MG tablet, TAKE 1/2 (ONE-HALF) TABLET BY MOUTH TWICE DAILY. TOME MEDIA TABLETA POR BOCA DOS VECES DIARIAS., Disp: 30 tablet, Rfl: 3 .  naproxen (NAPROSYN) 500 MG tablet, 1 po q 12 hours prn pain.  Tome una tableta por boca cada 12 horas cuando sea necesario para el dolor, Disp: 60 tablet, Rfl: 2 .  ranitidine (ZANTAC) 300 MG tablet, 1 po qd.  Tome una tableta por boca diaria, Disp: 30 tablet, Rfl: 4 .  Sennosides (TGT NATURAL LAXATIVE PILLS) 25 MG TABS, Take 16.12 mg by mouth 2 (two) times daily. , Disp: , Rfl:  .  atorvastatin (LIPITOR) 20  MG tablet, 1 po qd.  Tome una tableta por boca diaria (Patient not taking: Reported on 05/04/2017), Disp: 90 tablet, Rfl: 3 .  fish oil-omega-3 fatty acids 1000 MG capsule, Take 2 capsules by mouth 2 (two) times daily. , Disp: , Rfl:  .  gabapentin (NEURONTIN) 300 MG capsule, 1 po bid.  Tome una tableta por boca dos veces diarias (Patient not taking: Reported on 05/04/2017), Disp: 180 capsule, Rfl: 2 .  Multiple Minerals-Vitamins (CALCIUM & VIT D3 BONE HEALTH PO), Take 1 tablet by mouth 2 (two) times daily., Disp: , Rfl:  .  nitroGLYCERIN (NITROSTAT) 0.4 MG SL tablet, Place 1 tablet under the tongue every 5 minutes as needed for chest pain.  ponga una tableta por abajo de la lengua cada 5 minutos cuando sea necesario para el dolor de pecho (Patient not taking: Reported on 05/04/2017), Disp: 25 tablet, Rfl:  3 .  simvastatin (ZOCOR) 20 MG tablet, Take 20 mg by mouth daily., Disp: , Rfl:    Review of Systems  Constitutional: Positive for fatigue. Negative for appetite change, chills, diaphoresis, fever and unexpected weight change.  HENT: Positive for dental problem, mouth sores and sneezing. Negative for congestion, drooling, ear pain, facial swelling, hearing loss, sore throat, trouble swallowing and voice change.   Eyes: Negative for pain, discharge, redness, itching and visual disturbance.  Respiratory: Negative for cough, choking, shortness of breath and wheezing.   Cardiovascular: Negative for chest pain, palpitations and leg swelling.  Gastrointestinal: Negative for abdominal pain, blood in stool, constipation, diarrhea and vomiting.  Endocrine: Positive for heat intolerance. Negative for cold intolerance and polydipsia.  Genitourinary: Negative for decreased urine volume, dysuria and hematuria.  Musculoskeletal: Positive for back pain and gait problem. Negative for arthralgias.  Skin: Negative for rash.  Allergic/Immunologic: Positive for environmental allergies.  Neurological: Positive for headaches. Negative for seizures, syncope and light-headedness.  Hematological: Negative for adenopathy.  Psychiatric/Behavioral: Positive for dysphoric mood. Negative for agitation and suicidal ideas. The patient is not nervous/anxious.     Per HPI unless specifically indicated above     Objective:    BP 116/70 (BP Location: Left Arm, Patient Position: Sitting, Cuff Size: Normal)   Pulse 77   Temp (!) 97.3 F (36.3 C) (Other (Comment))   Ht 4' 11.25" (1.505 m)   Wt 148 lb (67.1 kg)   LMP 03/05/2015   SpO2 95%   BMI 29.64 kg/m   Wt Readings from Last 3 Encounters:  05/04/17 148 lb (67.1 kg)  01/01/17 157 lb (71.2 kg)  09/24/16 156 lb 8 oz (71 kg)    Physical Exam  Constitutional: She is oriented to person, place, and time. She appears well-developed and well-nourished.  HENT:  Head:  Normocephalic and atraumatic.  Neck: Neck supple.  Cardiovascular: Normal rate and regular rhythm.  Pulmonary/Chest: Effort normal and breath sounds normal.  Abdominal: Soft. Bowel sounds are normal. She exhibits no mass. There is no hepatosplenomegaly. There is no tenderness.  Musculoskeletal: She exhibits no edema.  Lymphadenopathy:    She has no cervical adenopathy.  Neurological: She is alert and oriented to person, place, and time.  Skin: Skin is warm and dry.  Psychiatric: She has a normal mood and affect. Her behavior is normal.  Vitals reviewed.   Results for orders placed or performed in visit on 04/30/17  IFOBT POC (occult bld, rslt in office)  Result Value Ref Range   IFOBT Negative       Assessment & Plan:  Encounter Diagnoses  Name Primary?  . Type 2 diabetes mellitus with diabetic neuropathy, unspecified whether long term insulin use ([place]) Yes  . Uncontrolled type 2 diabetes mellitus with complication, with long-term current use of insulin ([place])   . Depression, unspecified depression type   . Grief   . Hyperlipidemia, unspecified hyperlipidemia type   . Essential hypertension   . Screening for breast cancer     -order screening Mammogram -counseled pt to Bring in medassist papers to get on gabapentin -Counseled on wound care -[facility] -Recommended regular walks to help dm and mood -pt to follow up 2 month.  RTO sooner prn

## 2017-06-02 ENCOUNTER — Other Ambulatory Visit: Payer: Self-pay | Admitting: Physician Assistant

## 2017-06-02 MED ORDER — NAPROXEN 500 MG PO TABS: ORAL_TABLET | ORAL | 2 refills | Status: DC

## 2017-06-02 MED ORDER — METOPROLOL TARTRATE 25 MG PO TABS: ORAL_TABLET | ORAL | 3 refills | Status: DC

## 2017-06-02 MED ORDER — METFORMIN HCL 1000 MG PO TABS: ORAL_TABLET | ORAL | 3 refills | Status: DC

## 2017-07-03 ENCOUNTER — Other Ambulatory Visit (HOSPITAL_COMMUNITY)
Admission: RE | Admit: 2017-07-03 | Discharge: 2017-07-03 | Disposition: A | Discharge status: Home / Self Care | Payer: Self-pay | Source: Ambulatory Visit | Attending: Physician Assistant | Admitting: Physician Assistant

## 2017-07-03 DIAGNOSIS — E1165 Type 2 diabetes mellitus with hyperglycemia: Secondary | ICD-10-CM | POA: Insufficient documentation

## 2017-07-03 DIAGNOSIS — IMO0002 Reserved for concepts with insufficient information to code with codable children: Secondary | ICD-10-CM

## 2017-07-03 DIAGNOSIS — I10 Essential (primary) hypertension: Secondary | ICD-10-CM | POA: Insufficient documentation

## 2017-07-03 DIAGNOSIS — E785 Hyperlipidemia, unspecified: Secondary | ICD-10-CM | POA: Insufficient documentation

## 2017-07-03 DIAGNOSIS — Z794 Long term (current) use of insulin: Secondary | ICD-10-CM | POA: Insufficient documentation

## 2017-07-03 DIAGNOSIS — E118 Type 2 diabetes mellitus with unspecified complications: Secondary | ICD-10-CM | POA: Insufficient documentation

## 2017-07-03 DIAGNOSIS — E1140 Type 2 diabetes mellitus with diabetic neuropathy, unspecified: Secondary | ICD-10-CM | POA: Insufficient documentation

## 2017-07-03 LAB — COMPREHENSIVE METABOLIC PANEL
ALT: 18 U/L (ref 14–54)
AST: 17 U/L (ref 15–41)
Albumin: 4 g/dL (ref 3.5–5.0)
Alkaline Phosphatase: 97 U/L (ref 38–126)
Anion gap: 11 (ref 5–15)
BUN: 15 mg/dL (ref 6–20)
CHLORIDE: 100 mmol/L — AB (ref 101–111)
CO2: 27 mmol/L (ref 22–32)
Calcium: 9 mg/dL (ref 8.9–10.3)
Creatinine, Ser: 0.38 mg/dL — ABNORMAL LOW (ref 0.44–1.00)
Glucose, Bld: 117 mg/dL — ABNORMAL HIGH (ref 65–99)
POTASSIUM: 3.8 mmol/L (ref 3.5–5.1)
Sodium: 138 mmol/L (ref 135–145)
TOTAL PROTEIN: 7.6 g/dL (ref 6.5–8.1)
Total Bilirubin: 0.5 mg/dL (ref 0.3–1.2)

## 2017-07-03 LAB — LIPID PANEL
CHOL/HDL RATIO: 3 ratio
Cholesterol: 109 mg/dL (ref 0–200)
HDL: 36 mg/dL — AB (ref >40)
LDL CALC: 30 mg/dL (ref 0–99)
TRIGLYCERIDES: 213 mg/dL — AB (ref <150)
VLDL: 43 mg/dL — AB (ref 0–40)

## 2017-07-04 LAB — HEMOGLOBIN A1C
Hgb A1c MFr Bld: 9.3 % — ABNORMAL HIGH (ref 4.8–5.6)
Mean Plasma Glucose: 220 mg/dL

## 2017-07-06 ENCOUNTER — Ambulatory Visit: Payer: Self-pay | Admitting: Physician Assistant

## 2017-07-06 ENCOUNTER — Encounter: Payer: Self-pay | Admitting: Physician Assistant

## 2017-07-06 VITALS — BP 132/74 | HR 71 | Temp 97.7°F | Ht 59.25 in | Wt 154.0 lb

## 2017-07-06 DIAGNOSIS — E1165 Type 2 diabetes mellitus with hyperglycemia: Secondary | ICD-10-CM

## 2017-07-06 DIAGNOSIS — Z1239 Encounter for other screening for malignant neoplasm of breast: Secondary | ICD-10-CM

## 2017-07-06 DIAGNOSIS — E1142 Type 2 diabetes mellitus with diabetic polyneuropathy: Secondary | ICD-10-CM

## 2017-07-06 DIAGNOSIS — I10 Essential (primary) hypertension: Secondary | ICD-10-CM

## 2017-07-06 DIAGNOSIS — G479 Sleep disorder, unspecified: Secondary | ICD-10-CM

## 2017-07-06 DIAGNOSIS — Z794 Long term (current) use of insulin: Principal | ICD-10-CM

## 2017-07-06 DIAGNOSIS — E118 Type 2 diabetes mellitus with unspecified complications: Principal | ICD-10-CM

## 2017-07-06 DIAGNOSIS — IMO0002 Reserved for concepts with insufficient information to code with codable children: Secondary | ICD-10-CM

## 2017-07-06 DIAGNOSIS — E785 Hyperlipidemia, unspecified: Secondary | ICD-10-CM

## 2017-07-06 DIAGNOSIS — I2510 Atherosclerotic heart disease of native coronary artery without angina pectoris: Secondary | ICD-10-CM

## 2017-07-06 MED ORDER — GABAPENTIN 300 MG PO CAPS: ORAL_CAPSULE | ORAL | 4 refills | Status: DC

## 2017-07-06 MED ORDER — NITROGLYCERIN 0.4 MG SL SUBL: SUBLINGUAL_TABLET | SUBLINGUAL | 3 refills | Status: AC

## 2017-07-06 NOTE — Progress Notes (Signed)
BP 132/74 (BP Location: Left Arm, Patient Position: Sitting, Cuff Size: Normal)   Pulse 71   Temp 97.7 F (36.5 C) (Other (Comment))   Ht 4' 11.25" (1.505 m)   Wt 154 lb (69.9 kg)   LMP 03/05/2015   SpO2 97%   BMI 30.84 kg/m    Subjective:    Patient ID: Yesenia Hoffman, female    DOB: 1965-10-01, 52 y.o.   MRN: [id number]  HPI: Yesenia Hoffman is a 52 y.o. female presenting on 07/06/2017 for Diabetes and Peripheral Neuropathy   HPI   Pt says she has been out of her NTG for some time.  She says she needed it Saturday this past weekend.  It was the only time recently she needed it.  She says her pain eventually went away.   Pt has still not brought in papers for medassist.   She was goig to be ordered atorvastatin and gabapentin   Pt states her mood is better.  She is not overwhelmed with worry today like she was at last OV  She checks her fbs sometimes at home and says it is 120-130.  She says it is never over 200.  Lowest is 112.  She says she does have a big appetite lately.   Pt says that a lot of times she feels like she can't breathe when she's sleeping.  It only happens while she is sleeping. She awakens often  She is not taking her fish oil.    Relevant past medical, surgical, family and social history reviewed and updated as indicated. Interim medical history since our last visit reviewed. Allergies and medications reviewed and updated.   Current Outpatient Medications:  .  amLODipine (NORVASC) 10 MG tablet, TAKE 1 TABLET BY MOUTH ONCE DAILY *TOME  UNA  TABLETA  POR  BOCA  DIARIA*, Disp: 30 tablet, Rfl: 3 .  aspirin 81 MG tablet, Take 1 tablet (81 mg total) by mouth daily., Disp: , Rfl:  .  fexofenadine (ALLEGRA) 180 MG tablet, Take 180 mg by mouth daily., Disp: , Rfl:  .  Flaxseed, Linseed, (FLAXSEED OIL) 1000 MG CAPS, Take 2 capsules by mouth 2 (two) times daily., Disp: , Rfl:  .  glipiZIDE (GLUCOTROL) 10 MG tablet, TAKE 2 TABLETS BY MOUTH TWICE DAILY WITH A MEAL  *TOME  DOS  TABLETAS  POR  BOCA  DOS  VECES  DIARIAS  CON  COMIDA*, Disp: 120 tablet, Rfl: 3 .  insulin NPH-regular Human (NOVOLIN 70/30 RELION) (70-30) 100 UNIT/ML injection, Inject 42 units SQ before breakfast & 40 units SQ before supper.  Inyecte 42 unidades subcutaneos antes de el desayuno y inyecte 40 unidades subcutaneos antes de la cena, Disp: 1 vial, Rfl: 4 .  lisinopril-hydrochlorothiazide (PRINZIDE,ZESTORETIC) 20-25 MG tablet, TAKE ONE TABLET BY MOUTH ONCE DAILY. TOME UNA TABLETA POR BOCA DIARIA., Disp: 30 tablet, Rfl: 4 .  metFORMIN (GLUCOPHAGE) 1000 MG tablet, TAKE 1 TABLET BY MOUTH TWICE DAILY. TOME UNA TABLETA POR BOCA DOS VECES DIARIAS CON COMIDA., Disp: 60 tablet, Rfl: 3 .  metoprolol tartrate (LOPRESSOR) 25 MG tablet, TAKE 1/2 (ONE-HALF) TABLET BY MOUTH TWICE DAILY. TOME MEDIA TABLETA POR BOCA DOS VECES DIARIAS., Disp: 30 tablet, Rfl: 3 .  naproxen (NAPROSYN) 500 MG tablet, 1 po q 12 hours prn pain.  Tome una tableta por boca cada 12 horas cuando sea necesario para el dolor, Disp: 60 tablet, Rfl: 2 .  ranitidine (ZANTAC) 300 MG tablet, 1 po qd.  Tome una tableta  por boca diaria, Disp: 30 tablet, Rfl: 4 .  Sennosides (TGT NATURAL LAXATIVE PILLS) 25 MG TABS, Take 16.12 mg by mouth 2 (two) times daily. , Disp: , Rfl:  .  simvastatin (ZOCOR) 20 MG tablet, TAKE 1 TABLET BY MOUTH AT BEDTIME TOME  UNA  TABLETA  POR LA  BOCA  AL  DORMIR, Disp: 30 tablet, Rfl: 1 .  fish oil-omega-3 fatty acids 1000 MG capsule, Take 2 capsules by mouth 2 (two) times daily. , Disp: , Rfl:  .  gabapentin (NEURONTIN) 300 MG capsule, 1 po bid.  Tome una tableta por boca dos veces diarias (Patient not taking: Reported on 05/04/2017), Disp: 180 capsule, Rfl: 2 .  nitroGLYCERIN (NITROSTAT) 0.4 MG SL tablet, Place 1 tablet under the tongue every 5 minutes as needed for chest pain.  ponga una tableta por abajo de la lengua cada 5 minutos cuando sea necesario para el dolor de pecho (Patient not taking: Reported on 05/04/2017),  Disp: 25 tablet, Rfl: 3   Review of Systems  Constitutional: Positive for appetite change, fatigue and unexpected weight change. Negative for chills, diaphoresis and fever.  HENT: Positive for dental problem and mouth sores. Negative for congestion, drooling, ear pain, facial swelling, hearing loss, sneezing, sore throat, trouble swallowing and voice change.   Eyes: Positive for pain. Negative for discharge, redness, itching and visual disturbance.  Respiratory: Positive for shortness of breath. Negative for cough, choking and wheezing.   Cardiovascular: Positive for leg swelling. Negative for chest pain and palpitations.  Gastrointestinal: Negative for abdominal pain, blood in stool, constipation, diarrhea and vomiting.  Endocrine: Negative for cold intolerance, heat intolerance and polydipsia.  Genitourinary: Negative for decreased urine volume, dysuria and hematuria.  Musculoskeletal: Positive for arthralgias. Negative for back pain and gait problem.  Skin: Negative for rash.  Allergic/Immunologic: Positive for environmental allergies.  Neurological: Positive for headaches. Negative for seizures, syncope and light-headedness.  Hematological: Negative for adenopathy.  Psychiatric/Behavioral: Negative for agitation, dysphoric mood and suicidal ideas. The patient is nervous/anxious.     Per HPI unless specifically indicated above     Objective:    BP 132/74 (BP Location: Left Arm, Patient Position: Sitting, Cuff Size: Normal)   Pulse 71   Temp 97.7 F (36.5 C) (Other (Comment))   Ht 4' 11.25" (1.505 m)   Wt 154 lb (69.9 kg)   LMP 03/05/2015   SpO2 97%   BMI 30.84 kg/m   Wt Readings from Last 3 Encounters:  07/06/17 154 lb (69.9 kg)  05/04/17 148 lb (67.1 kg)  01/01/17 157 lb (71.2 kg)    Physical Exam  Constitutional: She is oriented to person, place, and time. She appears well-developed and well-nourished.  HENT:  Head: Normocephalic and atraumatic.  Neck: Neck supple.   Cardiovascular: Normal rate and regular rhythm.  Pulmonary/Chest: Effort normal and breath sounds normal.  Abdominal: Soft. Bowel sounds are normal. She exhibits no mass. There is no hepatosplenomegaly. There is no tenderness.  Musculoskeletal: She exhibits no edema.  Lymphadenopathy:    She has no cervical adenopathy.  Neurological: She is alert and oriented to person, place, and time.  Skin: Skin is warm and dry.  Psychiatric: She has a normal mood and affect. Her behavior is normal.  Vitals reviewed.   Results for orders placed or performed during the hospital encounter of 07/03/17  Hemoglobin A1c  Result Value Ref Range   Hgb A1c MFr Bld 9.3 (H) 4.8 - 5.6 %   Mean Plasma Glucose 220  mg/dL  Lipid panel  Result Value Ref Range   Cholesterol 109 0 - 200 mg/dL   Triglycerides 213 (H) <150 mg/dL   HDL 36 (L) >40 mg/dL   Total CHOL/HDL Ratio 3.0 RATIO   VLDL 43 (H) 0 - 40 mg/dL   LDL Cholesterol 30 0 - 99 mg/dL  Comprehensive metabolic panel  Result Value Ref Range   Sodium 138 135 - 145 mmol/L   Potassium 3.8 3.5 - 5.1 mmol/L   Chloride 100 (L) 101 - 111 mmol/L   CO2 27 22 - 32 mmol/L   Glucose, Bld 117 (H) 65 - 99 mg/dL   BUN 15 6 - 20 mg/dL   Creatinine, Ser 0.38 (L) 0.44 - 1.00 mg/dL   Calcium 9.0 8.9 - 10.3 mg/dL   Total Protein 7.6 6.5 - 8.1 g/dL   Albumin 4.0 3.5 - 5.0 g/dL   AST 17 15 - 41 U/L   ALT 18 14 - 54 U/L   Alkaline Phosphatase 97 38 - 126 U/L   Total Bilirubin 0.5 0.3 - 1.2 mg/dL   GFR calc non Af Amer >60 >60 mL/min   GFR calc Af Amer >60 >60 mL/min   Anion gap 11 5 - 15      Assessment & Plan:   Encounter Diagnoses  Name Primary?  Marland Kitchen Uncontrolled type 2 diabetes mellitus with complication, with long-term current use of insulin ([place]) Yes  . Essential hypertension   . Hyperlipidemia, unspecified hyperlipidemia type   . Coronary artery disease involving native coronary artery of native heart without angina pectoris   . Screening for breast cancer    . Diabetic polyneuropathy associated with type 2 diabetes mellitus ([place])   . Sleep disturbance     -reviewed labs with pt  -Pt reminded to call office for fbs > 300.  Counseled pt to watch diet and exercise or diabetes.    -She counseled to get back on fish oil  -screening Mammogram  - she says someone called that only spoke eglish.  Will re-request and ask that they have spanish speaker call her  -Pt to bring in papers for medassist  -pt is given rx gabapentin and coupon  -will order Sleep study  -pt is given [facility]  -pt to follow up 3 months.  RTO sooner prn

## 2017-07-10 ENCOUNTER — Other Ambulatory Visit: Payer: Self-pay | Admitting: Physician Assistant

## 2017-07-10 DIAGNOSIS — Z1231 Encounter for screening mammogram for malignant neoplasm of breast: Secondary | ICD-10-CM

## 2017-07-26 ENCOUNTER — Other Ambulatory Visit: Payer: Self-pay | Admitting: Physician Assistant

## 2017-08-26 ENCOUNTER — Ambulatory Visit: Payer: Self-pay | Admitting: Neurology

## 2017-08-26 ENCOUNTER — Encounter: Payer: Self-pay | Admitting: Neurology

## 2017-08-26 VITALS — BP 138/76 | HR 76 | Ht 60.0 in | Wt 154.0 lb

## 2017-08-26 DIAGNOSIS — R351 Nocturia: Secondary | ICD-10-CM

## 2017-08-26 DIAGNOSIS — R0681 Apnea, not elsewhere classified: Secondary | ICD-10-CM

## 2017-08-26 DIAGNOSIS — E669 Obesity, unspecified: Secondary | ICD-10-CM

## 2017-08-26 DIAGNOSIS — G4719 Other hypersomnia: Secondary | ICD-10-CM

## 2017-08-26 DIAGNOSIS — R519 Headache, unspecified: Secondary | ICD-10-CM

## 2017-08-26 DIAGNOSIS — R0683 Snoring: Secondary | ICD-10-CM

## 2017-08-26 DIAGNOSIS — Z955 Presence of coronary angioplasty implant and graft: Secondary | ICD-10-CM

## 2017-08-26 DIAGNOSIS — R51 Headache: Secondary | ICD-10-CM

## 2017-08-26 NOTE — Progress Notes (Signed)
Subjective:    Patient ID: Yesenia Hoffman is a 52 y.o. female.  HPI     Star Age, MD, PhD [facility] [address], [address].O. [place],  [address]  Dear Larene Beach,   I saw your patient, upon your kind request, in my neurologic clinic today for initial consultation of her sleep disorder, in particular, concern for underlying obstructive sleep apnea. The patient is accompanied by her son and an interpreter today. As you know, Yesenia Hoffman a 52 year old right-handed woman with an underlying medical history of hypertension, reflux disease, coronary artery disease with s/p stent placement, type 2 diabetes, hyperlipidemia, anemia and obesity, who reports snoring and daytime somnolence as well as difficulty breathing at night. I reviewed your office note from 07/06/2017. Her Epworth sleepiness score is 4 out of 24 today, fatigue score is 39 out of 63. She is divorced and lives with friends. She has 2 children. She does not smoke, drinks alcohol occasionally, drinks no daily caffeine, mostly water.  She does not have set a sleep schedule. She works at times till 2 AM. She goes to bed between MN to 3 AM. She lost her BF last year, he was sick for months and she took care of him, was in the hospital with him, day and night and since then her sleep has been interrupted and very erratic. She has a variable work schedule, usually around 3:30 PM. She has no FHx of OSA, but suspects, that mom may have had it. She has occasional AM HAs, nocturia about 2/night.   Her Past Medical History Is Significant For: Past Medical History:  Diagnosis Date  . Anemia   . CAD (coronary artery disease) 07/2013   stent placed  . GERD (gastroesophageal reflux disease)   . Migraine   . Obesity, unspecified   . Other and unspecified hyperlipidemia   . Type II or unspecified type diabetes mellitus without mention of complication, uncontrolled   . Unspecified essential hypertension      Her Past Surgical History Is Significant For: Past Surgical History:  Procedure Laterality Date  . CARDIAC CATHETERIZATION    . [place]    Her Family History Is Significant For: Family History  Problem Relation Age of Onset  . Diabetes Mother   . Hypertension Mother   . Cancer Sister   . Cancer Sister     Her Social History Is Significant For: Social History   Socioeconomic History  . Marital status: Legally Separated    Spouse name: Not on file  . Number of children: Not on file  . Years of education: Not on file  . Highest education level: Not on file  Occupational History  . Not on file  Social Needs  . Financial resource strain: Not on file  . Food insecurity:    Worry: Not on file    Inability: Not on file  . Transportation needs:    Medical: Not on file    Non-medical: Not on file  Tobacco Use  . Smoking status: Never Smoker  . Smokeless tobacco: Never Used  Substance and Sexual Activity  . Alcohol use: Yes    Alcohol/week: 0.0 oz  . Drug use: No  . Sexual activity: Yes    Birth control/protection: Post-menopausal  Lifestyle  . Physical activity:    Days per week: Not on file    Minutes per session: Not on file  . Stress: Not on file  Relationships  .  Social connections:    Talks on phone: Not on file    Gets together: Not on file    Attends religious service: Not on file    Active member of club or organization: Not on file    Attends meetings of clubs or organizations: Not on file    Relationship status: Not on file  Other Topics Concern  . Not on file  Social History Narrative  . Not on file    Her Allergies Are:  Allergies  Allergen Reactions  . Pollen Extract Other (See Comments)    Congestion   :   Her Current Medications Are:  Outpatient Encounter Medications as of 08/26/2017  Medication Sig  . amLODipine (NORVASC) 10 MG tablet TAKE 1 TABLET BY MOUTH ONCE DAILY *TOME  UNA  TABLETA  POR  BOCA  DIARIA*  .  aspirin 81 MG tablet Take 1 tablet (81 mg total) by mouth daily.  . fexofenadine (ALLEGRA) 180 MG tablet Take 180 mg by mouth daily.  . fish oil-omega-3 fatty acids 1000 MG capsule Take 2 capsules by mouth 2 (two) times daily.   . Flaxseed, Linseed, (FLAXSEED OIL) 1000 MG CAPS Take 2 capsules by mouth 2 (two) times daily.  Marland Kitchen gabapentin (NEURONTIN) 300 MG capsule 1 po bid.  Tome una tableta por [employer] veces diarias  . glipiZIDE (GLUCOTROL) 10 MG tablet TAKE 2 TABLETS BY MOUTH TWICE DAILY WITH A MEAL *TOME  DOS  TABLETAS  POR  BOCA  DOS  VECES  DIARIAS  CON  COMIDA*  . insulin NPH-regular Human (NOVOLIN 70/30 RELION) (70-30) 100 UNIT/ML injection Inject 42 units SQ before breakfast & 40 units SQ before supper.  Inyecte 42 unidades subcutaneos antes de el desayuno y inyecte 40 unidades subcutaneos antes de la [place]  . lisinopril-hydrochlorothiazide (PRINZIDE,ZESTORETIC) 20-25 MG tablet TAKE 1 TABLET BY MOUTH ONCE DAILY. TOME UNA TABLETA POR BOCA DIARIA  . metFORMIN (GLUCOPHAGE) 1000 MG tablet TAKE 1 TABLET BY MOUTH TWICE DAILY. TOME UNA TABLETA POR BOCA DOS VECES DIARIAS CON COMIDA.  Marland Kitchen metoprolol tartrate (LOPRESSOR) 25 MG tablet TAKE 1/2 (ONE-HALF) TABLET BY MOUTH TWICE DAILY. TOME MEDIA TABLETA POR BOCA DOS VECES DIARIAS.  . naproxen (NAPROSYN) 500 MG tablet 1 po q 12 hours prn pain.  Tome una tableta por boca cada 12 horas cuando sea necesario para el dolor  . nitroGLYCERIN (NITROSTAT) 0.4 MG SL tablet Place 1 tablet under the tongue every 5 minutes as needed for chest pain.  ponga una tableta por abajo de la lengua cada 5 minutos cuando sea necesario para el dolor de pecho  . ranitidine (ZANTAC) 300 MG tablet 1 po qd.  Tome una tableta por boca diaria  . Sennosides (TGT NATURAL LAXATIVE PILLS) 25 MG TABS Take 16.12 mg by mouth 2 (two) times daily.   . simvastatin (ZOCOR) 20 MG tablet TAKE 1 TABLET BY MOUTH AT BEDTIME. TOME UNA TABLETA POR LA BOCA AL DORMIR   No facility-administered encounter  medications on file as of 08/26/2017.   :  Review of Systems:  Out of a complete 14 point review of systems, all are reviewed and negative with the exception of these symptoms as listed below: Review of Systems  Neurological:       Pt presents today to discuss her sleep. Pt has never had a sleep study but does endorse snoring.  Epworth Sleepiness Scale 0= would never doze 1= slight chance of dozing 2= moderate chance of dozing 3= high chance of dozing  Sitting and reading: 0 Watching TV: 0 Sitting inactive in a public place (ex. Theater or meeting): 0 As a passenger in a car for an hour without a break: 2 Lying down to rest in the afternoon: 0 Sitting and talking to someone: 0 Sitting quietly after lunch (no alcohol): 2 In a car, while stopped in traffic: 0 Total: 4     Objective:  Neurological Exam  Physical Exam Physical Examination:   Vitals:   08/26/17 1533  BP: 138/76  Pulse: 76    General Examination: The patient is a very pleasant 52 y.o. female in no acute distress. She appears well-developed and well-nourished and well groomed.   HEENT: Normocephalic, atraumatic, pupils are equal, round and reactive to light and accommodation. Corrective eye glasses in place. Extraocular tracking is good without limitation to gaze excursion or nystagmus noted. Normal smooth pursuit is noted. Hearing is grossly intact. Face is symmetric with normal facial animation and normal facial sensation. Speech is clear with no dysarthria noted. There is no hypophonia. There is no lip, neck/head, jaw or voice tremor. Neck is supple with full range of passive and active motion. There are no carotid bruits on auscultation. Oropharynx exam reveals: mild mouth dryness, adequate dental hygiene and moderate airway crowding, due to larger uvula and tonsils in place of 2+, smaller airway entry. Mallampati is class III. Tongue protrudes centrally and palate elevates symmetrically. Neck size is 15 inches.    Chest: Clear to auscultation without wheezing, rhonchi or crackles noted.  Heart: S1+S2+0, regular and normal without murmurs, rubs or gallops noted.   Abdomen: Soft, non-tender and non-distended with normal bowel sounds appreciated on auscultation.  Extremities: There is trace pitting edema in the distal lower extremities bilaterally. Pedal pulses are intact.  Skin: Warm and dry without trophic changes noted.  Musculoskeletal: exam reveals no obvious joint deformities, tenderness or joint swelling or erythema.   Neurologically:  Mental status: The patient is awake, alert and oriented in all 4 spheres. Her immediate and remote memory, attention, language skills and fund of knowledge are appropriate. There is no evidence of aphasia, agnosia, apraxia or anomia. Speech is clear with normal prosody and enunciation. Thought process is linear. Mood is normal and affect is normal. Tearful, when talking about deceased BF.   Cranial nerves II - XII are as described above under HEENT exam. In addition: shoulder shrug is normal with equal shoulder height noted. Motor exam: Normal bulk, strength and tone is noted. There is no drift, tremor or rebound. Reflexes are 1+ throughout. Fine motor skills and coordination: grossly intact.  Cerebellar testing: No dysmetria or intention tremor. There is no truncal or gait ataxia.  Sensory exam: intact to light touch in the upper and lower extremities.  Gait, station and balance: She stands easily. No veering to one side is noted. No leaning to one side is noted. Posture is age-appropriate and stance is narrow based. Gait shows normal stride length and normal pace. No problems turning are noted. Tandem walk is unremarkable. Intact toe and heel stance is noted.               Assessment and Plan:  In summary, Annsley M Szymanowski is a very pleasant 52 y.o.-year old female with an underlying medical history of hypertension, reflux disease, coronary artery disease with s/p  stent placement, type 2 diabetes, hyperlipidemia, anemia and obesity, whose history and physical exam are concerning for obstructive sleep apnea (OSA). I had a long chat with the patient  and her son about my findings and the diagnosis of OSA, its prognosis and treatment options. We talked about medical treatments, surgical interventions and non-pharmacological approaches. I explained in particular the risks and ramifications of untreated moderate to severe OSA, especially with respect to developing cardiovascular disease down the Road, including congestive heart failure, difficult to treat hypertension, cardiac arrhythmias, or stroke. Even type 2 diabetes has, in part, been linked to untreated OSA. Symptoms of untreated OSA include daytime sleepiness, memory problems, mood irritability and mood disorder such as depression and anxiety, lack of energy, as well as recurrent headaches, especially morning headaches. We talked about trying to maintain a healthy lifestyle in general, as well as the importance of weight control. I encouraged the patient to eat healthy, exercise daily and keep well hydrated, to keep a scheduled bedtime and wake time routine, to not skip any meals and eat healthy snacks in between meals. I advised the patient not to drive when feeling sleepy. I recommended the following at this time: sleep study with potential positive airway pressure titration. (We will score hypopneas at 3%).   I explained the sleep test procedure to the patient and explained the CPAP treatment option to the patient, who indicated that she would be willing to try CPAP if the need arises. I explained the importance of being compliant with PAP treatment, not only for insurance purposes but primarily to improve Her symptoms, and for the patient's long term health benefit, including to reduce Her cardiovascular risks. I answered all her questions today and the patient was in agreement. I would like to see her back after  the sleep study is completed and encouraged her to call with any interim questions, concerns, problems or updates.   Thank you very much for allowing me to participate in the care of this nice patient. If I can be of any further assistance to you please do not hesitate to call me at [phone].  Sincerely,   Star Age, MD, PhD

## 2017-08-26 NOTE — Patient Instructions (Addendum)

## 2017-08-30 ENCOUNTER — Other Ambulatory Visit: Payer: Self-pay | Admitting: Physician Assistant

## 2017-09-02 ENCOUNTER — Ambulatory Visit: Payer: Self-pay | Admitting: Physician Assistant

## 2017-09-02 ENCOUNTER — Encounter: Payer: Self-pay | Admitting: Physician Assistant

## 2017-09-02 VITALS — BP 130/66 | HR 83 | Temp 98.0°F | Ht 60.0 in | Wt 156.5 lb

## 2017-09-02 DIAGNOSIS — J00 Acute nasopharyngitis [common cold]: Secondary | ICD-10-CM

## 2017-09-02 DIAGNOSIS — R05 Cough: Secondary | ICD-10-CM

## 2017-09-02 DIAGNOSIS — R059 Cough, unspecified: Secondary | ICD-10-CM

## 2017-09-02 MED ORDER — PROMETHAZINE-DM 6.25-15 MG/5ML PO SYRP: ORAL_SOLUTION | ORAL | 0 refills | Status: DC

## 2017-09-02 MED ORDER — BENZONATATE 100 MG PO CAPS: ORAL_CAPSULE | ORAL | 3 refills | Status: DC

## 2017-09-02 NOTE — Progress Notes (Signed)
BP 130/66 (BP Location: Left Arm, Patient Position: Sitting, Cuff Size: Normal)   Pulse 83   Temp 98 F (36.7 C) (Oral)   Ht 5' (1.524 m)   Wt 156 lb 8 oz (71 kg)   LMP 03/05/2015   SpO2 97%   BMI 30.56 kg/m    Subjective:    Patient ID: Yesenia Hoffman, female    DOB: Oct 26, 1965, 52 y.o.   MRN: [id number]  HPI: Yesenia Hoffman is a 52 y.o. female presenting on 09/02/2017 for Cough (cough began on Monday. pt states sore throat and allergy symptoms began on saturday. pt states she has taken mucinex and robitussin, but pt states only helps for 2-3 hours.)   HPI   Chief Complaint  Patient presents with  . Cough    cough began on Monday. pt states sore throat and allergy symptoms began on saturday. pt states she has taken mucinex and robitussin, but pt states only helps for 2-3 hours.     Pt states bs < 200 lately.  Pt went to [facility] Saturday before last (April 29)  for ST.  She was given rx for throat spray.  She says the worst thing is the cough.  No sob or wheezing.   Relevant past medical, surgical, family and social history reviewed and updated as indicated. Interim medical history since our last visit reviewed. Allergies and medications reviewed and updated.    Current Outpatient Medications:  .  amLODipine (NORVASC) 10 MG tablet, TAKE 1 TABLET BY MOUTH ONCE DAILY TOME  UNA  TABLETA  POR  BOCA  DIARIA, Disp: 30 tablet, Rfl: 3 .  aspirin 81 MG tablet, Take 1 tablet (81 mg total) by mouth daily., Disp: , Rfl:  .  fexofenadine (ALLEGRA) 180 MG tablet, Take 180 mg by mouth daily., Disp: , Rfl:  .  fish oil-omega-3 fatty acids 1000 MG capsule, Take 2 capsules by mouth 2 (two) times daily. , Disp: , Rfl:  .  gabapentin (NEURONTIN) 300 MG capsule, 1 po bid.  Tome una tableta por boca dos veces diarias, Disp: 60 capsule, Rfl: 4 .  glipiZIDE (GLUCOTROL) 10 MG tablet, TAKE 2 TABLETS BY MOUTH TWICE DAILY WITH  A  MEAL  TOME  DOS  TABLETAS  POR  BOCA  DOS  VECES  DIARIAS  CON  COMIDA,  Disp: 120 tablet, Rfl: 3 .  insulin NPH-regular Human (NOVOLIN 70/30 RELION) (70-30) 100 UNIT/ML injection, Inject 42 units SQ before breakfast & 40 units SQ before supper.  Inyecte 42 unidades subcutaneos antes de el desayuno y inyecte 40 unidades subcutaneos antes de la cena, Disp: 1 vial, Rfl: 4 .  lisinopril-hydrochlorothiazide (PRINZIDE,ZESTORETIC) 20-25 MG tablet, TAKE 1 TABLET BY MOUTH ONCE DAILY. TOME UNA TABLETA POR BOCA DIARIA, Disp: 30 tablet, Rfl: 4 .  metFORMIN (GLUCOPHAGE) 1000 MG tablet, TAKE 1 TABLET BY MOUTH TWICE DAILY. TOME UNA TABLETA POR BOCA DOS VECES DIARIAS CON COMIDA., Disp: 60 tablet, Rfl: 3 .  metoprolol tartrate (LOPRESSOR) 25 MG tablet, TAKE 1/2 (ONE-HALF) TABLET BY MOUTH TWICE DAILY. TOME MEDIA TABLETA POR BOCA DOS VECES DIARIAS., Disp: 30 tablet, Rfl: 3 .  naproxen (NAPROSYN) 500 MG tablet, 1 po q 12 hours prn pain.  Tome una tableta por boca cada 12 horas cuando sea necesario para el dolor, Disp: 60 tablet, Rfl: 2 .  nitroGLYCERIN (NITROSTAT) 0.4 MG SL tablet, Place 1 tablet under the tongue every 5 minutes as needed for chest pain.  ponga una tableta por abajo  de la lengua cada 5 minutos cuando sea necesario para el dolor de pecho, Disp: 25 tablet, Rfl: 3 .  ranitidine (ZANTAC) 300 MG tablet, 1 po qd.  Tome una tableta por boca diaria, Disp: 30 tablet, Rfl: 4 .  Sennosides (TGT NATURAL LAXATIVE PILLS) 25 MG TABS, Take 16.12 mg by mouth 2 (two) times daily. , Disp: , Rfl:  .  simvastatin (ZOCOR) 20 MG tablet, TAKE 1 TABLET BY MOUTH AT BEDTIME. TOME UNA TABLETA POR LA BOCA AL DORMIR, Disp: 30 tablet, Rfl: 4 .  Flaxseed, Linseed, (FLAXSEED OIL) 1000 MG CAPS, Take 2 capsules by mouth 2 (two) times daily., Disp: , Rfl:    Review of Systems  Constitutional: Positive for chills, diaphoresis and fatigue. Negative for appetite change, fever and unexpected weight change.  HENT: Positive for congestion, dental problem, sneezing and sore throat. Negative for drooling, ear pain,  facial swelling, hearing loss, mouth sores, trouble swallowing and voice change.   Eyes: Negative for pain, discharge, redness, itching and visual disturbance.  Respiratory: Positive for cough. Negative for choking, shortness of breath and wheezing.   Cardiovascular: Positive for chest pain (when coughs). Negative for palpitations and leg swelling.  Gastrointestinal: Negative for abdominal pain, blood in stool, constipation, diarrhea and vomiting.  Endocrine: Negative for cold intolerance, heat intolerance and polydipsia.  Genitourinary: Negative for decreased urine volume, dysuria and hematuria.  Musculoskeletal: Negative for arthralgias, back pain and gait problem.  Skin: Negative for rash.  Allergic/Immunologic: Negative for environmental allergies.  Neurological: Negative for seizures, syncope, light-headedness and headaches.  Hematological: Negative for adenopathy.  Psychiatric/Behavioral: Negative for agitation, dysphoric mood and suicidal ideas. The patient is not nervous/anxious.     Per HPI unless specifically indicated above     Objective:    BP 130/66 (BP Location: Left Arm, Patient Position: Sitting, Cuff Size: Normal)   Pulse 83   Temp 98 F (36.7 C) (Oral)   Ht 5' (1.524 m)   Wt 156 lb 8 oz (71 kg)   LMP 03/05/2015   SpO2 97%   BMI 30.56 kg/m   Wt Readings from Last 3 Encounters:  09/02/17 156 lb 8 oz (71 kg)  08/26/17 154 lb (69.9 kg)  07/06/17 154 lb (69.9 kg)    Physical Exam  Constitutional: She is oriented to person, place, and time. She appears well-developed and well-nourished.  HENT:  Head: Normocephalic and atraumatic.  Right Ear: Hearing, tympanic membrane, external ear and ear canal normal.  Left Ear: Hearing, tympanic membrane, external ear and ear canal normal.  Nose: Nose normal.  Mouth/Throat: Uvula is midline and oropharynx is clear and moist. No oropharyngeal exudate.  Neck: Neck supple.  Cardiovascular: Normal rate and regular rhythm.   Pulmonary/Chest: Effort normal and breath sounds normal. She has no wheezes.  Abdominal: Soft. Bowel sounds are normal. She exhibits no mass. There is no hepatosplenomegaly. There is no tenderness.  Musculoskeletal: She exhibits no edema.  Lymphadenopathy:    She has no cervical adenopathy.  Neurological: She is alert and oriented to person, place, and time.  Skin: Skin is warm and dry.  Psychiatric: She has a normal mood and affect. Her behavior is normal.  Vitals reviewed.        Assessment & Plan:     Encounter Diagnoses  Name Primary?  . Acute nasopharyngitis Yes  . Cough    -Pt given rx for tessalon and promethazine dm.  Pt counseled to avoid driving on the latter due to sedation.  -pt  to rest, get plenty of fluids. -RTO if not improving by Monday or sooner for worsening or new symptoms

## 2017-10-04 ENCOUNTER — Other Ambulatory Visit: Payer: Self-pay | Admitting: Physician Assistant

## 2017-10-05 ENCOUNTER — Ambulatory Visit: Payer: Self-pay | Admitting: Physician Assistant

## 2017-11-02 ENCOUNTER — Other Ambulatory Visit: Payer: Self-pay | Admitting: Physician Assistant

## 2017-11-02 DIAGNOSIS — E118 Type 2 diabetes mellitus with unspecified complications: Secondary | ICD-10-CM

## 2017-11-02 DIAGNOSIS — IMO0002 Reserved for concepts with insufficient information to code with codable children: Secondary | ICD-10-CM

## 2017-11-02 DIAGNOSIS — Z794 Long term (current) use of insulin: Secondary | ICD-10-CM

## 2017-11-02 DIAGNOSIS — I2510 Atherosclerotic heart disease of native coronary artery without angina pectoris: Secondary | ICD-10-CM

## 2017-11-02 DIAGNOSIS — I10 Essential (primary) hypertension: Secondary | ICD-10-CM

## 2017-11-02 DIAGNOSIS — E1165 Type 2 diabetes mellitus with hyperglycemia: Secondary | ICD-10-CM

## 2017-11-02 DIAGNOSIS — E785 Hyperlipidemia, unspecified: Secondary | ICD-10-CM

## 2017-11-06 ENCOUNTER — Other Ambulatory Visit (HOSPITAL_COMMUNITY)
Admission: RE | Admit: 2017-11-06 | Discharge: 2017-11-06 | Disposition: A | Discharge status: Home / Self Care | Payer: Self-pay | Source: Ambulatory Visit | Attending: Physician Assistant | Admitting: Physician Assistant

## 2017-11-06 DIAGNOSIS — E1165 Type 2 diabetes mellitus with hyperglycemia: Secondary | ICD-10-CM | POA: Insufficient documentation

## 2017-11-06 DIAGNOSIS — Z794 Long term (current) use of insulin: Secondary | ICD-10-CM | POA: Insufficient documentation

## 2017-11-06 DIAGNOSIS — IMO0002 Reserved for concepts with insufficient information to code with codable children: Secondary | ICD-10-CM

## 2017-11-06 DIAGNOSIS — I10 Essential (primary) hypertension: Secondary | ICD-10-CM | POA: Insufficient documentation

## 2017-11-06 DIAGNOSIS — E785 Hyperlipidemia, unspecified: Secondary | ICD-10-CM | POA: Insufficient documentation

## 2017-11-06 DIAGNOSIS — I2510 Atherosclerotic heart disease of native coronary artery without angina pectoris: Secondary | ICD-10-CM | POA: Insufficient documentation

## 2017-11-06 DIAGNOSIS — E118 Type 2 diabetes mellitus with unspecified complications: Secondary | ICD-10-CM | POA: Insufficient documentation

## 2017-11-06 LAB — LIPID PANEL
Cholesterol: 113 mg/dL (ref 0–200)
HDL: 30 mg/dL — AB (ref >40)
LDL CALC: 64 mg/dL (ref 0–99)
TRIGLYCERIDES: 94 mg/dL (ref <150)
Total CHOL/HDL Ratio: 3.8 RATIO
VLDL: 19 mg/dL (ref 0–40)

## 2017-11-06 LAB — COMPREHENSIVE METABOLIC PANEL
ALK PHOS: 87 U/L (ref 38–126)
ALT: 18 U/L (ref 0–44)
AST: 19 U/L (ref 15–41)
Albumin: 3.8 g/dL (ref 3.5–5.0)
Anion gap: 7 (ref 5–15)
BUN: 16 mg/dL (ref 6–20)
CO2: 28 mmol/L (ref 22–32)
Calcium: 8.8 mg/dL — ABNORMAL LOW (ref 8.9–10.3)
Chloride: 101 mmol/L (ref 98–111)
Creatinine, Ser: 0.45 mg/dL (ref 0.44–1.00)
GFR calc Af Amer: >60 mL/min (ref >60)
GLUCOSE: 211 mg/dL — AB (ref 70–99)
Potassium: 4.1 mmol/L (ref 3.5–5.1)
SODIUM: 136 mmol/L (ref 135–145)
TOTAL PROTEIN: 7.5 g/dL (ref 6.5–8.1)
Total Bilirubin: 0.5 mg/dL (ref 0.3–1.2)

## 2017-11-06 LAB — HEMOGLOBIN A1C
HEMOGLOBIN A1C: 9.5 % — AB (ref 4.8–5.6)
MEAN PLASMA GLUCOSE: 225.95 mg/dL

## 2017-11-09 ENCOUNTER — Ambulatory Visit: Payer: Self-pay | Admitting: Physician Assistant

## 2017-11-09 ENCOUNTER — Encounter: Payer: Self-pay | Admitting: Physician Assistant

## 2017-11-09 VITALS — BP 132/81 | HR 65 | Temp 97.5°F | Ht 60.0 in | Wt 151.8 lb

## 2017-11-09 DIAGNOSIS — I10 Essential (primary) hypertension: Secondary | ICD-10-CM

## 2017-11-09 DIAGNOSIS — E1165 Type 2 diabetes mellitus with hyperglycemia: Principal | ICD-10-CM

## 2017-11-09 DIAGNOSIS — E118 Type 2 diabetes mellitus with unspecified complications: Secondary | ICD-10-CM

## 2017-11-09 DIAGNOSIS — I2510 Atherosclerotic heart disease of native coronary artery without angina pectoris: Secondary | ICD-10-CM

## 2017-11-09 DIAGNOSIS — E1142 Type 2 diabetes mellitus with diabetic polyneuropathy: Secondary | ICD-10-CM

## 2017-11-09 DIAGNOSIS — Z794 Long term (current) use of insulin: Principal | ICD-10-CM

## 2017-11-09 DIAGNOSIS — IMO0002 Reserved for concepts with insufficient information to code with codable children: Secondary | ICD-10-CM

## 2017-11-09 DIAGNOSIS — E785 Hyperlipidemia, unspecified: Secondary | ICD-10-CM

## 2017-11-09 DIAGNOSIS — Z1239 Encounter for other screening for malignant neoplasm of breast: Secondary | ICD-10-CM

## 2017-11-09 NOTE — Patient Instructions (Addendum)
[facility] [phone] Call and notify them that you are a patient of the [facility] clinic and you were told to call to get an appointment for a screening mammogram.  Llame y dejeles sabes que es un [facility] y que se le fue dicho que llamara para conseguir una cita para la mammografia de evaluacion.  llame la oficina del Dr. Josiah Lobo hacer su cita para el estudio de dormir Call Dr Guadelupe Sabin office to schedule sleep test

## 2017-11-09 NOTE — Progress Notes (Signed)
BP 132/81 (BP Location: Right Arm, Patient Position: Sitting, Cuff Size: Normal)   Pulse 65   Temp (!) 97.5 F (36.4 C)   Ht 5' (1.524 m)   Wt 151 lb 12 oz (68.8 kg)   LMP 03/05/2015   SpO2 96%   BMI 29.64 kg/m    Subjective:    Patient ID: Yesenia Hoffman, female    DOB: 01/25/1966, 52 y.o.   MRN: [id number]  HPI: Yesenia Hoffman is a 52 y.o. female presenting on 11/09/2017 for Diabetes; Hyperlipidemia; and Hypertension   HPI  She occasionally checks her bs-   fbs runs 180-215 She says where she works at makes her bs get off track.   She says she gets home from work around 2am. She says she was out of her insulin for about 2 weeks.  She says she was too busy to go to the pharmacy.   She says her mood is good.    She is having some allergies now and has some runny nose.   She also c/o swollen gum and it bleeds.   Relevant past medical, surgical, family and social history reviewed and updated as indicated. Interim medical history since our last visit reviewed. Allergies and medications reviewed and updated.   Current Outpatient Medications:  .  amLODipine (NORVASC) 10 MG tablet, TAKE 1 TABLET BY MOUTH ONCE DAILY TOME  UNA  TABLETA  POR  BOCA  DIARIA, Disp: 30 tablet, Rfl: 3 .  aspirin 81 MG tablet, Take 1 tablet (81 mg total) by mouth daily., Disp: , Rfl:  .  benzonatate (TESSALON PERLES) 100 MG capsule, 1 - 2 po q 8 hour prn cough.  1-2 por boca cad 8 horas cuando sea necesario para la toz, Disp: 20 capsule, Rfl: 3 .  fexofenadine (ALLEGRA) 180 MG tablet, Take 180 mg by mouth daily., Disp: , Rfl:  .  fish oil-omega-3 fatty acids 1000 MG capsule, Take 2 capsules by mouth 2 (two) times daily. , Disp: , Rfl:  .  gabapentin (NEURONTIN) 300 MG capsule, 1 po bid.  Tome una tableta por boca dos veces diarias, Disp: 60 capsule, Rfl: 4 .  glipiZIDE (GLUCOTROL) 10 MG tablet, TAKE 2 TABLETS BY MOUTH TWICE DAILY WITH  A  MEAL  TOME  DOS  TABLETAS  POR  BOCA  DOS  VECES  DIARIAS  CON   COMIDA, Disp: 120 tablet, Rfl: 3 .  insulin NPH-regular Human (NOVOLIN 70/30 RELION) (70-30) 100 UNIT/ML injection, Inject 42 units SQ before breakfast & 40 units SQ before supper.  Inyecte 42 unidades subcutaneos antes de el desayuno y inyecte 40 unidades subcutaneos antes de la cena, Disp: 1 vial, Rfl: 4 .  lisinopril-hydrochlorothiazide (PRINZIDE,ZESTORETIC) 20-25 MG tablet, TAKE 1 TABLET BY MOUTH ONCE DAILY TOME  UNA  TABLETA  POR  BOCA  DIARIA, Disp: 30 tablet, Rfl: 0 .  metFORMIN (GLUCOPHAGE) 1000 MG tablet, TAKE 1 TABLET BY MOUTH TWICE DAILY TOME  UNA  TABLETA  POR  BOCA  DOS  VECES  DIARIAS  CON  COMIDA, Disp: 60 tablet, Rfl: 3 .  metoprolol tartrate (LOPRESSOR) 25 MG tablet, TAKE 1/2 (ONE-HALF) TABLET BY MOUTH TWICE DAILY TOME  MEDIA  TABLETA  POR  BOCA  DOS  VECES  DIARIAS, Disp: 30 tablet, Rfl: 3 .  naproxen (NAPROSYN) 500 MG tablet, 1 po q 12 hours prn pain.  Tome una tableta por boca cada 12 horas cuando sea necesario para el dolor, Disp: 60 tablet, Rfl:  2 .  nitroGLYCERIN (NITROSTAT) 0.4 MG SL tablet, Place 1 tablet under the tongue every 5 minutes as needed for chest pain.  ponga una tableta por abajo de la lengua cada 5 minutos cuando sea necesario para el dolor de pecho, Disp: 25 tablet, Rfl: 3 .  ranitidine (ZANTAC) 300 MG tablet, TAKE 1 TABLET BY MOUTH ONCE DAILY TOME  UNA  TABLETA  POR  BOCA  DIARIA, Disp: 30 tablet, Rfl: 4 .  Sennosides (TGT NATURAL LAXATIVE PILLS) 25 MG TABS, Take 16.12 mg by mouth 2 (two) times daily. , Disp: , Rfl:  .  simvastatin (ZOCOR) 20 MG tablet, TAKE 1 TABLET BY MOUTH AT BEDTIME TOME  UNA  TABLETA  POR  LA  BOCA  AL  DORMIR, Disp: 30 tablet, Rfl: 0   Review of Systems  Constitutional: Positive for fatigue. Negative for appetite change, chills, diaphoresis, fever and unexpected weight change.  HENT: Positive for dental problem, mouth sores, sneezing and sore throat. Negative for congestion, drooling, ear pain, facial swelling, hearing loss, trouble  swallowing and voice change.   Eyes: Negative for pain, discharge, redness, itching and visual disturbance.  Respiratory: Negative for cough, choking, shortness of breath and wheezing.   Cardiovascular: Negative for chest pain, palpitations and leg swelling.  Gastrointestinal: Positive for constipation. Negative for abdominal pain, blood in stool, diarrhea and vomiting.  Endocrine: Positive for heat intolerance. Negative for cold intolerance and polydipsia.  Genitourinary: Negative for decreased urine volume, dysuria and hematuria.  Musculoskeletal: Negative for arthralgias, back pain and gait problem.  Skin: Negative for rash.  Allergic/Immunologic: Positive for environmental allergies.  Neurological: Negative for seizures, syncope, light-headedness and headaches.  Hematological: Negative for adenopathy.  Psychiatric/Behavioral: Negative for agitation, dysphoric mood and suicidal ideas. The patient is not nervous/anxious.     Per HPI unless specifically indicated above     Objective:    BP 132/81 (BP Location: Right Arm, Patient Position: Sitting, Cuff Size: Normal)   Pulse 65   Temp (!) 97.5 F (36.4 C)   Ht 5' (1.524 m)   Wt 151 lb 12 oz (68.8 kg)   LMP 03/05/2015   SpO2 96%   BMI 29.64 kg/m   Wt Readings from Last 3 Encounters:  11/09/17 151 lb 12 oz (68.8 kg)  09/02/17 156 lb 8 oz (71 kg)  08/26/17 154 lb (69.9 kg)    Physical Exam  Constitutional: She is oriented to person, place, and time. She appears well-developed and well-nourished.  HENT:  Head: Normocephalic and atraumatic.  Neck: Neck supple.  Cardiovascular: Normal rate and regular rhythm.  Pulmonary/Chest: Effort normal and breath sounds normal.  Abdominal: Soft. Bowel sounds are normal. She exhibits no mass. There is no hepatosplenomegaly. There is no tenderness.  Musculoskeletal: She exhibits no edema.  Lymphadenopathy:    She has no cervical adenopathy.  Neurological: She is alert and oriented to  person, place, and time.  Skin: Skin is warm and dry.  Psychiatric: She has a normal mood and affect. Her behavior is normal.  Vitals reviewed.   Results for orders placed or performed during the hospital encounter of 11/06/17  Lipid panel  Result Value Ref Range   Cholesterol 113 0 - 200 mg/dL   Triglycerides 94 <150 mg/dL   HDL 30 (L) >40 mg/dL   Total CHOL/HDL Ratio 3.8 RATIO   VLDL 19 0 - 40 mg/dL   LDL Cholesterol 64 0 - 99 mg/dL  Comprehensive metabolic panel  Result Value Ref Range  Sodium 136 135 - 145 mmol/L   Potassium 4.1 3.5 - 5.1 mmol/L   Chloride 101 98 - 111 mmol/L   CO2 28 22 - 32 mmol/L   Glucose, Bld 211 (H) 70 - 99 mg/dL   BUN 16 6 - 20 mg/dL   Creatinine, Ser 0.45 0.44 - 1.00 mg/dL   Calcium 8.8 (L) 8.9 - 10.3 mg/dL   Total Protein 7.5 6.5 - 8.1 g/dL   Albumin 3.8 3.5 - 5.0 g/dL   AST 19 15 - 41 U/L   ALT 18 0 - 44 U/L   Alkaline Phosphatase 87 38 - 126 U/L   Total Bilirubin 0.5 0.3 - 1.2 mg/dL   GFR calc non Af Amer >60 >60 mL/min   GFR calc Af Amer >60 >60 mL/min   Anion gap 7 5 - 15  Hemoglobin A1c  Result Value Ref Range   Hgb A1c MFr Bld 9.5 (H) 4.8 - 5.6 %   Mean Plasma Glucose 225.95 mg/dL      Assessment & Plan:    Encounter Diagnoses  Name Primary?  Marland Kitchen Uncontrolled type 2 diabetes mellitus with complication, with long-term current use of insulin ([place]) Yes  . Coronary artery disease involving native coronary artery of native heart without angina pectoris   . Essential hypertension   . Hyperlipidemia, unspecified hyperlipidemia type   . Diabetic polyneuropathy associated with type 2 diabetes mellitus ([place])   . Screening for breast cancer     -reviewed labs with pt -Refer for annual DM eye exam -Increase insulin to 45 unidades bid.  Counseled pt to avoid running out of insulin.  Pt to monitor fbs and watch diabetic diet -pt given information to contact [facility] for screening mammogram -pt to follow up 6 wk with bs log.  RTO sooner prn

## 2017-12-21 ENCOUNTER — Other Ambulatory Visit: Payer: Self-pay | Admitting: Physician Assistant

## 2017-12-21 ENCOUNTER — Encounter: Payer: Self-pay | Admitting: Physician Assistant

## 2017-12-21 ENCOUNTER — Ambulatory Visit: Payer: Self-pay | Admitting: Physician Assistant

## 2017-12-21 VITALS — BP 124/68 | HR 65 | Temp 97.5°F | Ht 60.0 in | Wt 151.0 lb

## 2017-12-21 DIAGNOSIS — IMO0002 Reserved for concepts with insufficient information to code with codable children: Secondary | ICD-10-CM

## 2017-12-21 DIAGNOSIS — E785 Hyperlipidemia, unspecified: Secondary | ICD-10-CM

## 2017-12-21 DIAGNOSIS — E1142 Type 2 diabetes mellitus with diabetic polyneuropathy: Secondary | ICD-10-CM

## 2017-12-21 DIAGNOSIS — E118 Type 2 diabetes mellitus with unspecified complications: Principal | ICD-10-CM

## 2017-12-21 DIAGNOSIS — K0510 Chronic gingivitis, plaque induced: Secondary | ICD-10-CM

## 2017-12-21 DIAGNOSIS — E1165 Type 2 diabetes mellitus with hyperglycemia: Secondary | ICD-10-CM

## 2017-12-21 DIAGNOSIS — I10 Essential (primary) hypertension: Secondary | ICD-10-CM

## 2017-12-21 DIAGNOSIS — I2510 Atherosclerotic heart disease of native coronary artery without angina pectoris: Secondary | ICD-10-CM

## 2017-12-21 DIAGNOSIS — Z794 Long term (current) use of insulin: Principal | ICD-10-CM

## 2017-12-21 MED ORDER — METOPROLOL TARTRATE 25 MG PO TABS: ORAL_TABLET | ORAL | 1 refills | Status: DC

## 2017-12-21 MED ORDER — LISINOPRIL-HYDROCHLOROTHIAZIDE 20-25 MG PO TABS: ORAL_TABLET | ORAL | 1 refills | Status: DC

## 2017-12-21 MED ORDER — AMLODIPINE BESYLATE 10 MG PO TABS: ORAL_TABLET | ORAL | 1 refills | Status: DC

## 2017-12-21 MED ORDER — METFORMIN HCL 1000 MG PO TABS: ORAL_TABLET | ORAL | 1 refills | Status: DC

## 2017-12-21 MED ORDER — GABAPENTIN 300 MG PO CAPS: ORAL_CAPSULE | ORAL | 1 refills | Status: DC

## 2017-12-21 MED ORDER — GLIPIZIDE 10 MG PO TABS: ORAL_TABLET | ORAL | 1 refills | Status: DC

## 2017-12-21 MED ORDER — RANITIDINE HCL 300 MG PO TABS: ORAL_TABLET | ORAL | 1 refills | Status: DC

## 2017-12-21 MED ORDER — GABAPENTIN 300 MG PO CAPS: ORAL_CAPSULE | ORAL | 4 refills | Status: DC

## 2017-12-21 NOTE — Progress Notes (Signed)
BP 124/68 (BP Location: Left Arm, Patient Position: Sitting, Cuff Size: Normal)   Pulse 65   Temp (!) 97.5 F (36.4 C) (Other (Comment))   Ht 5' (1.524 m)   Wt 151 lb (68.5 kg)   LMP 03/05/2015   SpO2 97%   BMI 29.49 kg/m    Subjective:    Patient ID: Yesenia Hoffman, female    DOB: [birth year], 52 y.o.   MRN: [id number]  HPI: DORLEEN KISSEL is a 52 y.o. female presenting on 12/21/2017 for Diabetes   HPI   Her insulin was increased to 45u bid at last OV 1 mo ago.  She says she is checking her bs in the mornings when she wakes up bs past week 133-162.  No lows.   Pt did not call to schedule mammogram as instructed at last OV  Pt says her gums are still bleeding.  She says she tried dental floss but it made her gums bleed so she didn't use it again.   Relevant past medical, surgical, family and social history reviewed and updated as indicated. Interim medical history since our last visit reviewed. Allergies and medications reviewed and updated.   Current Outpatient Medications:  .  amLODipine (NORVASC) 10 MG tablet, TAKE 1 TABLET BY MOUTH ONCE DAILY TOME  UNA  TABLETA  POR  BOCA  DIARIA, Disp: 30 tablet, Rfl: 3 .  aspirin 81 MG tablet, Take 1 tablet (81 mg total) by mouth daily., Disp: , Rfl:  .  fexofenadine (ALLEGRA) 180 MG tablet, Take 180 mg by mouth daily., Disp: , Rfl:  .  fish oil-omega-3 fatty acids 1000 MG capsule, Take 2 capsules by mouth 2 (two) times daily. , Disp: , Rfl:  .  glipiZIDE (GLUCOTROL) 10 MG tablet, TAKE 2 TABLETS BY MOUTH TWICE DAILY WITH  A  MEAL  TOME  DOS  TABLETAS  POR  BOCA  DOS  VECES  DIARIAS  CON  COMIDA, Disp: 120 tablet, Rfl: 3 .  insulin NPH-regular Human (NOVOLIN 70/30 RELION) (70-30) 100 UNIT/ML injection, Inject 42 units SQ before breakfast & 40 units SQ before supper.  Inyecte 42 unidades subcutaneos antes de el desayuno y inyecte 40 unidades subcutaneos antes de la cena, Disp: 1 vial, Rfl: 4 .  lisinopril-hydrochlorothiazide  (PRINZIDE,ZESTORETIC) 20-25 MG tablet, TAKE 1 TABLET BY MOUTH ONCE DAILY TOME  UNA  TABLETA  POR  BOCA  DIARIA, Disp: 30 tablet, Rfl: 0 .  metFORMIN (GLUCOPHAGE) 1000 MG tablet, TAKE 1 TABLET BY MOUTH TWICE DAILY TOME  UNA  TABLETA  POR  BOCA  DOS  VECES  DIARIAS  CON  COMIDA, Disp: 60 tablet, Rfl: 3 .  metoprolol tartrate (LOPRESSOR) 25 MG tablet, TAKE 1/2 (ONE-HALF) TABLET BY MOUTH TWICE DAILY TOME  MEDIA  TABLETA  POR  BOCA  DOS  VECES  DIARIAS, Disp: 30 tablet, Rfl: 3 .  naproxen (NAPROSYN) 500 MG tablet, 1 po q 12 hours prn pain.  Tome una tableta por boca cada 12 horas cuando sea necesario para el dolor, Disp: 60 tablet, Rfl: 2 .  nitroGLYCERIN (NITROSTAT) 0.4 MG SL tablet, Place 1 tablet under the tongue every 5 minutes as needed for chest pain.  ponga una tableta por abajo de la lengua cada 5 minutos cuando sea necesario para el dolor de pecho, Disp: 25 tablet, Rfl: 3 .  ranitidine (ZANTAC) 300 MG tablet, TAKE 1 TABLET BY MOUTH ONCE DAILY TOME  UNA  TABLETA  POR  BOCA  DIARIA, Disp:  30 tablet, Rfl: 4 .  Sennosides (TGT NATURAL LAXATIVE PILLS) 25 MG TABS, Take 16.12 mg by mouth 2 (two) times daily. , Disp: , Rfl:  .  simvastatin (ZOCOR) 20 MG tablet, TAKE 1 TABLET BY MOUTH AT BEDTIME TOME  UNA  TABLETA  POR  LA  BOCA  AL  DORMIR, Disp: 30 tablet, Rfl: 0 .  gabapentin (NEURONTIN) 300 MG capsule, 1 po bid.  Tome una tableta por boca dos veces diarias (Patient not taking: Reported on 12/21/2017), Disp: 60 capsule, Rfl: 4  Review of Systems  Constitutional: Positive for fatigue. Negative for appetite change, chills, diaphoresis, fever and unexpected weight change.  HENT: Positive for congestion, dental problem, mouth sores and sneezing. Negative for drooling, ear pain, facial swelling, hearing loss, sore throat, trouble swallowing and voice change.   Eyes: Negative for pain, discharge, redness, itching and visual disturbance.  Respiratory: Negative for cough, choking, shortness of breath and  wheezing.   Cardiovascular: Positive for leg swelling. Negative for chest pain and palpitations.  Gastrointestinal: Positive for constipation. Negative for abdominal pain, blood in stool, diarrhea and vomiting.  Endocrine: Negative for cold intolerance, heat intolerance and polydipsia.  Genitourinary: Negative for decreased urine volume, dysuria and hematuria.  Musculoskeletal: Positive for back pain and gait problem. Negative for arthralgias.  Skin: Negative for rash.  Allergic/Immunologic: Positive for environmental allergies.  Neurological: Negative for seizures, syncope, light-headedness and headaches.  Hematological: Negative for adenopathy.  Psychiatric/Behavioral: Negative for agitation, dysphoric mood and suicidal ideas. The patient is not nervous/anxious.     Per HPI unless specifically indicated above     Objective:    BP 124/68 (BP Location: Left Arm, Patient Position: Sitting, Cuff Size: Normal)   Pulse 65   Temp (!) 97.5 F (36.4 C) (Other (Comment))   Ht 5' (1.524 m)   Wt 151 lb (68.5 kg)   LMP 03/05/2015   SpO2 97%   BMI 29.49 kg/m   Wt Readings from Last 3 Encounters:  12/21/17 151 lb (68.5 kg)  11/09/17 151 lb 12 oz (68.8 kg)  09/02/17 156 lb 8 oz (71 kg)    Physical Exam  Constitutional: She is oriented to person, place, and time. She appears well-developed and well-nourished.  HENT:  Head: Normocephalic and atraumatic.  Mouth/Throat: Uvula is midline.  Gums puffy and inflamed  Neck: Neck supple.  Cardiovascular: Normal rate and regular rhythm.  Pulmonary/Chest: Effort normal and breath sounds normal.  Abdominal: Soft. Bowel sounds are normal. She exhibits no mass. There is no hepatosplenomegaly. There is no tenderness.  Musculoskeletal: She exhibits no edema.  Lymphadenopathy:    She has no cervical adenopathy.  Neurological: She is alert and oriented to person, place, and time.  Skin: Skin is warm and dry.  Psychiatric: She has a normal mood and  affect. Her behavior is normal.  Vitals reviewed.   Results for orders placed or performed during the hospital encounter of 11/06/17  Lipid panel  Result Value Ref Range   Cholesterol 113 0 - 200 mg/dL   Triglycerides 94 <150 mg/dL   HDL 30 (L) >40 mg/dL   Total CHOL/HDL Ratio 3.8 RATIO   VLDL 19 0 - 40 mg/dL   LDL Cholesterol 64 0 - 99 mg/dL  Comprehensive metabolic panel  Result Value Ref Range   Sodium 136 135 - 145 mmol/L   Potassium 4.1 3.5 - 5.1 mmol/L   Chloride 101 98 - 111 mmol/L   CO2 28 22 - 32 mmol/L  Glucose, Bld 211 (H) 70 - 99 mg/dL   BUN 16 6 - 20 mg/dL   Creatinine, Ser 0.45 0.44 - 1.00 mg/dL   Calcium 8.8 (L) 8.9 - 10.3 mg/dL   Total Protein 7.5 6.5 - 8.1 g/dL   Albumin 3.8 3.5 - 5.0 g/dL   AST 19 15 - 41 U/L   ALT 18 0 - 44 U/L   Alkaline Phosphatase 87 38 - 126 U/L   Total Bilirubin 0.5 0.3 - 1.2 mg/dL   GFR calc non Af Amer >60 >60 mL/min   GFR calc Af Amer >60 >60 mL/min   Anion gap 7 5 - 15  Hemoglobin A1c  Result Value Ref Range   Hgb A1c MFr Bld 9.5 (H) 4.8 - 5.6 %   Mean Plasma Glucose 225.95 mg/dL      Assessment & Plan:   Encounter Diagnoses  Name Primary?  Marland Kitchen Uncontrolled type 2 diabetes mellitus with complication, with long-term current use of insulin ([place]) Yes  . Coronary artery disease involving native coronary artery of native heart without angina pectoris   . Essential hypertension   . Hyperlipidemia, unspecified hyperlipidemia type   . Diabetic polyneuropathy associated with type 2 diabetes mellitus ([place])   . Gingivitis     -pt to Increase insulin to 50units bid.  She is to continue to monitor fbs and call office for < 70 or > 300.   -pt again counseled to Call for mammogram and she is given contact information -pt is counseled on dental care -pt to follow up 6 weeks.  RTO sooner prn

## 2017-12-21 NOTE — Patient Instructions (Addendum)
[facility] [phone] Yesenia Hoffman  -----------------------------   Cuidado dental preventivo en los adultos Preventive Dental Care, Adult El cuidado dental preventivo incluye visitar al dentista con regularidad y mantener un buen cuidado dental (higiene bucal) en el hogar. Estas medidas pueden ayudar a prevenir las caries y otros problemas dentales, problemas en el canal radicular, enfermedad en las encas (gingivitis) y la prdida de dientes. Los exmenes dentales regulares tambin pueden ayudar al dentista a diagnosticar otros problemas mdicos. Muchas enfermedades, incluido los cnceres de la boca, presentan signos iniciales que pueden detectarse durante una visita de cuidado Production manager. Qu puedo esperar de las visitas al dentista? Muchos adultos visitan al dentista una o dos veces al ao para que les hagan una limpieza y exmenes bucales. Hable con el dentista sobre el mejor cuidado dental preventivo para usted. En la visita, el dentista puede hacerle preguntas acerca de lo siguiente:  Su dieta y salud general.  Cualquier sntoma nuevo, como los siguientes: ? Encas sangrantes. ? Dolor en la boca, los dientes o la [place].  El Production assistant, radio la boca (examen bucal) para verificar lo siguiente:  Caries.  Gingivitis u otros problemas.  Signos de cncer.  Hinchazn o bultos en el cuello.  Movimiento anormal de la mandbula o dolor en la articulacin de la [place].  Probablemente tambin le hagan:  Radiografas dentales.  Limpieza dental.  Si usted tiene un problema en una etapa inicial, como una caries, el dentista programar una cita para brindarle tratamiento. Si tiene un problema en la raz del diente, enfermedad en las encas o un signo de otra enfermedad, el dentista puede derivarlo a otro profesional. De qu forma puedo cuidarme los dientes en casa?  Cepllese los dientes con una pasta dental con flor aprobada todas las [place] y las noches. Si es  posible, cepllese antes de que pasen 73minutos de haber terminado cada comida.  Psese el hilo dental una vez al da, [employer].  Despus del cepillado, contrlese de forma peridica los dientes para ver si tienen manchas marrones o blancas. Estas pueden ser signos de caries.  Examine las encas para detectar sangrado o hinchazn. Estos pueden ser signos de enfermedad en las encas, como gingivitis o periodontitis.  Asegrese de que su dieta incluya muchas frutas, verduras, [place] y otros productos lcteos, cereales integrales y protenas. No coma muchos alimentos con almidn o azcar agregada. Hable con el mdico si tiene preguntas sobre cmo seguir una dieta saludable.  Evite los refrescos, refrigerios azucarado y los Nurse, mental health.  No fume.  No se haga perforaciones en la boca.  Si tiene [employer] o las encas, haga grgaras con una mezcla de agua y sal 3 o 4veces al da, o cuando sea necesario. Para preparar la mezcla de agua con sal, disuelva por completo de media a 1cucharadita de sal en 1taza de agua tibia.  Tome los medicamentos de venta libre y los recetados solamente como se lo haya indicado el dentista.  Si se le cae un diente permanente debido a un golpe: ? Encuentre el diente. ? Recjalo desde la parte de arriba (corona) con un pauelo de papel o una gasa. ? Lvelo durante un mximo de 10segundos con agua corriente fra. ? Trate de volver a ponerlo en el hueco de la enca. ? Si no puede volver a Civil Service fast streamer, pngalo en un vaso con leche. ? Vaya al consultorio del dentista de inmediato. Lleve el diente. Cundo debo buscar atencin mdica?  Llame al dentista si presenta lo siguiente:  [employer], los dientes o la [place].  Enrojecimiento, hinchazn o sangrado en las encas.  Uno o ms dientes muy sensibles al calor o al fro.  Aliento muy feo.  Un problema con un empaste, una corona, un implante o la dentadura  postiza.  Un diente roto o flojo.  Un crecimiento o una lcera que no desaparecen en la boca.  Dnde encontrar ms informacin: Asociacin Dental Americana (American Dental Association): http://clayton-rivera.info/ Esta informacin no tiene Marine scientist el consejo del mdico. Asegrese de hacerle al mdico cualquier pregunta que tenga. Document Released: 03/26/2015 Document Revised: 08/26/2016 Document Reviewed: 09/26/2014 Elsevier Interactive Patient Education  2018 [employer].

## 2017-12-22 ENCOUNTER — Other Ambulatory Visit: Payer: Self-pay | Admitting: Physician Assistant

## 2017-12-22 MED ORDER — ATORVASTATIN CALCIUM 20 MG PO TABS: 20.0000 mg | ORAL_TABLET | Freq: Every day | ORAL | 1 refills | Status: DC

## 2017-12-27 ENCOUNTER — Other Ambulatory Visit: Payer: Self-pay | Admitting: Physician Assistant

## 2017-12-29 ENCOUNTER — Telehealth: Payer: Self-pay | Admitting: Cardiology

## 2017-12-29 NOTE — Telephone Encounter (Signed)
Numerous attempts to contact patient with recall letters. Unable to reach by telephone. with no success.   Delfino Lovett T [[id number]] 01/03/2016 9:39 AM New [10]   [System] 03/28/2016 11:03 PM Notification Sent [20]   Chanda Busing [[id number]] 04/13/2017 11:34 AM Notification Sent [20]   Chanda Busing [[id number]] 06/17/2017 9:04 AM Notification Sent [20]   Chanda Busing [[id number]] 07/08/2017 11:39 AM Notification Sent [20]

## 2018-01-11 ENCOUNTER — Other Ambulatory Visit: Payer: Self-pay | Admitting: Physician Assistant

## 2018-01-12 ENCOUNTER — Other Ambulatory Visit: Payer: Self-pay | Admitting: Physician Assistant

## 2018-01-12 MED ORDER — METOPROLOL TARTRATE 25 MG PO TABS: ORAL_TABLET | ORAL | 1 refills | Status: DC

## 2018-01-12 MED ORDER — RANITIDINE HCL 300 MG PO TABS: ORAL_TABLET | ORAL | 1 refills | Status: DC

## 2018-01-25 ENCOUNTER — Other Ambulatory Visit (HOSPITAL_COMMUNITY)
Admission: RE | Admit: 2018-01-25 | Discharge: 2018-01-25 | Disposition: A | Discharge status: Home / Self Care | Payer: Self-pay | Source: Ambulatory Visit | Attending: Physician Assistant | Admitting: Physician Assistant

## 2018-01-25 DIAGNOSIS — E785 Hyperlipidemia, unspecified: Secondary | ICD-10-CM | POA: Insufficient documentation

## 2018-01-25 DIAGNOSIS — I10 Essential (primary) hypertension: Secondary | ICD-10-CM | POA: Insufficient documentation

## 2018-01-25 DIAGNOSIS — I2510 Atherosclerotic heart disease of native coronary artery without angina pectoris: Secondary | ICD-10-CM | POA: Insufficient documentation

## 2018-01-25 DIAGNOSIS — E1165 Type 2 diabetes mellitus with hyperglycemia: Secondary | ICD-10-CM | POA: Insufficient documentation

## 2018-01-25 DIAGNOSIS — Z794 Long term (current) use of insulin: Secondary | ICD-10-CM | POA: Insufficient documentation

## 2018-01-25 DIAGNOSIS — IMO0002 Reserved for concepts with insufficient information to code with codable children: Secondary | ICD-10-CM

## 2018-01-25 DIAGNOSIS — E118 Type 2 diabetes mellitus with unspecified complications: Secondary | ICD-10-CM | POA: Insufficient documentation

## 2018-01-25 LAB — LIPID PANEL
CHOL/HDL RATIO: 3.6 ratio
CHOLESTEROL: 111 mg/dL (ref 0–200)
HDL: 31 mg/dL — ABNORMAL LOW (ref >40)
LDL Cholesterol: 56 mg/dL (ref 0–99)
TRIGLYCERIDES: 122 mg/dL (ref <150)
VLDL: 24 mg/dL (ref 0–40)

## 2018-01-25 LAB — COMPREHENSIVE METABOLIC PANEL
ALT: 16 U/L (ref 0–44)
ANION GAP: 8 (ref 5–15)
AST: 17 U/L (ref 15–41)
Albumin: 3.9 g/dL (ref 3.5–5.0)
Alkaline Phosphatase: 89 U/L (ref 38–126)
BILIRUBIN TOTAL: 0.7 mg/dL (ref 0.3–1.2)
BUN: 12 mg/dL (ref 6–20)
CHLORIDE: 101 mmol/L (ref 98–111)
CO2: 29 mmol/L (ref 22–32)
Calcium: 9.1 mg/dL (ref 8.9–10.3)
Creatinine, Ser: 0.45 mg/dL (ref 0.44–1.00)
Glucose, Bld: 178 mg/dL — ABNORMAL HIGH (ref 70–99)
Potassium: 4.3 mmol/L (ref 3.5–5.1)
Sodium: 138 mmol/L (ref 135–145)
TOTAL PROTEIN: 7.8 g/dL (ref 6.5–8.1)

## 2018-01-26 LAB — HEMOGLOBIN A1C
HEMOGLOBIN A1C: 9.4 % — AB (ref 4.8–5.6)
Mean Plasma Glucose: 223 mg/dL

## 2018-01-31 ENCOUNTER — Other Ambulatory Visit: Payer: Self-pay | Admitting: Physician Assistant

## 2018-02-01 ENCOUNTER — Ambulatory Visit: Payer: Self-pay | Admitting: Physician Assistant

## 2018-02-01 ENCOUNTER — Other Ambulatory Visit: Payer: Self-pay | Admitting: Physician Assistant

## 2018-02-08 ENCOUNTER — Encounter: Payer: Self-pay | Admitting: Physician Assistant

## 2018-02-08 ENCOUNTER — Ambulatory Visit: Payer: Self-pay | Admitting: Physician Assistant

## 2018-02-08 VITALS — BP 134/77 | HR 71 | Temp 97.0°F

## 2018-02-08 DIAGNOSIS — K0510 Chronic gingivitis, plaque induced: Secondary | ICD-10-CM

## 2018-02-08 DIAGNOSIS — I2510 Atherosclerotic heart disease of native coronary artery without angina pectoris: Secondary | ICD-10-CM

## 2018-02-08 DIAGNOSIS — IMO0002 Reserved for concepts with insufficient information to code with codable children: Secondary | ICD-10-CM

## 2018-02-08 DIAGNOSIS — E1142 Type 2 diabetes mellitus with diabetic polyneuropathy: Secondary | ICD-10-CM

## 2018-02-08 DIAGNOSIS — Z794 Long term (current) use of insulin: Principal | ICD-10-CM

## 2018-02-08 DIAGNOSIS — I10 Essential (primary) hypertension: Secondary | ICD-10-CM

## 2018-02-08 DIAGNOSIS — E1165 Type 2 diabetes mellitus with hyperglycemia: Secondary | ICD-10-CM

## 2018-02-08 DIAGNOSIS — E118 Type 2 diabetes mellitus with unspecified complications: Principal | ICD-10-CM

## 2018-02-08 DIAGNOSIS — E785 Hyperlipidemia, unspecified: Secondary | ICD-10-CM

## 2018-02-08 DIAGNOSIS — Z1239 Encounter for other screening for malignant neoplasm of breast: Secondary | ICD-10-CM

## 2018-02-08 NOTE — Progress Notes (Signed)
BP 134/77   Pulse 71   Temp (!) 97 F (36.1 C)   LMP 03/05/2015   SpO2 100%    Subjective:    Patient ID: Yesenia Hoffman, female    DOB: 1965/05/13, 52 y.o.   MRN: [id number]  HPI: Yesenia Hoffman is a 52 y.o. female presenting on 02/08/2018 for Diabetes   HPI   She has now gotten her medassist meds.   She States only problem is neck pain some days.    Pt says she doesn't sleep well.  She gets home from work around 2 or 3 am and sometimes she goes to bed around 4am but sometimes not until around 6am.  She checks her fbs and has log- range 118-180.  She says she has been eating a lot of fruit lately.    Relevant past medical, surgical, family and social history reviewed and updated as indicated. Interim medical history since our last visit reviewed. Allergies and medications reviewed and updated.   Current Outpatient Medications:  .  amLODipine (NORVASC) 10 MG tablet, TAKE 1 TABLET BY MOUTH ONCE DAILY TOME  UNA  TABLETA  POR  BOCA  DIARIA, Disp: 90 tablet, Rfl: 1 .  aspirin 81 MG tablet, Take 1 tablet (81 mg total) by mouth daily., Disp: , Rfl:  .  atorvastatin (LIPITOR) 20 MG tablet, Take 1 tablet (20 mg total) by mouth daily., Disp: 90 tablet, Rfl: 1 .  fexofenadine (ALLEGRA) 180 MG tablet, Take 180 mg by mouth daily., Disp: , Rfl:  .  fish oil-omega-3 fatty acids 1000 MG capsule, Take 2 capsules by mouth 2 (two) times daily. , Disp: , Rfl:  .  gabapentin (NEURONTIN) 300 MG capsule, 1 po bid.  Tome una tableta por boca dos veces diarias, Disp: 180 capsule, Rfl: 1 .  glipiZIDE (GLUCOTROL) 10 MG tablet, TAKE 2 TABLETS BY MOUTH TWICE DAILY WITH  A  MEAL  TOME  DOS  TABLETAS  POR  BOCA  DOS  VECES  DIARIAS  CON  COMIDA, Disp: 360 tablet, Rfl: 1 .  insulin NPH-regular Human (NOVOLIN 70/30 RELION) (70-30) 100 UNIT/ML injection, Inject 42 units SQ before breakfast & 40 units SQ before supper.  Inyecte 42 unidades subcutaneos antes de el desayuno y inyecte 40 unidades subcutaneos  antes de la cena (Patient taking differently: 50 Units 2 (two) times daily with a meal. Inject 42 units SQ before breakfast & 40 units SQ before supper.  Inyecte 42 unidades subcutaneos antes de el desayuno y inyecte 40 unidades subcutaneos antes de la cena), Disp: 1 vial, Rfl: 4 .  lisinopril-hydrochlorothiazide (PRINZIDE,ZESTORETIC) 20-25 MG tablet, TAKE 1 TABLET BY MOUTH ONCE DAILY TOME  UNA  TABLETA  POR  BOCA  DIARIA, Disp: 90 tablet, Rfl: 1 .  metFORMIN (GLUCOPHAGE) 1000 MG tablet, TAKE 1 TABLET BY MOUTH TWICE DAILY TOME  UNA  TABLETA  POR  BOCA  DOS  VECES  DIARIAS  CON  COMIDA, Disp: 180 tablet, Rfl: 1 .  metoprolol tartrate (LOPRESSOR) 25 MG tablet, TAKE 1/2 (ONE-HALF) TABLET BY MOUTH TWICE DAILY TOME  MEDIA  TABLETA  POR  BOCA  DOS  VECES  DIARIAS, Disp: 45 tablet, Rfl: 1 .  naproxen (NAPROSYN) 500 MG tablet, 1 po q 12 hours prn pain.  Tome una tableta por boca cada 12 horas cuando sea necesario para el dolor, Disp: 60 tablet, Rfl: 2 .  nitroGLYCERIN (NITROSTAT) 0.4 MG SL tablet, Place 1 tablet under the tongue every 5 minutes  as needed for chest pain.  ponga una tableta por abajo de la lengua cada 5 minutos cuando sea necesario para el dolor de pecho, Disp: 25 tablet, Rfl: 3 .  ranitidine (ZANTAC) 300 MG tablet, TAKE 1 TABLET BY MOUTH ONCE DAILY TOME  UNA  TABLETA  POR  BOCA  DIARIA, Disp: 90 tablet, Rfl: 1 .  Sennosides (TGT NATURAL LAXATIVE PILLS) 25 MG TABS, Take 16.12 mg by mouth 2 (two) times daily. , Disp: , Rfl:    Review of Systems  Constitutional: Positive for fatigue. Negative for appetite change, chills, diaphoresis, fever and unexpected weight change.  HENT: Positive for congestion, facial swelling and mouth sores. Negative for dental problem, drooling, ear pain, hearing loss, sneezing, sore throat, trouble swallowing and voice change.   Eyes: Negative for pain, discharge, redness, itching and visual disturbance.  Respiratory: Negative for cough, choking, shortness of breath and  wheezing.   Cardiovascular: Negative for chest pain, palpitations and leg swelling.  Gastrointestinal: Negative for abdominal pain, blood in stool, constipation, diarrhea and vomiting.  Endocrine: Negative for cold intolerance, heat intolerance and polydipsia.  Genitourinary: Negative for decreased urine volume, dysuria and hematuria.  Musculoskeletal: Negative for arthralgias, back pain and gait problem.  Skin: Negative for rash.  Allergic/Immunologic: Negative for environmental allergies.  Neurological: Negative for seizures, syncope, light-headedness and headaches.  Hematological: Negative for adenopathy.  Psychiatric/Behavioral: Negative for agitation, dysphoric mood and suicidal ideas. The patient is not nervous/anxious.     Per HPI unless specifically indicated above     Objective:    BP 134/77   Pulse 71   Temp (!) 97 F (36.1 C)   LMP 03/05/2015   SpO2 100%   Wt Readings from Last 3 Encounters:  12/21/17 151 lb (68.5 kg)  11/09/17 151 lb 12 oz (68.8 kg)  09/02/17 156 lb 8 oz (71 kg)    Physical Exam  Constitutional: She is oriented to person, place, and time. She appears well-developed and well-nourished.  HENT:  Head: Normocephalic and atraumatic.  Neck: Neck supple.  Cardiovascular: Normal rate and regular rhythm.  Pulmonary/Chest: Effort normal and breath sounds normal.  Abdominal: Soft. Bowel sounds are normal. She exhibits no mass. There is no hepatosplenomegaly. There is no tenderness.  Musculoskeletal: She exhibits no edema.  Lymphadenopathy:    She has no cervical adenopathy.  Neurological: She is alert and oriented to person, place, and time.  Skin: Skin is warm and dry.  Psychiatric: She has a normal mood and affect. Her behavior is normal.  Vitals reviewed.   Results for orders placed or performed during the hospital encounter of 01/25/18  Hemoglobin A1c  Result Value Ref Range   Hgb A1c MFr Bld 9.4 (H) 4.8 - 5.6 %   Mean Plasma Glucose 223 mg/dL   Comprehensive metabolic panel  Result Value Ref Range   Sodium 138 135 - 145 mmol/L   Potassium 4.3 3.5 - 5.1 mmol/L   Chloride 101 98 - 111 mmol/L   CO2 29 22 - 32 mmol/L   Glucose, Bld 178 (H) 70 - 99 mg/dL   BUN 12 6 - 20 mg/dL   Creatinine, Ser 0.45 0.44 - 1.00 mg/dL   Calcium 9.1 8.9 - 10.3 mg/dL   Total Protein 7.8 6.5 - 8.1 g/dL   Albumin 3.9 3.5 - 5.0 g/dL   AST 17 15 - 41 U/L   ALT 16 0 - 44 U/L   Alkaline Phosphatase 89 38 - 126 U/L   Total Bilirubin  0.7 0.3 - 1.2 mg/dL   GFR calc non Af Amer >60 >60 mL/min   GFR calc Af Amer >60 >60 mL/min   Anion gap 8 5 - 15  Lipid panel  Result Value Ref Range   Cholesterol 111 0 - 200 mg/dL   Triglycerides 122 <150 mg/dL   HDL 31 (L) >40 mg/dL   Total CHOL/HDL Ratio 3.6 RATIO   VLDL 24 0 - 40 mg/dL   LDL Cholesterol 56 0 - 99 mg/dL      Assessment & Plan:   Encounter Diagnoses  Name Primary?  Marland Kitchen Uncontrolled type 2 diabetes mellitus with complication, with long-term current use of insulin ([place]) Yes  . Essential hypertension   . Hyperlipidemia, unspecified hyperlipidemia type   . Coronary artery disease involving native coronary artery of native heart without angina pectoris   . Gingivitis   . Diabetic polyneuropathy associated with type 2 diabetes mellitus ([place])   . Screening for breast cancer     -reviewed labs with pt -pt was Counseled on diabetic diet- pt to Watch diet -refer for screening Mammogram -pt is put on Dental list- counseled pt on dental care including brushing bid and flossing daily -no medication changes today -pt to follow up 4 weeks with bs log.  RTO sooner prn

## 2018-02-08 NOTE — Patient Instructions (Addendum)
Diabetic Eye exam- examen de la vista para diabticos  Wednesday/miercoles  November 13 10:00 am  --------------------------------------------------------------------   Diabetes mellitus y nutricin Diabetes Mellitus and Nutrition Si sufre de diabetes (diabetes mellitus), es muy importante tener hbitos alimenticios saludables debido a que sus niveles de Designer, television/film set sangre (glucosa) se ven afectados en gran medida por lo que come y bebe. [place] alimentos saludables en las cantidades [place], aproximadamente a la [employer], [place] ayudar a:  Aeronautical engineer glucemia.  Disminuir el riesgo de sufrir una enfermedad cardaca.  Mejorar la presin arterial.  Science writer o mantener un peso saludable.  Todas las personas que sufren de diabetes son diferentes y cada una tiene necesidades diferentes en cuanto a un plan de alimentacin. El mdico puede recomendarle que trabaje con un especialista en dietas y nutricin (nutricionista) para Financial trader plan para usted. Su plan de alimentacin puede variar segn factores como:  Las caloras que necesita.  Los medicamentos que toma.  Su peso.  Sus niveles de glucemia, presin arterial y colesterol.  Su nivel de [place].  Otras afecciones que tenga, como enfermedades cardacas o renales.  Cmo me afectan los carbohidratos? Los carbohidratos afectan el nivel de glucemia ms que cualquier otro tipo de alimento. La ingesta de carbohidratos naturalmente aumenta la cantidad glucosa en la sangre. El recuento de carbohidratos es un mtodo destinado a Catering manager un registro de la cantidad de carbohidratos que se ingieren. El recuento de carbohidratos es importante para Theatre manager la glucemia a un nivel saludable, en especial si utiliza insulina o toma determinados medicamentos por va oral para la diabetes. Es importante saber la cantidad de carbohidratos que se pueden ingerir en cada comida sin correr Engineer, manufacturing. Esto es Arts administrator. El nutricionista puede ayudarlo a calcular la cantidad de carbohidratos que debe ingerir en cada comida y colacin. Los alimentos que contienen carbohidratos incluyen:  Pan, cereal, arroz, pasta y galletas.  Papas y maz.  Guisantes, frijoles y lentejas.  Leche y Estate agent.  Lambert Mody y [place].  Postres, como pasteles, galletitas, helado y caramelos.  Cmo me afecta el alcohol? El alcohol puede provocar disminuciones sbitas de la glucemia (hipoglucemia), en especial si utiliza insulina o toma determinados medicamentos por va oral para la diabetes. La hipoglucemia es una afeccin potencialmente mortal. Los sntomas de la hipoglucemia (somnolencia, mareos y confusin) son similares a los sntomas de haber consumido demasiado alcohol. Si el mdico afirma que el alcohol es seguro para usted, siga estas pautas:  Limite el consumo de alcohol a no ms de 1 medida por da si es mujer y no est Music therapist, y a 2 medidas si es hombre. Una medida equivale a 12oz (375ml) de cerveza, 5oz (152ml) de vino o 1oz (52ml) de bebidas de alta graduacin alcohlica.  No beba con el estmago vaco.  Mantngase hidratado con agua, gaseosas dietticas o t helado sin azcar.  Tenga en cuenta que las gaseosas comunes, los jugos y otros refrescos pueden contener mucha azcar y se deben contar como carbohidratos.  Consejos para seguir Photographer las etiquetas de los alimentos  Comience por controlar el tamao de la porcin en la etiqueta. La cantidad de caloras, carbohidratos, grasas y otros nutrientes mencionados en la etiqueta se basan en una porcin del alimento. Muchos alimentos contienen ms de una porcin por envase.  Verifique la cantidad total de gramos (g) de carbohidratos totales en una porcin. Puede calcular la cantidad de porciones de carbohidratos al dividir el total  de carbohidratos por 15. Por ejemplo, si un alimento posee un total de 30g de carbohidratos, equivale a 2  porciones de carbohidratos.  Verifique la cantidad de gramos (g) de grasas saturadas y grasas trans en una porcin. Escoja alimentos que no contengan grasa o que tengan un bajo contenido.  Controle la cantidad de miligramos (mg) de sodio en una porcin. La mayora de las personas deben limitar la ingesta de sodio total a menos de 2300mg  por Training and development officer.  Siempre consulte la informacin nutricional de los alimentos etiquetados como "con bajo contenido de grasa" o "sin grasa". Estos alimentos pueden ser ms altos en azcar agregada o en carbohidratos refinados y deben evitarse.  Hable con el nutricionista para identificar sus objetivos diarios en cuanto a los nutrientes mencionados en la etiqueta. De compras  Evite comprar alimentos procesados, enlatados o prehechos. Estos alimentos tienden a TEFL teacher cantidad de [place], sodio y azcar agregada.  Compre en la zona exterior de la tienda de comestibles. Esta incluye frutas y [employer], granos a granel, carnes frescas y productos lcteos frescos. Coccin  Utilice mtodos de coccin a baja temperatura, como hornear, en lugar de mtodos de coccin a alta temperatura, como frer en abundante aceite.  Cocine con aceites saludables, como el aceite de [place], canola o IXL.  Evite cocinar con manteca, crema o carnes con alto contenido de grasa. Planificacin de las comidas  [employer] comidas y las colaciones de forma regular, preferentemente a la misma hora todos [place]. Evite pasar largos perodos de tiempo sin comer.  Consuma alimentos ricos en fibra, como frutas frescas, verduras, frijoles y cereales integrales. Consulte al nutricionista sobre cuntas porciones de carbohidratos puede consumir en cada comida.  Consuma entre 4 y 6 onzas de protenas magras por da, como carnes [place], pollo, pescado, [employer] o tofu. 1 onza equivale a 1 onza de carne, pollo o pescado, 1 huevo, o 1/4 taza de tofu.  Coma algunos alimentos por da que  contengan grasas saludables, como aguacates, frutos secos, semillas y pescado. Estilo de vida   Controle su nivel de glucemia con regularidad.  Haga ejercicio al menos 46minutos, 5das o ms por semana, o como se lo haya indicado el mdico.  Tome los [employer] se lo haya indicado el mdico.  No consuma ningn producto que contenga nicotina o tabaco, como cigarrillos y Psychologist, sport and exercise. Si necesita ayuda para dejar de fumar, consulte al [employer] con un asesor o instructor en diabetes para identificar estrategias para controlar el estrs y cualquier desafo emocional y social. Cules son algunas de las preguntas que puedo hacerle a mi mdico?  Es necesario que me rena con Radio broadcast assistant en diabetes?  Es necesario que me rena con un nutricionista?  A qu nmero puedo llamar si tengo preguntas?  Cules son los mejores momentos para controlar la glucemia? Dnde encontrar ms informacin:  Asociacin Americana de la Diabetes (American Diabetes Association): diabetes.org/food-and-fitness/food  Academia de Nutricin y Information systems manager (Academy of Nutrition and Dietetics): PokerClues.dk  [place] Diabetes y [place] y Water quality scientist [facility] of Diabetes and Digestive and Kidney Diseases) ([place], NIH): ContactWire.be Resumen  Un plan de alimentacin saludable lo ayudar a Aeronautical engineer glucemia y Theatre manager un estilo de vida saludable.  Trabajar con un especialista en dietas y nutricin (nutricionista) puede ayudarlo a Insurance claims handler de alimentacin para usted.  Tenga en cuenta que los carbohidratos y el alcohol tienen efectos inmediatos en sus niveles de glucemia.  Es importante contar los carbohidratos y consumir alcohol con prudencia. Esta informacin no tiene Hydrologist el consejo del mdico. Asegrese de hacerle al mdico cualquier pregunta que tenga. Document Released: 07/22/2007 Document Revised: 08/04/2016 Document Reviewed: 08/04/2016 Elsevier Interactive Patient Education  2018 [employer].

## 2018-03-08 ENCOUNTER — Ambulatory Visit: Payer: Self-pay | Admitting: Physician Assistant

## 2018-03-22 ENCOUNTER — Encounter: Payer: Self-pay | Admitting: Physician Assistant

## 2018-03-22 ENCOUNTER — Ambulatory Visit: Payer: Self-pay | Admitting: Physician Assistant

## 2018-03-22 VITALS — BP 130/79 | HR 69 | Temp 97.7°F | Ht 60.0 in | Wt 154.0 lb

## 2018-03-22 DIAGNOSIS — E785 Hyperlipidemia, unspecified: Secondary | ICD-10-CM

## 2018-03-22 DIAGNOSIS — IMO0002 Reserved for concepts with insufficient information to code with codable children: Secondary | ICD-10-CM

## 2018-03-22 DIAGNOSIS — E118 Type 2 diabetes mellitus with unspecified complications: Principal | ICD-10-CM

## 2018-03-22 DIAGNOSIS — Z794 Long term (current) use of insulin: Principal | ICD-10-CM

## 2018-03-22 DIAGNOSIS — E1165 Type 2 diabetes mellitus with hyperglycemia: Secondary | ICD-10-CM

## 2018-03-22 DIAGNOSIS — E1142 Type 2 diabetes mellitus with diabetic polyneuropathy: Secondary | ICD-10-CM

## 2018-03-22 DIAGNOSIS — I2510 Atherosclerotic heart disease of native coronary artery without angina pectoris: Secondary | ICD-10-CM

## 2018-03-22 DIAGNOSIS — I10 Essential (primary) hypertension: Secondary | ICD-10-CM

## 2018-03-22 NOTE — Progress Notes (Signed)
BP 130/79 (BP Location: Right Arm, Patient Position: Sitting, Cuff Size: Normal)   Pulse 69   Temp 97.7 F (36.5 C)   Ht 5' (1.524 m)   Wt 154 lb (69.9 kg)   LMP 03/05/2015   SpO2 98%   BMI 30.08 kg/m    Subjective:    Patient ID: Yesenia Hoffman, female    DOB: 1966-01-07, 52 y.o.   MRN: [id number]  HPI: Yesenia Hoffman is a 52 y.o. female presenting on 03/22/2018 for Diabetes   HPI   Pt is using novolin 70/30 50 units am and 45 qhs.  Reviewed bs log.  Still a bit high.   Pt went to eye doctor 2 weeks  She says she is doing well and has no complaints today  Relevant past medical, surgical, family and social history reviewed and updated as indicated. Interim medical history since our last visit reviewed. Allergies and medications reviewed and updated.   Current Outpatient Medications:  .  amLODipine (NORVASC) 10 MG tablet, TAKE 1 TABLET BY MOUTH ONCE DAILY TOME  UNA  TABLETA  POR  BOCA  DIARIA, Disp: 90 tablet, Rfl: 1 .  aspirin 81 MG tablet, Take 1 tablet (81 mg total) by mouth daily., Disp: , Rfl:  .  atorvastatin (LIPITOR) 20 MG tablet, Take 1 tablet (20 mg total) by mouth daily., Disp: 90 tablet, Rfl: 1 .  fexofenadine (ALLEGRA) 180 MG tablet, Take 180 mg by mouth daily., Disp: , Rfl:  .  fish oil-omega-3 fatty acids 1000 MG capsule, Take 2 capsules by mouth 2 (two) times daily. , Disp: , Rfl:  .  gabapentin (NEURONTIN) 300 MG capsule, 1 po bid.  Tome una tableta por boca dos veces diarias, Disp: 180 capsule, Rfl: 1 .  glipiZIDE (GLUCOTROL) 10 MG tablet, TAKE 2 TABLETS BY MOUTH TWICE DAILY WITH  A  MEAL  TOME  DOS  TABLETAS  POR  BOCA  DOS  VECES  DIARIAS  CON  COMIDA, Disp: 360 tablet, Rfl: 1 .  insulin NPH-regular Human (NOVOLIN 70/30 RELION) (70-30) 100 UNIT/ML injection, Inject 42 units SQ before breakfast & 40 units SQ before supper.  Inyecte 42 unidades subcutaneos antes de el desayuno y inyecte 40 unidades subcutaneos antes de la cena (Patient taking differently:  Inject into the skin 2 (two) times daily with a meal. Inject 50 units SQ before breakfast & 45 units SQ before supper.  Inyecte 50 unidades subcutaneos antes de el desayuno y inyecte 45 unidades subcutaneos antes de la cena), Disp: 1 vial, Rfl: 4 .  lisinopril-hydrochlorothiazide (PRINZIDE,ZESTORETIC) 20-25 MG tablet, TAKE 1 TABLET BY MOUTH ONCE DAILY TOME  UNA  TABLETA  POR  BOCA  DIARIA, Disp: 90 tablet, Rfl: 1 .  metFORMIN (GLUCOPHAGE) 1000 MG tablet, TAKE 1 TABLET BY MOUTH TWICE DAILY TOME  UNA  TABLETA  POR  BOCA  DOS  VECES  DIARIAS  CON  COMIDA, Disp: 180 tablet, Rfl: 1 .  metoprolol tartrate (LOPRESSOR) 25 MG tablet, TAKE 1/2 (ONE-HALF) TABLET BY MOUTH TWICE DAILY TOME  MEDIA  TABLETA  POR  BOCA  DOS  VECES  DIARIAS, Disp: 45 tablet, Rfl: 1 .  naproxen (NAPROSYN) 500 MG tablet, 1 po q 12 hours prn pain.  Tome una tableta por boca cada 12 horas cuando sea necesario para el dolor, Disp: 60 tablet, Rfl: 2 .  nitroGLYCERIN (NITROSTAT) 0.4 MG SL tablet, Place 1 tablet under the tongue every 5 minutes as needed for chest pain.  ponga una tableta por abajo de la lengua cada 5 minutos cuando sea necesario para el dolor de pecho, Disp: 25 tablet, Rfl: 3 .  ranitidine (ZANTAC) 300 MG tablet, TAKE 1 TABLET BY MOUTH ONCE DAILY TOME  UNA  TABLETA  POR  BOCA  DIARIA, Disp: 90 tablet, Rfl: 1 .  Sennosides (TGT NATURAL LAXATIVE PILLS) 25 MG TABS, Take 16.12 mg by mouth 2 (two) times daily. , Disp: , Rfl:    Review of Systems  Constitutional: Positive for fatigue. Negative for appetite change, chills, diaphoresis, fever and unexpected weight change.  HENT: Positive for dental problem, mouth sores and sneezing. Negative for congestion, drooling, ear pain, facial swelling, hearing loss, sore throat, trouble swallowing and voice change.   Eyes: Negative for pain, discharge, redness, itching and visual disturbance.  Respiratory: Negative for cough, choking, shortness of breath and wheezing.   Cardiovascular:  Positive for leg swelling. Negative for chest pain and palpitations.  Gastrointestinal: Negative for abdominal pain, blood in stool, constipation, diarrhea and vomiting.  Endocrine: Negative for cold intolerance, heat intolerance and polydipsia.  Genitourinary: Negative for decreased urine volume, dysuria and hematuria.  Musculoskeletal: Positive for arthralgias. Negative for back pain and gait problem.  Skin: Negative for rash.  Allergic/Immunologic: Positive for environmental allergies.  Neurological: Negative for seizures, syncope, light-headedness and headaches.  Hematological: Negative for adenopathy.  Psychiatric/Behavioral: Negative for agitation, dysphoric mood and suicidal ideas. The patient is not nervous/anxious.     Per HPI unless specifically indicated above     Objective:    BP 130/79 (BP Location: Right Arm, Patient Position: Sitting, Cuff Size: Normal)   Pulse 69   Temp 97.7 F (36.5 C)   Ht 5' (1.524 m)   Wt 154 lb (69.9 kg)   LMP 03/05/2015   SpO2 98%   BMI 30.08 kg/m   Wt Readings from Last 3 Encounters:  03/22/18 154 lb (69.9 kg)  12/21/17 151 lb (68.5 kg)  11/09/17 151 lb 12 oz (68.8 kg)    Physical Exam  Constitutional: She is oriented to person, place, and time. She appears well-developed and well-nourished.  HENT:  Head: Normocephalic and atraumatic.  Neck: Neck supple.  Cardiovascular: Normal rate and regular rhythm.  Pulmonary/Chest: Effort normal and breath sounds normal.  Abdominal: Soft. Bowel sounds are normal. She exhibits no mass. There is no hepatosplenomegaly. There is no tenderness.  Musculoskeletal: She exhibits no edema.  Lymphadenopathy:    She has no cervical adenopathy.  Neurological: She is alert and oriented to person, place, and time.  Skin: Skin is warm and dry.  Psychiatric: She has a normal mood and affect. Her behavior is normal.  Vitals reviewed.       Assessment & Plan:    Encounter Diagnoses  Name Primary?  Marland Kitchen  Uncontrolled type 2 diabetes mellitus with complication, with long-term current use of insulin ([place]) Yes  . Essential hypertension   . Hyperlipidemia, unspecified hyperlipidemia type   . Coronary artery disease involving native coronary artery of native heart without angina pectoris   . Diabetic polyneuropathy associated with type 2 diabetes mellitus ([place])     -pt to Increase insulin to 55 and 50 units.  Pt counseled to monitor blood sugars and Call office for fbs < 70 or > 300 -pt to continue other medications -pt to follow up 2 months.  RTO sooner prn

## 2018-04-09 ENCOUNTER — Telehealth (HOSPITAL_COMMUNITY): Payer: Self-pay

## 2018-04-09 NOTE — Telephone Encounter (Signed)
Left a message for patient with interpreter Yesenia Hoffman to call back ro schedule appointment with [facility] for mammogram.

## 2018-04-15 ENCOUNTER — Encounter: Payer: Self-pay | Admitting: Physician Assistant

## 2018-05-12 ENCOUNTER — Other Ambulatory Visit (HOSPITAL_COMMUNITY): Payer: Self-pay | Admitting: *Deleted

## 2018-05-12 DIAGNOSIS — Z1231 Encounter for screening mammogram for malignant neoplasm of breast: Secondary | ICD-10-CM

## 2018-05-24 ENCOUNTER — Other Ambulatory Visit (HOSPITAL_COMMUNITY)
Admission: RE | Admit: 2018-05-24 | Discharge: 2018-05-24 | Disposition: A | Discharge status: Home / Self Care | Payer: Self-pay | Source: Ambulatory Visit | Attending: Physician Assistant | Admitting: Physician Assistant

## 2018-05-24 ENCOUNTER — Encounter: Payer: Self-pay | Admitting: Physician Assistant

## 2018-05-24 ENCOUNTER — Ambulatory Visit: Payer: Self-pay | Admitting: Physician Assistant

## 2018-05-24 VITALS — BP 140/70 | HR 58 | Temp 97.3°F | Ht 60.0 in | Wt 156.0 lb

## 2018-05-24 DIAGNOSIS — E118 Type 2 diabetes mellitus with unspecified complications: Secondary | ICD-10-CM | POA: Insufficient documentation

## 2018-05-24 DIAGNOSIS — I10 Essential (primary) hypertension: Secondary | ICD-10-CM

## 2018-05-24 DIAGNOSIS — E785 Hyperlipidemia, unspecified: Secondary | ICD-10-CM

## 2018-05-24 DIAGNOSIS — IMO0002 Reserved for concepts with insufficient information to code with codable children: Secondary | ICD-10-CM

## 2018-05-24 DIAGNOSIS — Z794 Long term (current) use of insulin: Secondary | ICD-10-CM | POA: Insufficient documentation

## 2018-05-24 DIAGNOSIS — I2510 Atherosclerotic heart disease of native coronary artery without angina pectoris: Secondary | ICD-10-CM

## 2018-05-24 DIAGNOSIS — E1165 Type 2 diabetes mellitus with hyperglycemia: Secondary | ICD-10-CM

## 2018-05-24 DIAGNOSIS — E1142 Type 2 diabetes mellitus with diabetic polyneuropathy: Secondary | ICD-10-CM | POA: Insufficient documentation

## 2018-05-24 DIAGNOSIS — K5900 Constipation, unspecified: Secondary | ICD-10-CM

## 2018-05-24 LAB — COMPREHENSIVE METABOLIC PANEL
ALT: 18 U/L (ref 0–44)
AST: 15 U/L (ref 15–41)
Albumin: 3.9 g/dL (ref 3.5–5.0)
Alkaline Phosphatase: 89 U/L (ref 38–126)
Anion gap: 8 (ref 5–15)
BUN: 11 mg/dL (ref 6–20)
CHLORIDE: 101 mmol/L (ref 98–111)
CO2: 28 mmol/L (ref 22–32)
CREATININE: 0.42 mg/dL — AB (ref 0.44–1.00)
Calcium: 9.3 mg/dL (ref 8.9–10.3)
GFR calc Af Amer: >60 mL/min (ref >60)
GFR calc non Af Amer: >60 mL/min (ref >60)
GLUCOSE: 163 mg/dL — AB (ref 70–99)
POTASSIUM: 4.2 mmol/L (ref 3.5–5.1)
SODIUM: 137 mmol/L (ref 135–145)
Total Bilirubin: 0.9 mg/dL (ref 0.3–1.2)
Total Protein: 7.7 g/dL (ref 6.5–8.1)

## 2018-05-24 LAB — LIPID PANEL
CHOLESTEROL: 113 mg/dL (ref 0–200)
HDL: 33 mg/dL — ABNORMAL LOW (ref >40)
LDL Cholesterol: 54 mg/dL (ref 0–99)
Total CHOL/HDL Ratio: 3.4 RATIO
Triglycerides: 131 mg/dL (ref <150)
VLDL: 26 mg/dL (ref 0–40)

## 2018-05-24 LAB — HEMOGLOBIN A1C
Hgb A1c MFr Bld: 9.1 % — ABNORMAL HIGH (ref 4.8–5.6)
Mean Plasma Glucose: 214.47 mg/dL

## 2018-05-24 MED ORDER — OMEPRAZOLE 40 MG PO CPDR: DELAYED_RELEASE_CAPSULE | ORAL | 1 refills | Status: DC

## 2018-05-24 MED ORDER — GABAPENTIN 300 MG PO CAPS: ORAL_CAPSULE | ORAL | 4 refills | Status: DC

## 2018-05-24 NOTE — Patient Instructions (Signed)
Estreimiento, en adultos  Constipation, Adult  Estreimiento significa que una persona defeca en una semana menos que lo normal, tiene dificultad para defecar, o las heces son secas, duras, o ms grandes que lo normal. El estreimiento podra estar provocado por una enfermedad preexistente. Puede empeorar con la edad si una persona toma ciertos medicamentos y no toma suficiente lquido.  Siga estas indicaciones en su casa:  Qu debe comer y beber     Consuma alimentos con alto contenido de fibra, como frutas y verduras frescas, cereales integrales y frijoles.   Limite los alimentos ricos en grasas y con bajo contenido de fibra, o muy procesados, como las papas fritas, hamburguesas, galletas, dulces y refrescos.   Beba suficiente lquido para mantener la orina clara o de color amarillo plido.  Instrucciones generales   Haga actividad fsica habitualmente o como se lo haya indicado el mdico.   Vaya al bao cuando sienta la necesidad de ir. No se aguante las ganas.   Tome los medicamentos de venta libre y los recetados solamente como se lo haya indicado el mdico. Estos incluyen los suplementos de fibra.   Practique ejercicios de rehabilitacin del suelo plvico, como la respiracin profunda mientras relaja la parte inferior del abdomen y la relajacin del suelo plvico mientras defeca.   Controle su afeccin para detectar cualquier cambio.   Concurra a todas las visitas de seguimiento como se lo haya indicado el mdico. Esto es importante.  Comunquese con un mdico si:   Su dolor empeora.   Tiene fiebre.   No defeca despus de 4das.   Vomita.   No tiene hambre.   Pierde peso.   Tiene una hemorragia en el ano.   Las heces son delgadas como un lpiz.  Solicite ayuda de inmediato si:   Tiene fiebre y los sntomas empeoran repentinamente.   Observa que se filtran heces o hay sangre en las heces.   Tiene el abdomen distendido.   Siente un dolor intenso en el abdomen.   Se siente mareado o se  desmaya.  Esta informacin no tiene como fin reemplazar el consejo del mdico. Asegrese de hacerle al mdico cualquier pregunta que tenga.  Document Released: 05/04/2007 Document Revised: 08/04/2016 Document Reviewed: 10/03/2015  Elsevier Interactive Patient Education  2019 [employer]

## 2018-05-24 NOTE — Progress Notes (Signed)
BP 140/70 (BP Location: Left Arm, Patient Position: Sitting, Cuff Size: Normal)   Pulse (!) 58   Temp (!) 97.3 F (36.3 C)   Ht 5' (1.524 m)   Wt 156 lb (70.8 kg)   LMP 03/05/2015   SpO2 98%   BMI 30.47 kg/m    Subjective:    Patient ID: Yesenia Hoffman, female    DOB: 07-24-1965, 53 y.o.   MRN: [id number]  HPI: Yesenia Hoffman is a 53 y.o. female presenting on 05/24/2018 for Diabetes (pt did not bring BS log. pt states she checked her FBS and was at 166); Edema (bilateral legs and hands. 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 Diabetes    pt did not bring BS log. pt states she checked her FBS and was at 166  . Edema    bilateral legs and hands. 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 Bloated    pt states she feels as if she wants to pass gas and is unable to    Pt  Did not bring bs log.  She says her fbs range 130-150 in the morning.   She says she has had no lows.  She says she has BM sometimes bid but it is hard to moves her bowels and the stool is hard.    Relevant past medical, surgical, family and social history reviewed and updated as indicated. Interim medical history since our last visit reviewed. Allergies and medications reviewed and updated.   Current Outpatient Medications:  .  amLODipine (NORVASC) 10 MG tablet, TAKE 1 TABLET BY MOUTH ONCE DAILY TOME  UNA  TABLETA  POR  BOCA  DIARIA, Disp: 90 tablet, Rfl: 1 .  aspirin 81 MG tablet, Take 1 tablet (81 mg total) by mouth daily., Disp: , Rfl:  .  atorvastatin (LIPITOR) 20 MG tablet, Take 1 tablet (20 mg total) by mouth daily., Disp: 90 tablet, Rfl: 1 .  chlorhexidine (PERIDEX) 0.12 % solution, Use as directed 15 mLs in the mouth or throat 2 (two) times daily., Disp: , Rfl:  .  fexofenadine (ALLEGRA) 180 MG tablet, Take 180 mg by mouth daily., Disp: ,  Rfl:  .  fish oil-omega-3 fatty acids 1000 MG capsule, Take 2 capsules by mouth 2 (two) times daily. , Disp: , Rfl:  .  gabapentin (NEURONTIN) 300 MG capsule, 1 po bid.  Tome una tableta por boca dos veces diarias, Disp: 180 capsule, Rfl: 1 .  glipiZIDE (GLUCOTROL) 10 MG tablet, TAKE 2 TABLETS BY MOUTH TWICE DAILY WITH  A  MEAL  TOME  DOS  TABLETAS  POR  BOCA  DOS  VECES  DIARIAS  CON  COMIDA, Disp: 360 tablet, Rfl: 1 .  insulin NPH-regular Human (NOVOLIN 70/30 RELION) (70-30) 100 UNIT/ML injection, Inject 42 units SQ before breakfast & 40 units SQ before supper.  Inyecte 42 unidades subcutaneos antes de el desayuno y inyecte 40 unidades subcutaneos antes de la cena (Patient taking differently: Inject into the skin 2 (two) times daily with a meal. Inject 55 units SQ before breakfast & 50 units SQ before supper.  Inyecte 55 unidades subcutaneos antes de el desayuno y inyecte 50 unidades subcutaneos antes de la cena), Disp: 1 vial, Rfl: 4 .  lisinopril-hydrochlorothiazide (PRINZIDE,ZESTORETIC)  20-25 MG tablet, TAKE 1 TABLET BY MOUTH ONCE DAILY TOME  UNA  TABLETA  POR  BOCA  DIARIA, Disp: 90 tablet, Rfl: 1 .  metFORMIN (GLUCOPHAGE) 1000 MG tablet, TAKE 1 TABLET BY MOUTH TWICE DAILY TOME  UNA  TABLETA  POR  BOCA  DOS  VECES  DIARIAS  CON  COMIDA, Disp: 180 tablet, Rfl: 1 .  metoprolol tartrate (LOPRESSOR) 25 MG tablet, TAKE 1/2 (ONE-HALF) TABLET BY MOUTH TWICE DAILY TOME  MEDIA  TABLETA  POR  BOCA  DOS  VECES  DIARIAS, Disp: 45 tablet, Rfl: 1 .  naproxen (NAPROSYN) 500 MG tablet, 1 po q 12 hours prn pain.  Tome una tableta por boca cada 12 horas cuando sea necesario para el dolor, Disp: 60 tablet, Rfl: 2 .  nitroGLYCERIN (NITROSTAT) 0.4 MG SL tablet, Place 1 tablet under the tongue every 5 minutes as needed for chest pain.  ponga una tableta por abajo de la lengua cada 5 minutos cuando sea necesario para el dolor de pecho, Disp: 25 tablet, Rfl: 3 .  ranitidine (ZANTAC) 150 MG tablet, Take 300 mg by mouth 2  (two) times daily., Disp: , Rfl:  .  Sennosides (TGT NATURAL LAXATIVE PILLS) 25 MG TABS, Take 16.12 mg by mouth 2 (two) times daily. , Disp: , Rfl:     Review of Systems  Constitutional: Positive for fatigue. Negative for appetite change, chills, diaphoresis, fever and unexpected weight change.  HENT: Positive for dental problem and mouth sores. Negative for congestion, ear pain, facial swelling, hearing loss, sneezing, sore throat, trouble swallowing and voice change.   Eyes: Negative for pain, discharge, redness, itching and visual disturbance.  Respiratory: Negative for cough, choking, shortness of breath and wheezing.   Cardiovascular: Positive for leg swelling. Negative for chest pain and palpitations.  Gastrointestinal: Positive for constipation. Negative for abdominal pain, blood in stool, diarrhea and vomiting.  Endocrine: Negative for cold intolerance, heat intolerance and polydipsia.  Genitourinary: Negative for decreased urine volume, dysuria and hematuria.  Musculoskeletal: Positive for arthralgias. Negative for back pain and gait problem.  Skin: Negative for rash.  Allergic/Immunologic: Positive for environmental allergies.  Neurological: Negative for seizures, syncope, light-headedness and headaches.  Hematological: Negative for adenopathy.  Psychiatric/Behavioral: Positive for agitation. Negative for dysphoric mood and suicidal ideas. The patient is not nervous/anxious.     Per HPI unless specifically indicated above     Objective:    BP 140/70 (BP Location: Left Arm, Patient Position: Sitting, Cuff Size: Normal)   Pulse (!) 58   Temp (!) 97.3 F (36.3 C)   Ht 5' (1.524 m)   Wt 156 lb (70.8 kg)   LMP 03/05/2015   SpO2 98%   BMI 30.47 kg/m   Wt Readings from Last 3 Encounters:  05/24/18 156 lb (70.8 kg)  03/22/18 154 lb (69.9 kg)  12/21/17 151 lb (68.5 kg)    Physical Exam Vitals signs reviewed.  Constitutional:      Appearance: She is well-developed.   HENT:     Head: Normocephalic and atraumatic.  Neck:     Musculoskeletal: Neck supple.  Cardiovascular:     Rate and Rhythm: Normal rate and regular rhythm.  Pulmonary:     Effort: Pulmonary effort is normal.     Breath sounds: Normal breath sounds.  Abdominal:     General: Bowel sounds are normal.     Palpations: Abdomen is soft. There is no mass.     Tenderness: There is no abdominal tenderness.  Lymphadenopathy:     Cervical: No cervical adenopathy.  Skin:    General: Skin is warm and dry.  Neurological:     Mental Status: She is alert and oriented to person, place, and time.  Psychiatric:        Mood and Affect: Mood normal.        Behavior: Behavior normal.     Results for orders placed or performed during the hospital encounter of 05/24/18  Comprehensive metabolic panel  Result Value Ref Range   Sodium 137 135 - 145 mmol/L   Potassium 4.2 3.5 - 5.1 mmol/L   Chloride 101 98 - 111 mmol/L   CO2 28 22 - 32 mmol/L   Glucose, Bld 163 (H) 70 - 99 mg/dL   BUN 11 6 - 20 mg/dL   Creatinine, Ser 0.42 (L) 0.44 - 1.00 mg/dL   Calcium 9.3 8.9 - 10.3 mg/dL   Total Protein 7.7 6.5 - 8.1 g/dL   Albumin 3.9 3.5 - 5.0 g/dL   AST 15 15 - 41 U/L   ALT 18 0 - 44 U/L   Alkaline Phosphatase 89 38 - 126 U/L   Total Bilirubin 0.9 0.3 - 1.2 mg/dL   GFR calc non Af Amer >60 >60 mL/min   GFR calc Af Amer >60 >60 mL/min   Anion gap 8 5 - 15  Lipid panel  Result Value Ref Range   Cholesterol 113 0 - 200 mg/dL   Triglycerides 131 <150 mg/dL   HDL 33 (L) >40 mg/dL   Total CHOL/HDL Ratio 3.4 RATIO   VLDL 26 0 - 40 mg/dL   LDL Cholesterol 54 0 - 99 mg/dL      Assessment & Plan:   Encounter Diagnoses  Name Primary?  Marland Kitchen Uncontrolled type 2 diabetes mellitus with complication, with long-term current use of insulin ([place]) Yes  . Essential hypertension   . Hyperlipidemia, unspecified hyperlipidemia type   . Coronary artery disease involving native coronary artery of native heart without  angina pectoris   . Diabetic polyneuropathy associated with type 2 diabetes mellitus ([place])   . Constipation, unspecified constipation type      -reviewed some labs with pt.  a1c not yet available since pt just went to lab this morning -counseled pt to Increase water to help constipation.  Also she can use OTC stool softener -recommended she try OTC gas-x  -pt has mammogram appt in April -will Change ranitidine to omeprazole due to no longer available through [facility] -pt to continue to monitor fbs.  We will call pt when her a1c result is available -pt to follow up in 3 months.  RTO sooner prn

## 2018-05-25 LAB — MICROALBUMIN, URINE: Microalb, Ur: 4.6 ug/mL — ABNORMAL HIGH

## 2018-06-07 ENCOUNTER — Other Ambulatory Visit: Payer: Self-pay | Admitting: Physician Assistant

## 2018-06-07 DIAGNOSIS — IMO0002 Reserved for concepts with insufficient information to code with codable children: Secondary | ICD-10-CM

## 2018-06-07 DIAGNOSIS — E785 Hyperlipidemia, unspecified: Secondary | ICD-10-CM

## 2018-06-07 DIAGNOSIS — E1165 Type 2 diabetes mellitus with hyperglycemia: Secondary | ICD-10-CM

## 2018-06-07 DIAGNOSIS — Z794 Long term (current) use of insulin: Principal | ICD-10-CM

## 2018-06-07 DIAGNOSIS — E118 Type 2 diabetes mellitus with unspecified complications: Principal | ICD-10-CM

## 2018-06-07 DIAGNOSIS — I10 Essential (primary) hypertension: Secondary | ICD-10-CM

## 2018-07-11 ENCOUNTER — Other Ambulatory Visit: Payer: Self-pay | Admitting: Physician Assistant

## 2018-07-20 ENCOUNTER — Other Ambulatory Visit: Payer: Self-pay | Admitting: Physician Assistant

## 2018-07-20 ENCOUNTER — Telehealth: Payer: Self-pay | Admitting: Student

## 2018-07-20 MED ORDER — LISINOPRIL-HYDROCHLOROTHIAZIDE 20-25 MG PO TABS: ORAL_TABLET | ORAL | 1 refills | Status: DC

## 2018-07-20 MED ORDER — METOPROLOL TARTRATE 50 MG PO TABS: ORAL_TABLET | ORAL | 1 refills | Status: DC

## 2018-07-20 MED ORDER — METFORMIN HCL 1000 MG PO TABS: ORAL_TABLET | ORAL | 1 refills | Status: DC

## 2018-07-20 MED ORDER — ATORVASTATIN CALCIUM 20 MG PO TABS: ORAL_TABLET | ORAL | 1 refills | Status: DC

## 2018-07-20 MED ORDER — GLIPIZIDE 10 MG PO TABS: ORAL_TABLET | ORAL | 1 refills | Status: DC

## 2018-07-20 NOTE — Telephone Encounter (Signed)
Yesenia Hoffman from Dr[facility]ol[facility] dentist office in [place], [place] called regarding pt's blood pressure medication. Dentist wanted to know if either her amlodipine or metoprolol could be changed due to one of the side effects can cause gum overgrowth. Yesenia Hoffman is requesting call back to know how to treat patient regarding her gum overgrowth. Ashley's call back number is [phone]  PA advises for patient to d/c amlodipine and increase metoprolol to 50 mg bid (currently doing metoprolol 25mg  1/2 tab bid).  LPN called Dr. Deloria Lair C. [facility]cey's office and spoke to (woman) whom will relay the message to [name]. LPN also called pt to notify of medication change. Pt verbalized understanding.

## 2018-07-29 ENCOUNTER — Other Ambulatory Visit: Payer: Self-pay | Admitting: Physician Assistant

## 2018-07-29 MED ORDER — METOPROLOL TARTRATE 50 MG PO TABS: ORAL_TABLET | ORAL | 3 refills | Status: DC

## 2018-08-05 ENCOUNTER — Ambulatory Visit (HOSPITAL_COMMUNITY): Payer: Self-pay

## 2018-08-23 ENCOUNTER — Ambulatory Visit: Payer: Self-pay | Admitting: Physician Assistant

## 2018-08-25 ENCOUNTER — Ambulatory Visit: Payer: Self-pay | Admitting: Physician Assistant

## 2018-08-25 ENCOUNTER — Encounter: Payer: Self-pay | Admitting: Physician Assistant

## 2018-08-25 DIAGNOSIS — Z794 Long term (current) use of insulin: Principal | ICD-10-CM

## 2018-08-25 DIAGNOSIS — E118 Type 2 diabetes mellitus with unspecified complications: Principal | ICD-10-CM

## 2018-08-25 DIAGNOSIS — E1142 Type 2 diabetes mellitus with diabetic polyneuropathy: Secondary | ICD-10-CM

## 2018-08-25 DIAGNOSIS — IMO0002 Reserved for concepts with insufficient information to code with codable children: Secondary | ICD-10-CM

## 2018-08-25 DIAGNOSIS — E1165 Type 2 diabetes mellitus with hyperglycemia: Secondary | ICD-10-CM

## 2018-08-25 DIAGNOSIS — Z789 Other specified health status: Secondary | ICD-10-CM

## 2018-08-25 DIAGNOSIS — E785 Hyperlipidemia, unspecified: Secondary | ICD-10-CM

## 2018-08-25 DIAGNOSIS — I10 Essential (primary) hypertension: Secondary | ICD-10-CM

## 2018-08-25 DIAGNOSIS — K219 Gastro-esophageal reflux disease without esophagitis: Secondary | ICD-10-CM

## 2018-08-25 DIAGNOSIS — I2510 Atherosclerotic heart disease of native coronary artery without angina pectoris: Secondary | ICD-10-CM

## 2018-08-25 NOTE — Progress Notes (Signed)
LMP 03/05/2015    Subjective:    Patient ID: Yesenia Hoffman, female    DOB: February 18, 1966, 53 y.o.   MRN: [id number]  HPI: Yesenia Hoffman is a 53 y.o. female presenting on 08/25/2018 for No chief complaint on file.   HPI   This is a telemedicine appointment through [employer] due to coronavirus pandemic.    I connected with  Yesenia Hoffman on 08/25/18 by a video enabled telemedicine application and verified that I am speaking with the correct person using two identifiers.   I discussed the limitations of evaluation and management by telemedicine. The patient expressed understanding and agreed to proceed.   For her diabetes, pt is using 55 units before breakfast and 50 units before supper of the 70/30 insulin-  She says she hasn't been writing down her blood sugars when she checks them.  She says it was 138 this morning.  That is a typical number for her in the morning.  She says she feels bad if it goes down to 100.   Lowest/highest 150-160, 100-110.     Amlodipine was discontinued and metoprolol was increased due to dental issues.  Pt feels like she isn't used to the medication changes.   She says she doesn't feel too good.    She feels aggitated and has been having some HA  Pt says her constipation and other GI issues are improved.   Relevant past medical, surgical, family and social history reviewed and updated as indicated. Interim medical history since our last visit reviewed. Allergies and medications reviewed and updated.   Current Outpatient Medications:  .  aspirin 81 MG tablet, Take 1 tablet (81 mg total) by mouth daily., Disp: , Rfl:  .  atorvastatin (LIPITOR) 20 MG tablet, 1 po qhs.  Tome una tableta por boca al dormir, Disp: 90 tablet, Rfl: 1 .  fexofenadine (ALLEGRA) 180 MG tablet, Take 180 mg by mouth daily., Disp: , Rfl:  .  fish oil-omega-3 fatty acids 1000 MG capsule, Take 2 capsules by mouth 2 (two) times daily. , Disp: , Rfl:  .  gabapentin (NEURONTIN) 300 MG  capsule, 1 po bid.  Tome una tableta por boca dos veces diarias, Disp: 60 capsule, Rfl: 4 .  glipiZIDE (GLUCOTROL) 10 MG tablet, TAKE 2 TABLETS BY MOUTH TWICE DAILY WITH  A  MEAL  TOME  DOS  TABLETAS  POR  BOCA  DOS  VECES  DIARIAS  CON  COMIDA, Disp: 360 tablet, Rfl: 1 .  insulin NPH-regular Human (NOVOLIN 70/30 RELION) (70-30) 100 UNIT/ML injection, Inject 42 units SQ before breakfast & 40 units SQ before supper.  Inyecte 42 unidades subcutaneos antes de el desayuno y inyecte 40 unidades subcutaneos antes de la cena (Patient taking differently: Inject into the skin 2 (two) times daily with a meal. Inject 55 units SQ before breakfast & 50 units SQ before supper.  Inyecte 55 unidades subcutaneos antes de el desayuno y inyecte 50 unidades subcutaneos antes de la cena), Disp: 1 vial, Rfl: 4 .  lisinopril-hydrochlorothiazide (PRINZIDE,ZESTORETIC) 20-25 MG tablet, TAKE 1 TABLET BY MOUTH ONCE DAILY TOME  UNA  TABLETA  POR  BOCA  DIARIA, Disp: 90 tablet, Rfl: 1 .  metFORMIN (GLUCOPHAGE) 1000 MG tablet, TAKE 1 TABLET BY MOUTH TWICE DAILY TOME  UNA  TABLETA  POR  BOCA  DOS  VECES  DIARIAS  CON  COMIDA, Disp: 180 tablet, Rfl: 1 .  metoprolol tartrate (LOPRESSOR) 50 MG tablet, 1 po bid.  Tome una tableta por boca dos veces diarias, Disp: 60 tablet, Rfl: 3 .  naproxen (NAPROSYN) 500 MG tablet, TAKE 1 TABLET BY MOUTH EVERY 12 HOURS AS NEEDED FOR PAIN. TOME UNA TABLETA POR BOCA CADA 12 HORAS CUANDO SEA NECESARIO PARA EL DOLOR, Disp: 60 tablet, Rfl: 0 .  omeprazole (PRILOSEC) 40 MG capsule, 1 po qd .  Tome una tableta por boca diaria, Disp: 90 capsule, Rfl: 1 .  Sennosides (TGT NATURAL LAXATIVE PILLS) 25 MG TABS, Take 16.12 mg by mouth 2 (two) times daily. , Disp: , Rfl:  .  nitroGLYCERIN (NITROSTAT) 0.4 MG SL tablet, Place 1 tablet under the tongue every 5 minutes as needed for chest pain.  ponga una tableta por abajo de la lengua cada 5 minutos cuando sea necesario para el dolor de pecho (Patient not taking: Reported  on 08/25/2018), Disp: 25 tablet, Rfl: 3   Review of Systems  Per HPI unless specifically indicated above     Objective:    LMP 03/05/2015   Wt Readings from Last 3 Encounters:  05/24/18 156 lb (70.8 kg)  03/22/18 154 lb (69.9 kg)  12/21/17 151 lb (68.5 kg)    Physical Exam Constitutional:      General: She is not in acute distress.    Appearance: She is not ill-appearing.  HENT:     Head: Normocephalic and atraumatic.  Pulmonary:     Effort: Pulmonary effort is normal. No respiratory distress.  Neurological:     Mental Status: She is alert and oriented to person, place, and time.  Psychiatric:        Attention and Perception: Attention normal.        Mood and Affect: Mood normal.        Speech: Speech normal.        Behavior: Behavior normal.           Assessment & Plan:     Encounter Diagnoses  Name Primary?  Marland Kitchen Uncontrolled type 2 diabetes mellitus with complication, with long-term current use of insulin ([place]) Yes  . Essential hypertension   . Hyperlipidemia, unspecified hyperlipidemia type   . Coronary artery disease involving native coronary artery of native heart without angina pectoris   . Diabetic polyneuropathy associated with type 2 diabetes mellitus ([place])   . Gastroesophageal reflux disease, esophagitis presence not specified   . Non-English speaking patient      -pt to continue current medications -she is encouraged to monitor bs and record her readings -labs deferred at this time due to CV19 -pt will come into the office tomorrow to have her BP evaluated

## 2018-08-26 ENCOUNTER — Other Ambulatory Visit: Payer: Self-pay

## 2018-08-26 ENCOUNTER — Encounter: Payer: Self-pay | Admitting: Physician Assistant

## 2018-08-26 ENCOUNTER — Ambulatory Visit: Payer: Self-pay | Admitting: Physician Assistant

## 2018-08-26 VITALS — BP 150/76 | HR 58 | Temp 97.7°F

## 2018-08-26 DIAGNOSIS — I2510 Atherosclerotic heart disease of native coronary artery without angina pectoris: Secondary | ICD-10-CM

## 2018-08-26 DIAGNOSIS — Z789 Other specified health status: Secondary | ICD-10-CM

## 2018-08-26 DIAGNOSIS — IMO0002 Reserved for concepts with insufficient information to code with codable children: Secondary | ICD-10-CM

## 2018-08-26 DIAGNOSIS — K219 Gastro-esophageal reflux disease without esophagitis: Secondary | ICD-10-CM

## 2018-08-26 DIAGNOSIS — E785 Hyperlipidemia, unspecified: Secondary | ICD-10-CM

## 2018-08-26 DIAGNOSIS — I10 Essential (primary) hypertension: Secondary | ICD-10-CM

## 2018-08-26 DIAGNOSIS — E118 Type 2 diabetes mellitus with unspecified complications: Secondary | ICD-10-CM

## 2018-08-26 DIAGNOSIS — E1165 Type 2 diabetes mellitus with hyperglycemia: Secondary | ICD-10-CM

## 2018-08-26 DIAGNOSIS — E1142 Type 2 diabetes mellitus with diabetic polyneuropathy: Secondary | ICD-10-CM

## 2018-08-26 DIAGNOSIS — Z794 Long term (current) use of insulin: Secondary | ICD-10-CM

## 2018-08-26 MED ORDER — METOPROLOL TARTRATE 100 MG PO TABS: ORAL_TABLET | ORAL | 3 refills | Status: DC

## 2018-08-26 NOTE — Progress Notes (Signed)
BP (!) 150/76   Pulse (!) 58   Temp 97.7 F (36.5 C)   LMP 03/05/2015   SpO2 97%    Subjective:    Patient ID: Yesenia Hoffman, female    DOB: 06/20/1965, 53 y.o.   MRN: [id number]  HPI: Yesenia Hoffman is a 53 y.o. female presenting on 08/26/2018 for Hypertension   HPI   Pt is here for follow up on bp after yesterdays telemedicine appointment.  She is not having any HA, cp or vision changes.   Relevant past medical, surgical, family and social history reviewed and updated as indicated. Interim medical history since our last visit reviewed. Allergies and medications reviewed and updated.   Current Outpatient Medications:  .  aspirin 81 MG tablet, Take 1 tablet (81 mg total) by mouth daily., Disp: , Rfl:  .  atorvastatin (LIPITOR) 20 MG tablet, 1 po qhs.  Tome una tableta por boca al dormir, Disp: 90 tablet, Rfl: 1 .  fexofenadine (ALLEGRA) 180 MG tablet, Take 180 mg by mouth daily., Disp: , Rfl:  .  fish oil-omega-3 fatty acids 1000 MG capsule, Take 2 capsules by mouth 2 (two) times daily. , Disp: , Rfl:  .  gabapentin (NEURONTIN) 300 MG capsule, 1 po bid.  Tome una tableta por boca dos veces diarias, Disp: 60 capsule, Rfl: 4 .  glipiZIDE (GLUCOTROL) 10 MG tablet, TAKE 2 TABLETS BY MOUTH TWICE DAILY WITH  A  MEAL  TOME  DOS  TABLETAS  POR  BOCA  DOS  VECES  DIARIAS  CON  COMIDA, Disp: 360 tablet, Rfl: 1 .  insulin NPH-regular Human (NOVOLIN 70/30 RELION) (70-30) 100 UNIT/ML injection, Inject 42 units SQ before breakfast & 40 units SQ before supper.  Inyecte 42 unidades subcutaneos antes de el desayuno y inyecte 40 unidades subcutaneos antes de la cena (Patient taking differently: Inject into the skin 2 (two) times daily with a meal. Inject 55 units SQ before breakfast & 50 units SQ before supper.  Inyecte 55 unidades subcutaneos antes de el desayuno y inyecte 50 unidades subcutaneos antes de la cena), Disp: 1 vial, Rfl: 4 .  lisinopril-hydrochlorothiazide (PRINZIDE,ZESTORETIC) 20-25  MG tablet, TAKE 1 TABLET BY MOUTH ONCE DAILY TOME  UNA  TABLETA  POR  BOCA  DIARIA, Disp: 90 tablet, Rfl: 1 .  metFORMIN (GLUCOPHAGE) 1000 MG tablet, TAKE 1 TABLET BY MOUTH TWICE DAILY TOME  UNA  TABLETA  POR  BOCA  DOS  VECES  DIARIAS  CON  COMIDA, Disp: 180 tablet, Rfl: 1 .  metoprolol tartrate (LOPRESSOR) 50 MG tablet, 1 po bid.  Tome una tableta por boca dos veces diarias, Disp: 60 tablet, Rfl: 3 .  naproxen (NAPROSYN) 500 MG tablet, TAKE 1 TABLET BY MOUTH EVERY 12 HOURS AS NEEDED FOR PAIN. TOME UNA TABLETA POR BOCA CADA 12 HORAS CUANDO SEA NECESARIO PARA EL DOLOR, Disp: 60 tablet, Rfl: 0 .  nitroGLYCERIN (NITROSTAT) 0.4 MG SL tablet, Place 1 tablet under the tongue every 5 minutes as needed for chest pain.  ponga una tableta por abajo de la lengua cada 5 minutos cuando sea necesario para el dolor de pecho, Disp: 25 tablet, Rfl: 3 .  omeprazole (PRILOSEC) 40 MG capsule, 1 po qd .  Tome una tableta por boca diaria, Disp: 90 capsule, Rfl: 1 .  Sennosides (TGT NATURAL LAXATIVE PILLS) 25 MG TABS, Take 16.12 mg by mouth 2 (two) times daily. , Disp: , Rfl:    Review of Systems  Per  HPI unless specifically indicated above     Objective:    BP (!) 150/76   Pulse (!) 58   Temp 97.7 F (36.5 C)   LMP 03/05/2015   SpO2 97%   Wt Readings from Last 3 Encounters:  05/24/18 156 lb (70.8 kg)  03/22/18 154 lb (69.9 kg)  12/21/17 151 lb (68.5 kg)    Physical Exam Vitals signs reviewed.  Constitutional:      Appearance: She is well-developed.  HENT:     Head: Normocephalic and atraumatic.  Neck:     Musculoskeletal: Neck supple.  Cardiovascular:     Rate and Rhythm: Normal rate and regular rhythm.  Pulmonary:     Effort: Pulmonary effort is normal.     Breath sounds: Normal breath sounds.  Abdominal:     General: Bowel sounds are normal.     Palpations: Abdomen is soft. There is no mass.     Tenderness: There is no abdominal tenderness.  Lymphadenopathy:     Cervical: No cervical  adenopathy.  Skin:    General: Skin is warm and dry.  Neurological:     Mental Status: She is alert and oriented to person, place, and time.  Psychiatric:        Behavior: Behavior normal.         Assessment & Plan:    Encounter Diagnoses  Name Primary?  . Essential hypertension Yes  . Uncontrolled type 2 diabetes mellitus with complication, with long-term current use of insulin ([place])   . Hyperlipidemia, unspecified hyperlipidemia type   . Coronary artery disease involving native coronary artery of native heart without angina pectoris   . Diabetic polyneuropathy associated with type 2 diabetes mellitus ([place])   . Gastroesophageal reflux disease, esophagitis presence not specified   . Non-English speaking patient     -Increase metoprolol to 100mg  bid -pt is to continue her other medications -pt is to Follow up 6 wk to recheck the bp.  Pt to contact office sooner if she has any problems or feels badly

## 2018-08-31 ENCOUNTER — Other Ambulatory Visit (HOSPITAL_COMMUNITY): Payer: Self-pay | Admitting: *Deleted

## 2018-08-31 DIAGNOSIS — N644 Mastodynia: Secondary | ICD-10-CM

## 2018-09-23 ENCOUNTER — Ambulatory Visit (HOSPITAL_COMMUNITY): Payer: Self-pay

## 2018-09-23 ENCOUNTER — Other Ambulatory Visit: Payer: Self-pay

## 2018-10-18 ENCOUNTER — Other Ambulatory Visit (HOSPITAL_COMMUNITY)
Admission: RE | Admit: 2018-10-18 | Discharge: 2018-10-18 | Disposition: A | Discharge status: Home / Self Care | Payer: Self-pay | Source: Ambulatory Visit | Attending: Physician Assistant | Admitting: Physician Assistant

## 2018-10-18 ENCOUNTER — Ambulatory Visit: Payer: Self-pay | Admitting: Physician Assistant

## 2018-10-18 ENCOUNTER — Other Ambulatory Visit: Payer: Self-pay

## 2018-10-18 ENCOUNTER — Encounter: Payer: Self-pay | Admitting: Physician Assistant

## 2018-10-18 VITALS — BP 160/82 | HR 64 | Temp 97.9°F | Wt 154.5 lb

## 2018-10-18 DIAGNOSIS — E1165 Type 2 diabetes mellitus with hyperglycemia: Secondary | ICD-10-CM | POA: Insufficient documentation

## 2018-10-18 DIAGNOSIS — E785 Hyperlipidemia, unspecified: Secondary | ICD-10-CM

## 2018-10-18 DIAGNOSIS — IMO0002 Reserved for concepts with insufficient information to code with codable children: Secondary | ICD-10-CM

## 2018-10-18 DIAGNOSIS — Z1211 Encounter for screening for malignant neoplasm of colon: Secondary | ICD-10-CM

## 2018-10-18 DIAGNOSIS — I2510 Atherosclerotic heart disease of native coronary artery without angina pectoris: Secondary | ICD-10-CM

## 2018-10-18 DIAGNOSIS — E118 Type 2 diabetes mellitus with unspecified complications: Secondary | ICD-10-CM | POA: Insufficient documentation

## 2018-10-18 DIAGNOSIS — E1142 Type 2 diabetes mellitus with diabetic polyneuropathy: Secondary | ICD-10-CM

## 2018-10-18 DIAGNOSIS — I10 Essential (primary) hypertension: Secondary | ICD-10-CM

## 2018-10-18 DIAGNOSIS — Z789 Other specified health status: Secondary | ICD-10-CM

## 2018-10-18 DIAGNOSIS — Z794 Long term (current) use of insulin: Secondary | ICD-10-CM | POA: Insufficient documentation

## 2018-10-18 LAB — COMPREHENSIVE METABOLIC PANEL
ALT: 14 U/L (ref 0–44)
AST: 14 U/L — ABNORMAL LOW (ref 15–41)
Albumin: 3.9 g/dL (ref 3.5–5.0)
Alkaline Phosphatase: 80 U/L (ref 38–126)
Anion gap: 10 (ref 5–15)
BUN: 14 mg/dL (ref 6–20)
CO2: 30 mmol/L (ref 22–32)
Calcium: 9.2 mg/dL (ref 8.9–10.3)
Chloride: 99 mmol/L (ref 98–111)
Creatinine, Ser: 0.41 mg/dL — ABNORMAL LOW (ref 0.44–1.00)
GFR calc Af Amer: >60 mL/min (ref >60)
GFR calc non Af Amer: >60 mL/min (ref >60)
Glucose, Bld: 131 mg/dL — ABNORMAL HIGH (ref 70–99)
Potassium: 4 mmol/L (ref 3.5–5.1)
Sodium: 139 mmol/L (ref 135–145)
Total Bilirubin: 0.5 mg/dL (ref 0.3–1.2)
Total Protein: 7.4 g/dL (ref 6.5–8.1)

## 2018-10-18 LAB — LIPID PANEL
Cholesterol: 90 mg/dL (ref 0–200)
HDL: 25 mg/dL — ABNORMAL LOW (ref >40)
LDL Cholesterol: 51 mg/dL (ref 0–99)
Total CHOL/HDL Ratio: 3.6 RATIO
Triglycerides: 71 mg/dL (ref <150)
VLDL: 14 mg/dL (ref 0–40)

## 2018-10-18 MED ORDER — AMLODIPINE BESYLATE 5 MG PO TABS: ORAL_TABLET | ORAL | 1 refills | Status: DC

## 2018-10-18 MED ORDER — CLONIDINE HCL 0.1 MG PO TABS
0.1000 mg | ORAL_TABLET | Freq: Once | ORAL | Status: AC
  Administered 2018-10-18: 0.1 mg via ORAL

## 2018-10-18 MED ORDER — GABAPENTIN 300 MG PO CAPS: ORAL_CAPSULE | ORAL | 4 refills | Status: DC

## 2018-10-18 MED ORDER — METOPROLOL TARTRATE 100 MG PO TABS: ORAL_TABLET | ORAL | 1 refills | Status: DC

## 2018-10-18 NOTE — Progress Notes (Signed)
BP (!) 174/80   Pulse 64   Temp 97.9 F (36.6 C)   Wt 154 lb 8 oz (70.1 kg)   LMP 03/05/2015   SpO2 95%   BMI 30.17 kg/m    Subjective:    Patient ID: Yesenia Hoffman, female    DOB: 28-Aug-1965, 53 y.o.   MRN: [id number]  HPI: Yesenia Hoffman is a 53 y.o. female presenting on 10/18/2018 for Hypertension and Diabetes   HPI   Pt had negative screeningquestionnairefor CV19   Pt works at [employer] in [place]  Pt just got labs this morning   She says she is taking her meds.  For her BP, she is taking:  Lisinopril/hctz 20/25 qd Metoprolol 100mg   bid  Pt has not been following up with cardiology-  She hasn't seen since 01/03/16     Relevant past medical, surgical, family and social history reviewed and updated as indicated. Interim medical history since our last visit reviewed. Allergies and medications reviewed and updated.   Current Outpatient Medications:  .  aspirin 81 MG tablet, Take 1 tablet (81 mg total) by mouth daily., Disp: , Rfl:  .  atorvastatin (LIPITOR) 20 MG tablet, 1 po qhs.  Tome una tableta por boca al dormir, Disp: 90 tablet, Rfl: 1 .  fexofenadine (ALLEGRA) 180 MG tablet, Take 180 mg by mouth daily., Disp: , Rfl:  .  fish oil-omega-3 fatty acids 1000 MG capsule, Take 2 capsules by mouth 2 (two) times daily. , Disp: , Rfl:  .  glipiZIDE (GLUCOTROL) 10 MG tablet, TAKE 2 TABLETS BY MOUTH TWICE DAILY WITH  A  MEAL  TOME  DOS  TABLETAS  POR  BOCA  DOS  VECES  DIARIAS  CON  COMIDA, Disp: 360 tablet, Rfl: 1 .  insulin NPH-regular Human (NOVOLIN 70/30 RELION) (70-30) 100 UNIT/ML injection, Inject 42 units SQ before breakfast & 40 units SQ before supper.  Inyecte 42 unidades subcutaneos antes de el desayuno y inyecte 40 unidades subcutaneos antes de la cena (Patient taking differently: Inject into the skin 2 (two) times daily with a meal. Inject 50 units SQ before breakfast & 50 units SQ before supper.  Inyecte 50 unidades subcutaneos antes de el  desayuno y inyecte 50 unidades subcutaneos antes de la cena), Disp: 1 vial, Rfl: 4 .  lisinopril-hydrochlorothiazide (PRINZIDE,ZESTORETIC) 20-25 MG tablet, TAKE 1 TABLET BY MOUTH ONCE DAILY TOME  UNA  TABLETA  POR  BOCA  DIARIA, Disp: 90 tablet, Rfl: 1 .  metFORMIN (GLUCOPHAGE) 1000 MG tablet, TAKE 1 TABLET BY MOUTH TWICE DAILY TOME  UNA  TABLETA  POR  BOCA  DOS  VECES  DIARIAS  CON  COMIDA, Disp: 180 tablet, Rfl: 1 .  metoprolol tartrate (LOPRESSOR) 100 MG tablet, 1 po bid.  Tome una tableta por boca dos veces diarias, Disp: 60 tablet, Rfl: 3 .  naproxen (NAPROSYN) 500 MG tablet, TAKE 1 TABLET BY MOUTH EVERY 12 HOURS AS NEEDED FOR PAIN. TOME UNA TABLETA POR BOCA CADA 12 HORAS CUANDO SEA NECESARIO PARA EL DOLOR, Disp: 60 tablet, Rfl: 0 .  nitroGLYCERIN (NITROSTAT) 0.4 MG SL tablet, Place 1 tablet under the tongue every 5 minutes as needed for chest pain.  ponga una tableta por abajo de la lengua cada 5 minutos cuando sea necesario para el dolor de pecho, Disp: 25 tablet, Rfl: 3 .  omeprazole (PRILOSEC) 40 MG capsule, 1 po qd .  Tome una tableta por boca diaria, Disp: 90 capsule, Rfl: 1 .  Sennosides (TGT NATURAL LAXATIVE PILLS) 25 MG TABS, Take 16.12 mg by mouth 2 (two) times daily. , Disp: , Rfl:  .  gabapentin (NEURONTIN) 300 MG capsule, 1 po bid.  Tome una tableta por boca dos veces diarias (Patient not taking: Reported on 10/18/2018), Disp: 60 capsule, Rfl: 4     Review of Systems  Per HPI unless specifically indicated above     Objective:    BP (!) 174/80   Pulse 64   Temp 97.9 F (36.6 C)   Wt 154 lb 8 oz (70.1 kg)   LMP 03/05/2015   SpO2 95%   BMI 30.17 kg/m   Wt Readings from Last 3 Encounters:  10/18/18 154 lb 8 oz (70.1 kg)  05/24/18 156 lb (70.8 kg)  03/22/18 154 lb (69.9 kg)    Physical Exam Vitals signs reviewed.  Constitutional:      Appearance: She is well-developed.  HENT:     Head: Normocephalic and atraumatic.  Neck:     Musculoskeletal: Neck supple.   Cardiovascular:     Rate and Rhythm: Normal rate and regular rhythm.  Pulmonary:     Effort: Pulmonary effort is normal.     Breath sounds: Normal breath sounds.  Abdominal:     General: Bowel sounds are normal.     Palpations: Abdomen is soft. There is no mass.     Tenderness: There is no abdominal tenderness.  Musculoskeletal:     Right lower leg: No edema.     Left lower leg: No edema.  Lymphadenopathy:     Cervical: No cervical adenopathy.  Skin:    General: Skin is warm and dry.  Neurological:     Mental Status: She is alert and oriented to person, place, and time.  Psychiatric:        Behavior: Behavior normal.         Assessment & Plan:    Encounter Diagnoses  Name Primary?  . Essential hypertension Yes  . Uncontrolled type 2 diabetes mellitus with complication, with long-term current use of insulin ([place])   . Hyperlipidemia, unspecified hyperlipidemia type   . Coronary artery disease involving native coronary artery of native heart without angina pectoris   . Non-English speaking patient   . Diabetic polyneuropathy associated with type 2 diabetes mellitus ([place])   . Screening for colon cancer      -pt was given Clonidine 0.1 mg given 11:26 and repeated at 11:44am while in the office -pt will continue her current BP medications and add amlodipine.   -will order renal US in light of difficult to control BP and her multiple medical problems  -She has appointment for Mammogram  -Pt was given ifobt for colon cancer screening  -pt was given [facility] application -Discussed with pt she needs to follow up with cardiology as she has not seen them in 3 years.  She is given the telephone number so that she can call  -Discussed with pt that it's about time for her to learn some basic English as it is affecting her health (she can't understand voicemails, etc).  She has lived here for 17 years.  Pt agrees  -pt encouraged to wear mask when in public per CDC  guidelines  -pt to follow up in 1 month to recheck the blood pressure.  Will review her labs with her at that time as they are currently unavailable.  Pt to contact office sooner

## 2018-10-18 NOTE — Patient Instructions (Addendum)
Contact cardiology for follow up [phone]  Comuniquese con el cardiologo para hacer su cita [phone]

## 2018-10-19 LAB — HEMOGLOBIN A1C
Hgb A1c MFr Bld: 8.5 % — ABNORMAL HIGH (ref 4.8–5.6)
Mean Plasma Glucose: 197 mg/dL

## 2018-10-22 ENCOUNTER — Ambulatory Visit (HOSPITAL_COMMUNITY)
Admission: RE | Admit: 2018-10-22 | Discharge: 2018-10-22 | Disposition: A | Discharge status: Home / Self Care | Payer: Self-pay | Source: Ambulatory Visit | Attending: Physician Assistant | Admitting: Physician Assistant

## 2018-10-22 ENCOUNTER — Other Ambulatory Visit: Payer: Self-pay

## 2018-10-22 DIAGNOSIS — I10 Essential (primary) hypertension: Secondary | ICD-10-CM | POA: Insufficient documentation

## 2018-11-02 ENCOUNTER — Other Ambulatory Visit: Payer: Self-pay

## 2018-11-02 ENCOUNTER — Ambulatory Visit (HOSPITAL_COMMUNITY): Payer: Self-pay

## 2018-11-05 ENCOUNTER — Other Ambulatory Visit: Payer: Self-pay | Admitting: Physician Assistant

## 2018-11-05 DIAGNOSIS — Z1239 Encounter for other screening for malignant neoplasm of breast: Secondary | ICD-10-CM

## 2018-11-05 DIAGNOSIS — Z1211 Encounter for screening for malignant neoplasm of colon: Secondary | ICD-10-CM

## 2018-11-15 ENCOUNTER — Other Ambulatory Visit: Payer: Self-pay

## 2018-11-15 ENCOUNTER — Other Ambulatory Visit: Payer: Self-pay | Admitting: Physician Assistant

## 2018-11-15 ENCOUNTER — Encounter: Payer: Self-pay | Admitting: Physician Assistant

## 2018-11-15 ENCOUNTER — Ambulatory Visit: Payer: Self-pay | Admitting: Physician Assistant

## 2018-11-15 VITALS — BP 102/68 | HR 51 | Temp 97.9°F | Wt 152.0 lb

## 2018-11-15 DIAGNOSIS — IMO0002 Reserved for concepts with insufficient information to code with codable children: Secondary | ICD-10-CM

## 2018-11-15 DIAGNOSIS — I10 Essential (primary) hypertension: Secondary | ICD-10-CM

## 2018-11-15 DIAGNOSIS — Z789 Other specified health status: Secondary | ICD-10-CM

## 2018-11-15 DIAGNOSIS — E785 Hyperlipidemia, unspecified: Secondary | ICD-10-CM

## 2018-11-15 DIAGNOSIS — I2510 Atherosclerotic heart disease of native coronary artery without angina pectoris: Secondary | ICD-10-CM

## 2018-11-15 DIAGNOSIS — E1142 Type 2 diabetes mellitus with diabetic polyneuropathy: Secondary | ICD-10-CM

## 2018-11-15 DIAGNOSIS — E1165 Type 2 diabetes mellitus with hyperglycemia: Secondary | ICD-10-CM

## 2018-11-15 DIAGNOSIS — K219 Gastro-esophageal reflux disease without esophagitis: Secondary | ICD-10-CM

## 2018-11-15 MED ORDER — NOVOLIN 70/30 RELION (70-30) 100 UNIT/ML ~~LOC~~ SUSP: 50.0000 [IU] | Freq: Two times a day (BID) | SUBCUTANEOUS | Status: AC

## 2018-11-15 NOTE — Progress Notes (Signed)
BP 102/68   Pulse (!) 51   Temp 97.9 F (36.6 C)   Wt 152 lb (68.9 kg)   LMP 03/05/2015   SpO2 96%   BMI 29.69 kg/m    Subjective:    Patient ID: Yesenia Hoffman, female    DOB: 1965-07-31, 53 y.o.   MRN: [id number]  HPI: EZELLA Hoffman is a 53 y.o. female presenting on 11/15/2018 for Hypertension   HPI  Pt had a negative covid 82 screening questionnaire   Pt has still got no appointment with cardiology for her CAD.  This was discussed at her last OV.    She hasn't been seen by cardiology since 01/03/16.  She says she calls but says they hang up on her.  She says she is not currently having CP, SOB.    Asked pt if she had turned in her [facility] application and she says she can't talk with anyone because the hospital won't allow her translator to go in.    Discussed with pt that the hospital has their own translators.  But, as discussed at last OV, she has lived in [place] for 17 years and her failure to learn English is affecting her healthcare so she should try to start learning the language.  She says she understands.     She says she is feeling well and has no complaints   Relevant past medical, surgical, family and social history reviewed and updated as indicated. Interim medical history since our last visit reviewed. Allergies and medications reviewed and updated.   Current Outpatient Medications:  .  amLODipine (NORVASC) 5 MG tablet, 1 po qd.  Tome una tableta por boca diaria, Disp: 90 tablet, Rfl: 1 .  aspirin 81 MG tablet, Take 1 tablet (81 mg total) by mouth daily., Disp: , Rfl:  .  atorvastatin (LIPITOR) 20 MG tablet, 1 po qhs.  Tome una tableta por boca al dormir, Disp: 90 tablet, Rfl: 1 .  fexofenadine (ALLEGRA) 180 MG tablet, Take 180 mg by mouth daily., Disp: , Rfl:  .  fish oil-omega-3 fatty acids 1000 MG capsule, Take 2 capsules by mouth 2 (two) times daily. , Disp: , Rfl:  .  gabapentin (NEURONTIN) 300 MG capsule, 1 po bid.  Tome una tableta por boca  dos veces diarias, Disp: 60 capsule, Rfl: 4 .  glipiZIDE (GLUCOTROL) 10 MG tablet, TAKE 2 TABLETS BY MOUTH TWICE DAILY WITH  A  MEAL  TOME  DOS  TABLETAS  POR  BOCA  DOS  VECES  DIARIAS  CON  COMIDA, Disp: 360 tablet, Rfl: 1 .  insulin NPH-regular Human (NOVOLIN 70/30 RELION) (70-30) 100 UNIT/ML injection, Inject 42 units SQ before breakfast & 40 units SQ before supper.  Inyecte 42 unidades subcutaneos antes de el desayuno y inyecte 40 unidades subcutaneos antes de la cena (Patient taking differently: Inject 50 Units into the skin 2 (two) times daily with a meal. Inject 50 units SQ before breakfast & 50 units SQ before supper.  Inyecte 50 unidades subcutaneos antes de el desayuno y inyecte 50 unidades subcutaneos antes de la cena), Disp: 1 vial, Rfl: 4 .  lisinopril-hydrochlorothiazide (PRINZIDE,ZESTORETIC) 20-25 MG tablet, TAKE 1 TABLET BY MOUTH ONCE DAILY TOME  UNA  TABLETA  POR  BOCA  DIARIA, Disp: 90 tablet, Rfl: 1 .  metFORMIN (GLUCOPHAGE) 1000 MG tablet, TAKE 1 TABLET BY MOUTH TWICE DAILY TOME  UNA  TABLETA  POR  BOCA  DOS  VECES  DIARIAS  CON  COMIDA, Disp: 180 tablet, Rfl: 1 .  metoprolol tartrate (LOPRESSOR) 100 MG tablet, 1 po bid.  Tome una tableta por boca dos veces diarias, Disp: 180 tablet, Rfl: 1 .  naproxen (NAPROSYN) 500 MG tablet, TAKE 1 TABLET BY MOUTH EVERY 12 HOURS AS NEEDED FOR PAIN. TOME UNA TABLETA POR BOCA CADA 12 HORAS CUANDO SEA NECESARIO PARA EL DOLOR, Disp: 60 tablet, Rfl: 0 .  nitroGLYCERIN (NITROSTAT) 0.4 MG SL tablet, Place 1 tablet under the tongue every 5 minutes as needed for chest pain.  ponga una tableta por abajo de la lengua cada 5 minutos cuando sea necesario para el dolor de pecho, Disp: 25 tablet, Rfl: 3 .  omeprazole (PRILOSEC) 40 MG capsule, 1 po qd .  Tome una tableta por boca diaria, Disp: 90 capsule, Rfl: 1 .  Sennosides (TGT NATURAL LAXATIVE PILLS) 25 MG TABS, Take 16.12 mg by mouth 2 (two) times daily. , Disp: , Rfl:     Review of Systems  Per HPI  unless specifically indicated above     Objective:    BP 102/68   Pulse (!) 51   Temp 97.9 F (36.6 C)   Wt 152 lb (68.9 kg)   LMP 03/05/2015   SpO2 96%   BMI 29.69 kg/m   Wt Readings from Last 3 Encounters:  11/15/18 152 lb (68.9 kg)  10/18/18 154 lb 8 oz (70.1 kg)  05/24/18 156 lb (70.8 kg)    Physical Exam Vitals signs reviewed.  Constitutional:      General: She is not in acute distress.    Appearance: Normal appearance. She is well-developed. She is not ill-appearing.  HENT:     Head: Normocephalic and atraumatic.  Neck:     Musculoskeletal: Neck supple.  Cardiovascular:     Rate and Rhythm: Normal rate and regular rhythm.  Pulmonary:     Effort: Pulmonary effort is normal.     Breath sounds: Normal breath sounds.  Abdominal:     General: Bowel sounds are normal.     Palpations: Abdomen is soft. There is no mass.     Tenderness: There is no abdominal tenderness.  Lymphadenopathy:     Cervical: No cervical adenopathy.  Skin:    General: Skin is warm and dry.  Neurological:     Mental Status: She is alert and oriented to person, place, and time.  Psychiatric:        Attention and Perception: Attention normal.        Speech: Speech normal.        Behavior: Behavior is cooperative.     Results for orders placed or performed during the hospital encounter of 10/18/18  Lipid panel  Result Value Ref Range   Cholesterol 90 0 - 200 mg/dL   Triglycerides 71 <150 mg/dL   HDL 25 (L) >40 mg/dL   Total CHOL/HDL Ratio 3.6 RATIO   VLDL 14 0 - 40 mg/dL   LDL Cholesterol 51 0 - 99 mg/dL  Comprehensive metabolic panel  Result Value Ref Range   Sodium 139 135 - 145 mmol/L   Potassium 4.0 3.5 - 5.1 mmol/L   Chloride 99 98 - 111 mmol/L   CO2 30 22 - 32 mmol/L   Glucose, Bld 131 (H) 70 - 99 mg/dL   BUN 14 6 - 20 mg/dL   Creatinine, Ser 0.41 (L) 0.44 - 1.00 mg/dL   Calcium 9.2 8.9 - 10.3 mg/dL   Total Protein 7.4 6.5 - 8.1 g/dL  Albumin 3.9 3.5 - 5.0 g/dL   AST 14  (L) 15 - 41 U/L   ALT 14 0 - 44 U/L   Alkaline Phosphatase 80 38 - 126 U/L   Total Bilirubin 0.5 0.3 - 1.2 mg/dL   GFR calc non Af Amer >60 >60 mL/min   GFR calc Af Amer >60 >60 mL/min   Anion gap 10 5 - 15  Hemoglobin A1c  Result Value Ref Range   Hgb A1c MFr Bld 8.5 (H) 4.8 - 5.6 %   Mean Plasma Glucose 197 mg/dL      Assessment & Plan:    Encounter Diagnoses  Name Primary?  Marland Kitchen Uncontrolled type 2 diabetes mellitus with complication, with long-term current use of insulin ([employer]) Yes  . Essential hypertension   . Coronary artery disease involving native coronary artery of native heart without angina pectoris   . Hyperlipidemia, unspecified hyperlipidemia type   . Non-English speaking patient   . Diabetic polyneuropathy associated with type 2 diabetes mellitus ([place])   . Gastroesophageal reflux disease, esophagitis presence not specified      -reviewed labs with pt.  Reviewed renal US with pt  -pt to continue current medications.  She is counseled to watch diabetic diet and contact office for fbs < 70 or > 300  -our nurse called and scheduled pt appointment with cardiology for her  -pt it encouraged to work on getting [facility] application completed  -she has appointment for mammogram later this week  -sHe is encouraged to continue to wear a mask when in public and avoid large gatherings to reduce risk on contracting covid 19  -pt to follow up 3 months.  She is to contact office sooner prn

## 2018-11-15 NOTE — Patient Instructions (Signed)
Clerance Lav [phone]

## 2018-11-16 ENCOUNTER — Other Ambulatory Visit: Payer: Self-pay | Admitting: Physician Assistant

## 2018-11-16 DIAGNOSIS — Z1211 Encounter for screening for malignant neoplasm of colon: Secondary | ICD-10-CM

## 2018-11-16 LAB — IFOBT (OCCULT BLOOD): IFOBT: NEGATIVE

## 2018-11-17 ENCOUNTER — Ambulatory Visit (HOSPITAL_COMMUNITY): Payer: Self-pay

## 2018-11-24 ENCOUNTER — Inpatient Hospital Stay (HOSPITAL_COMMUNITY): Admission: RE | Admit: 2018-11-24 | Payer: Self-pay | Source: Ambulatory Visit

## 2018-11-29 ENCOUNTER — Encounter: Payer: Self-pay | Admitting: Cardiology

## 2018-11-29 ENCOUNTER — Ambulatory Visit (INDEPENDENT_AMBULATORY_CARE_PROVIDER_SITE_OTHER): Payer: Self-pay | Admitting: Cardiology

## 2018-11-29 ENCOUNTER — Other Ambulatory Visit: Payer: Self-pay

## 2018-11-29 VITALS — BP 170/90 | HR 62 | Temp 98.7°F | Ht 60.0 in | Wt 156.0 lb

## 2018-11-29 DIAGNOSIS — E782 Mixed hyperlipidemia: Secondary | ICD-10-CM

## 2018-11-29 DIAGNOSIS — E1165 Type 2 diabetes mellitus with hyperglycemia: Secondary | ICD-10-CM

## 2018-11-29 DIAGNOSIS — I10 Essential (primary) hypertension: Secondary | ICD-10-CM

## 2018-11-29 DIAGNOSIS — R011 Cardiac murmur, unspecified: Secondary | ICD-10-CM

## 2018-11-29 DIAGNOSIS — I25119 Atherosclerotic heart disease of native coronary artery with unspecified angina pectoris: Secondary | ICD-10-CM

## 2018-11-29 NOTE — Patient Instructions (Signed)

## 2018-11-29 NOTE — Progress Notes (Signed)
Cardiology Office Note  Date: 11/29/2018   ID: Maicey, Barrientez 05-Apr-1966, MRN [id number]  PCP:  Soyla Dryer, PA-C  Consulting Cardiologist:  Satira Sark, MD Electrophysiologist:  None   Chief Complaint  Patient presents with   History of CAD    History of Present Illness: GLORA HULGAN is a 53 y.o. female referred for cardiology consultation by Ms. McElroy PA-C. She is a patient of Dr. Harl Bowie last seen in September 2017. I reviewed her records and updated the chart.  She is here today with a Spanish interpreter.  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  She does not report any chest pain or use of nitroglycerin.  She has a son that lives locally, her partner died 2 years ago.  We went over her medications which are outlined below.  It sounds like she was temporarily off of Norvasc, is on a lower dose now than she had been previously.  She states that she has been taking her medications regularly.  Recent labwork is outlined below. Her LDL was 51 in June.  She does not report any intolerances to Lipitor.  I personally reviewed her ECG today which shows sinus bradycardia with low voltage.  Past Medical History:  Diagnosis Date   Anemia    CAD (coronary artery disease)    [place] DES to LAD 2004   Essential hypertension    GERD (gastroesophageal reflux disease)    Migraine    Mixed hyperlipidemia    Obesity    Type 2 diabetes mellitus ([place])     Past Surgical History:  Procedure Laterality Date   CARDIAC CATHETERIZATION     CESAREAN SECTION  1982, 1985    Current Outpatient Medications  Medication Sig Dispense Refill   amLODipine (NORVASC) 5 MG tablet 1 po qd.  Tome una tableta por boca diaria 90 tablet 1   aspirin 81 MG tablet Take 1 tablet (81 mg total) by mouth daily.     atorvastatin (LIPITOR) 20 MG tablet TAKE 1 Tablet BY MOUTH EVERY NIGHT AT BEDTIME 90 tablet 1   fexofenadine  (ALLEGRA) 180 MG tablet Take 180 mg by mouth daily.     fish oil-omega-3 fatty acids 1000 MG capsule Take 2 capsules by mouth 2 (two) times daily.      gabapentin (NEURONTIN) 300 MG capsule 1 po bid.  Tome una tableta por boca dos veces diarias 60 capsule 4   glipiZIDE (GLUCOTROL) 10 MG tablet TAKE 2 Tablets BY MOUTH TWICE DAILY WITH A MEAL 360 tablet 1   insulin NPH-regular Human (NOVOLIN 70/30 RELION) (70-30) 100 UNIT/ML injection Inject 50 Units into the skin 2 (two) times daily with a meal. Inject 50 units SQ before breakfast & 50 units SQ before supper.  Inyecte 50 unidades subcutaneos antes de el desayuno y inyecte 50 unidades subcutaneos antes de la cena     lisinopril-hydrochlorothiazide (ZESTORETIC) 20-25 MG tablet TAKE 1 Tablet BY MOUTH EVERY DAY 90 tablet 1   metFORMIN (GLUCOPHAGE) 1000 MG tablet TAKE 1 Tablet  BY MOUTH TWICE DAILY 180 tablet 1   metoprolol tartrate (LOPRESSOR) 100 MG tablet 1 po bid.  Tome una tableta por boca dos veces diarias 180 tablet 1   naproxen (NAPROSYN) 500 MG tablet TAKE 1 TABLET BY MOUTH EVERY 12 HOURS AS NEEDED FOR PAIN. TOME UNA TABLETA POR BOCA CADA 12 HORAS CUANDO SEA NECESARIO PARA EL DOLOR 60 tablet 0  nitroGLYCERIN (NITROSTAT) 0.4 MG SL tablet Place 1 tablet under the tongue every 5 minutes as needed for chest pain.  ponga una tableta por abajo de la lengua cada 5 minutos cuando sea necesario para el dolor de pecho 25 tablet 3   omeprazole (PRILOSEC) 40 MG capsule TAKE 1 Capsule BY MOUTH ONCE DAILY 90 capsule 1   Sennosides (TGT NATURAL LAXATIVE PILLS) 25 MG TABS Take 16.12 mg by mouth 2 (two) times daily.      No current facility-administered medications for this visit.    Allergies:  Chlorine and Pollen extract   Social History: The patient  reports that she has never smoked. She has never used smokeless tobacco. She reports current alcohol use. She reports that she does not use drugs.   Family History: The patient's family history  includes Cancer in her sister and sister; Diabetes in her mother; Hypertension in her mother.   ROS:  Please see the history of present illness. Otherwise, complete review of systems is positive for none.  All other systems are reviewed and negative.   Physical Exam: VS:  BP (!) 170/90    Pulse 62    Temp 98.7 F (37.1 C)    Ht 5' (1.524 m)    Wt 156 lb (70.8 kg)    LMP 03/05/2015    SpO2 96%    BMI 30.47 kg/m , BMI Body mass index is 30.47 kg/m.  Wt Readings from Last 3 Encounters:  11/29/18 156 lb (70.8 kg)  11/15/18 152 lb (68.9 kg)  10/18/18 154 lb 8 oz (70.1 kg)    General: Patient appears comfortable at rest. HEENT: Conjunctiva and lids normal, wearing a mask. Neck: Supple, no elevated JVP or carotid bruits, no thyromegaly. Lungs: Clear to auscultation, nonlabored breathing at rest. Cardiac: Regular rate and rhythm, no S3, 2/6 systolic murmur, no pericardial rub. Abdomen: Soft, nontender, bowel sounds present. Extremities: No pitting edema, distal pulses 2+. Skin: Warm and dry. Musculoskeletal: No kyphosis. Neuropsychiatric: Alert and oriented x3, affect grossly appropriate.  ECG:  An ECG dated 01/03/2016 was personally reviewed today and demonstrated:  Sinus bradycardia.  Recent Labwork: 10/18/2018: ALT 14; AST 14; BUN 14; Creatinine, Ser 0.41; Potassium 4.0; Sodium 139     Component Value Date/Time   CHOL 90 10/18/2018 1035   TRIG 71 10/18/2018 1035   HDL 25 (L) 10/18/2018 1035   CHOLHDL 3.6 10/18/2018 1035   VLDL 14 10/18/2018 1035   LDLCALC 51 10/18/2018 1035    Other Studies Reviewed Today:  Echocardiogram, 03/13/2016: Study Conclusions  - Left ventricle: The cavity size was normal. Wall thickness was   normal. Systolic function was normal. The estimated ejection   fraction was in the range of 60% to 65%. Wall motion was normal;   there were no regional wall motion abnormalities. Left   ventricular diastolic function parameters were normal.  Cardiac  catheterization 2015 ([facility]): Left Main-normal Left Anterior Descending-proximal LAD stent is widely patent, D1 normal Left Circumflex-normal as were the 2 obtuse marginal branches. Right Coronary-large dominant vessel which was normal Normal left ventricular chamber size with normal wall motion the estimated EF is 60%  CONCLUSIONS:  1. Single vessel coronary disease which is nonobstructive 2. LAD stent is widely patent. 3. Normal left ventricular chamber size with normal wall motion, the estimated ejection fraction is 60%.   Assessment and Plan:  1.  CAD status post DES to the LAD in 2004, patent at follow-up angiography in 2015 through the [facility] system.  She does not report any active angina at this time or nitroglycerin use.  Regimen includes antiplatelets and statin.  ECG reviewed today.  We will schedule regular follow-up in 6 months.  2.  Heart murmur on examination, echocardiogram will be obtained for evaluation.  3.  Mixed hyperlipidemia, continues on Lipitor with recent LDL 51.  4.  Hypertension, blood pressure is elevated today.  She is on multimodal therapy including Zestoretic, Norvasc, and Lopressor.  Keep follow-up with PCP, may need further increase in Norvasc.  5.  Type 2 diabetes mellitus, on Glucotrol and Glucophage.  Recent hemoglobin A1c 8.5%.  Keep follow-up with PCP.  Medication Adjustments/Labs and Tests Ordered: Current medicines are reviewed at length with the patient today.  Concerns regarding medicines are outlined above.   Tests Ordered: Orders Placed This Encounter  Procedures   EKG 12-Lead   ECHOCARDIOGRAM COMPLETE    Medication Changes: No orders of the defined types were placed in this encounter.   Disposition:  Follow up 6 months in the [place] office.  Signed, Satira Sark, MD, [facility] 11/29/2018 11:25 AM    [place] [facility] [place], [place], [place] [address] Phone: [phone]; Fax: [phone]

## 2018-11-30 ENCOUNTER — Other Ambulatory Visit: Payer: Self-pay

## 2018-12-06 ENCOUNTER — Ambulatory Visit (HOSPITAL_COMMUNITY)
Admission: RE | Admit: 2018-12-06 | Discharge: 2018-12-06 | Disposition: A | Discharge status: Home / Self Care | Payer: Self-pay | Source: Ambulatory Visit | Attending: Physician Assistant | Admitting: Physician Assistant

## 2018-12-06 ENCOUNTER — Other Ambulatory Visit: Payer: Self-pay

## 2018-12-06 DIAGNOSIS — Z1239 Encounter for other screening for malignant neoplasm of breast: Secondary | ICD-10-CM | POA: Insufficient documentation

## 2018-12-23 ENCOUNTER — Other Ambulatory Visit: Payer: Self-pay

## 2019-01-13 ENCOUNTER — Other Ambulatory Visit: Payer: Self-pay

## 2019-01-13 ENCOUNTER — Ambulatory Visit (INDEPENDENT_AMBULATORY_CARE_PROVIDER_SITE_OTHER): Payer: Self-pay

## 2019-01-13 DIAGNOSIS — R011 Cardiac murmur, unspecified: Secondary | ICD-10-CM

## 2019-01-19 ENCOUNTER — Telehealth: Payer: Self-pay | Admitting: *Deleted

## 2019-01-19 NOTE — Telephone Encounter (Signed)
-----   Message from Satira Sark, MD sent at 01/14/2019  8:02 PM EDT ----- Results reviewed. Cardiac function normal with LVEF 60-65% and no major valvular abnormalities. Continue with current follow-up plan.

## 2019-01-24 NOTE — Telephone Encounter (Signed)
Patient notified and verbalized understanding.  Copy to pmd.

## 2019-01-24 NOTE — Telephone Encounter (Signed)
Returned call

## 2019-01-26 ENCOUNTER — Encounter: Payer: Self-pay | Admitting: *Deleted

## 2019-01-30 ENCOUNTER — Other Ambulatory Visit: Payer: Self-pay | Admitting: Physician Assistant

## 2019-01-31 ENCOUNTER — Other Ambulatory Visit: Payer: Self-pay | Admitting: Physician Assistant

## 2019-01-31 MED ORDER — NAPROXEN 500 MG PO TABS: ORAL_TABLET | ORAL | 1 refills | Status: AC

## 2019-02-14 ENCOUNTER — Other Ambulatory Visit (HOSPITAL_COMMUNITY)
Admission: RE | Admit: 2019-02-14 | Discharge: 2019-02-14 | Disposition: A | Discharge status: Home / Self Care | Payer: Self-pay | Source: Ambulatory Visit | Attending: Physician Assistant | Admitting: Physician Assistant

## 2019-02-14 ENCOUNTER — Other Ambulatory Visit: Payer: Self-pay

## 2019-02-14 ENCOUNTER — Ambulatory Visit: Payer: Self-pay | Admitting: Physician Assistant

## 2019-02-14 VITALS — BP 144/90 | HR 56 | Temp 98.6°F

## 2019-02-14 DIAGNOSIS — E1142 Type 2 diabetes mellitus with diabetic polyneuropathy: Secondary | ICD-10-CM

## 2019-02-14 DIAGNOSIS — E785 Hyperlipidemia, unspecified: Secondary | ICD-10-CM

## 2019-02-14 DIAGNOSIS — I10 Essential (primary) hypertension: Secondary | ICD-10-CM

## 2019-02-14 DIAGNOSIS — IMO0002 Reserved for concepts with insufficient information to code with codable children: Secondary | ICD-10-CM

## 2019-02-14 DIAGNOSIS — Z794 Long term (current) use of insulin: Secondary | ICD-10-CM

## 2019-02-14 DIAGNOSIS — E1165 Type 2 diabetes mellitus with hyperglycemia: Secondary | ICD-10-CM

## 2019-02-14 DIAGNOSIS — E118 Type 2 diabetes mellitus with unspecified complications: Secondary | ICD-10-CM | POA: Insufficient documentation

## 2019-02-14 DIAGNOSIS — Z789 Other specified health status: Secondary | ICD-10-CM

## 2019-02-14 DIAGNOSIS — I2510 Atherosclerotic heart disease of native coronary artery without angina pectoris: Secondary | ICD-10-CM | POA: Insufficient documentation

## 2019-02-14 LAB — COMPREHENSIVE METABOLIC PANEL
ALT: 13 U/L (ref 0–44)
AST: 16 U/L (ref 15–41)
Albumin: 4.1 g/dL (ref 3.5–5.0)
Alkaline Phosphatase: 102 U/L (ref 38–126)
Anion gap: 10 (ref 5–15)
BUN: 14 mg/dL (ref 6–20)
CO2: 28 mmol/L (ref 22–32)
Calcium: 9.3 mg/dL (ref 8.9–10.3)
Chloride: 100 mmol/L (ref 98–111)
Creatinine, Ser: 0.49 mg/dL (ref 0.44–1.00)
GFR calc Af Amer: >60 mL/min (ref >60)
GFR calc non Af Amer: >60 mL/min (ref >60)
Glucose, Bld: 177 mg/dL — ABNORMAL HIGH (ref 70–99)
Potassium: 4.1 mmol/L (ref 3.5–5.1)
Sodium: 138 mmol/L (ref 135–145)
Total Bilirubin: 0.5 mg/dL (ref 0.3–1.2)
Total Protein: 7.4 g/dL (ref 6.5–8.1)

## 2019-02-14 LAB — LIPID PANEL
Cholesterol: 113 mg/dL (ref 0–200)
HDL: 28 mg/dL — ABNORMAL LOW (ref >40)
LDL Cholesterol: 53 mg/dL (ref 0–99)
Total CHOL/HDL Ratio: 4 RATIO
Triglycerides: 158 mg/dL — ABNORMAL HIGH (ref <150)
VLDL: 32 mg/dL (ref 0–40)

## 2019-02-14 LAB — HEMOGLOBIN A1C
Hgb A1c MFr Bld: 10.1 % — ABNORMAL HIGH (ref 4.8–5.6)
Mean Plasma Glucose: 243.17 mg/dL

## 2019-02-14 NOTE — Progress Notes (Signed)
BP (!) 144/90   Pulse (!) 56   Temp 98.6 F (37 C)   LMP 03/05/2015   SpO2 97%    Subjective:    Patient ID: Yesenia Hoffman, female    DOB: Oct 19, 1965, 54 y.o.   MRN: [id number]  HPI: [name] is a 53 y.o. female presenting on 02/14/2019 for Diabetes, Hypertension, and Hyperlipidemia   HPI   Pt had a negative covid 19 screening questionnaire   She saw cardiology / Dr Domenic Polite in August  She is using insulin 50 and 55 units of the 70/30.  She just had her labs drawn this morning so a1c not yet resulted  Patient says she is feeling well and she has no complaints today   Relevant past medical, surgical, family and social history reviewed and updated as indicated. Interim medical history since our last visit reviewed. Allergies and medications reviewed and updated.    Current Outpatient Medications:  .  amLODipine (NORVASC) 5 MG tablet, 1 po qd.  Tome una tableta por boca diaria, Disp: 90 tablet, Rfl: 1 .  aspirin 81 MG tablet, Take 1 tablet (81 mg total) by mouth daily., Disp: , Rfl:  .  atorvastatin (LIPITOR) 20 MG tablet, TAKE 1 Tablet BY MOUTH EVERY NIGHT AT BEDTIME, Disp: 90 tablet, Rfl: 1 .  fexofenadine (ALLEGRA) 180 MG tablet, Take 180 mg by mouth daily., Disp: , Rfl:  .  fish oil-omega-3 fatty acids 1000 MG capsule, Take 2 capsules by mouth 2 (two) times daily. , Disp: , Rfl:  .  gabapentin (NEURONTIN) 300 MG capsule, 1 po bid.  Tome una tableta por [employer] veces diarias, Disp: 60 capsule, Rfl: 4 .  glipiZIDE (GLUCOTROL) 10 MG tablet, TAKE 2 Tablets BY MOUTH TWICE DAILY WITH A MEAL, Disp: 360 tablet, Rfl: 1 .  insulin NPH-regular Human (NOVOLIN 70/30 RELION) (70-30) 100 UNIT/ML injection, Inject 50 Units into the skin 2 (two) times daily with a meal. Inject 50 units SQ before breakfast & 50 units SQ before supper.  Inyecte 50 unidades subcutaneos antes de el desayuno y inyecte 50 unidades subcutaneos antes de la cena (Patient taking differently: Inject into the  skin 2 (two) times daily with a meal. Inject 55 units SQ before breakfast & 50 units SQ before supper.  Inyecte 55 unidades subcutaneos antes de el desayuno y inyecte 50 unidades subcutaneos antes de la cena), Disp: , Rfl:  .  lisinopril-hydrochlorothiazide (ZESTORETIC) 20-25 MG tablet, TAKE 1 Tablet BY MOUTH EVERY DAY, Disp: 90 tablet, Rfl: 1 .  metFORMIN (GLUCOPHAGE) 1000 MG tablet, TAKE 1 Tablet  BY MOUTH TWICE DAILY, Disp: 180 tablet, Rfl: 1 .  metoprolol tartrate (LOPRESSOR) 100 MG tablet, 1 po bid.  Tome una tableta por boca dos veces diarias, Disp: 180 tablet, Rfl: 1 .  naproxen (NAPROSYN) 500 MG tablet, TAKE 1 TABLET BY MOUTH EVERY 12 HOURS AS NEEDED FOR PAIN. TOME UNA TABLETA POR BOCA CADA 12 HORAS CUANDO SEA NECESARIO PARA EL DOLOR, Disp: 60 tablet, Rfl: 1 .  nitroGLYCERIN (NITROSTAT) 0.4 MG SL tablet, Place 1 tablet under the tongue every 5 minutes as needed for chest pain.  ponga una tableta por abajo de la lengua cada 5 minutos cuando sea necesario para el dolor de pecho, Disp: 25 tablet, Rfl: 3 .  omeprazole (PRILOSEC) 40 MG capsule, TAKE 1 Capsule BY MOUTH ONCE DAILY, Disp: 90 capsule, Rfl: 1 .  Sennosides (TGT NATURAL LAXATIVE PILLS) 25 MG TABS, Take 16.12 mg by mouth 2 (  two) times daily. , Disp: , Rfl:   Review of Systems  Per HPI unless specifically indicated above     Objective:    BP (!) 144/90   Pulse (!) 56   Temp 98.6 F (37 C)   LMP 03/05/2015   SpO2 97%   Wt Readings from Last 3 Encounters:  11/29/18 156 lb (70.8 kg)  11/15/18 152 lb (68.9 kg)  10/18/18 154 lb 8 oz (70.1 kg)    Physical Exam Vitals signs reviewed.  Constitutional:      General: She is not in acute distress.    Appearance: Normal appearance. She is well-developed. She is not ill-appearing.  HENT:     Head: Normocephalic and atraumatic.  Neck:     Musculoskeletal: Neck supple.  Cardiovascular:     Rate and Rhythm: Normal rate and regular rhythm.  Pulmonary:     Effort: Pulmonary effort is  normal.     Breath sounds: Normal breath sounds.  Abdominal:     General: Bowel sounds are normal.     Palpations: Abdomen is soft. There is no mass.     Tenderness: There is no abdominal tenderness.  Musculoskeletal:     Right lower leg: No edema.     Left lower leg: No edema.  Lymphadenopathy:     Cervical: No cervical adenopathy.  Skin:    General: Skin is warm and dry.  Neurological:     Mental Status: She is alert and oriented to person, place, and time.  Psychiatric:        Attention and Perception: Attention normal.        Speech: Speech normal.        Behavior: Behavior normal. Behavior is cooperative.     Results for orders placed or performed during the hospital encounter of 02/14/19  Lipid panel  Result Value Ref Range   Cholesterol 113 0 - 200 mg/dL   Triglycerides 158 (H) <150 mg/dL   HDL 28 (L) >40 mg/dL   Total CHOL/HDL Ratio 4.0 RATIO   VLDL 32 0 - 40 mg/dL   LDL Cholesterol 53 0 - 99 mg/dL  Comprehensive metabolic panel  Result Value Ref Range   Sodium 138 135 - 145 mmol/L   Potassium 4.1 3.5 - 5.1 mmol/L   Chloride 100 98 - 111 mmol/L   CO2 28 22 - 32 mmol/L   Glucose, Bld 177 (H) 70 - 99 mg/dL   BUN 14 6 - 20 mg/dL   Creatinine, Ser 0.49 0.44 - 1.00 mg/dL   Calcium 9.3 8.9 - 10.3 mg/dL   Total Protein 7.4 6.5 - 8.1 g/dL   Albumin 4.1 3.5 - 5.0 g/dL   AST 16 15 - 41 U/L   ALT 13 0 - 44 U/L   Alkaline Phosphatase 102 38 - 126 U/L   Total Bilirubin 0.5 0.3 - 1.2 mg/dL   GFR calc non Af Amer >60 >60 mL/min   GFR calc Af Amer >60 >60 mL/min   Anion gap 10 5 - 15      Assessment & Plan:    Encounter Diagnoses  Name Primary?  . Essential hypertension Yes  . Uncontrolled type 2 diabetes mellitus with complication, with long-term current use of insulin ([place])   . Coronary artery disease involving native coronary artery of native heart without angina pectoris   . Hyperlipidemia, unspecified hyperlipidemia type   . Non-English speaking patient   .  Diabetic polyneuropathy associated with type 2 diabetes mellitus ([place])      -  reviewed lipids and CMP with pt. will call patient with results of A1c when available. -Diabetic foot exam was updated today. -Patient will be scheduled to follow-up in 1 month to come in to update Pap smear.  Will recheck blood pressure at that time -Patient encouraged to monitor blood sugars -Patient is to continue current medications -Patient to continue with cardiology per their recommendations -Patient is encouraged to wear facemask when in public to reduce risk of contracting coronavirus per CDC recommendations

## 2019-02-21 ENCOUNTER — Encounter: Payer: Self-pay | Admitting: Physician Assistant

## 2019-03-03 ENCOUNTER — Other Ambulatory Visit: Payer: Self-pay | Admitting: Physician Assistant

## 2019-03-03 MED ORDER — LISINOPRIL-HYDROCHLOROTHIAZIDE 20-25 MG PO TABS: ORAL_TABLET | ORAL | 1 refills | Status: AC

## 2019-03-03 MED ORDER — AMLODIPINE BESYLATE 5 MG PO TABS: ORAL_TABLET | ORAL | 1 refills | Status: AC

## 2019-03-03 MED ORDER — METFORMIN HCL 1000 MG PO TABS: ORAL_TABLET | ORAL | 1 refills | Status: AC

## 2019-03-03 MED ORDER — ATORVASTATIN CALCIUM 20 MG PO TABS: ORAL_TABLET | ORAL | 1 refills | Status: AC

## 2019-03-03 MED ORDER — GABAPENTIN 300 MG PO CAPS: ORAL_CAPSULE | ORAL | 4 refills | Status: AC

## 2019-03-03 MED ORDER — OMEPRAZOLE 40 MG PO CPDR: DELAYED_RELEASE_CAPSULE | ORAL | 1 refills | Status: AC

## 2019-03-03 MED ORDER — METOPROLOL TARTRATE 100 MG PO TABS: ORAL_TABLET | ORAL | 1 refills | Status: AC

## 2019-03-03 MED ORDER — GLIPIZIDE 10 MG PO TABS: ORAL_TABLET | ORAL | 1 refills | Status: AC

## 2019-03-28 ENCOUNTER — Ambulatory Visit: Payer: Self-pay | Admitting: Physician Assistant

## 2019-03-28 ENCOUNTER — Other Ambulatory Visit: Payer: Self-pay

## 2019-03-28 DIAGNOSIS — Z20822 Contact with and (suspected) exposure to covid-19: Secondary | ICD-10-CM

## 2019-03-29 LAB — NOVEL CORONAVIRUS, NAA: SARS-CoV-2, NAA: DETECTED — AB

## 2019-04-04 ENCOUNTER — Ambulatory Visit: Payer: Self-pay | Admitting: Physician Assistant

## 2019-05-09 ENCOUNTER — Other Ambulatory Visit: Payer: Self-pay | Admitting: Physician Assistant

## 2019-09-05 ENCOUNTER — Other Ambulatory Visit: Payer: Self-pay | Admitting: Physician Assistant

## 2019-09-09 ENCOUNTER — Other Ambulatory Visit: Payer: Self-pay | Admitting: Physician Assistant

## 2021-03-20 IMAGING — MG DIGITAL SCREENING BILATERAL MAMMOGRAM WITH TOMO AND CAD
8 series · 8 of 24 positions shown · non-contrast
Comparison: Previous exam(s).

CLINICAL DATA: Screening.

EXAM:
DIGITAL SCREENING BILATERAL MAMMOGRAM WITH TOMO AND CAD

[R MLO synth-2D]
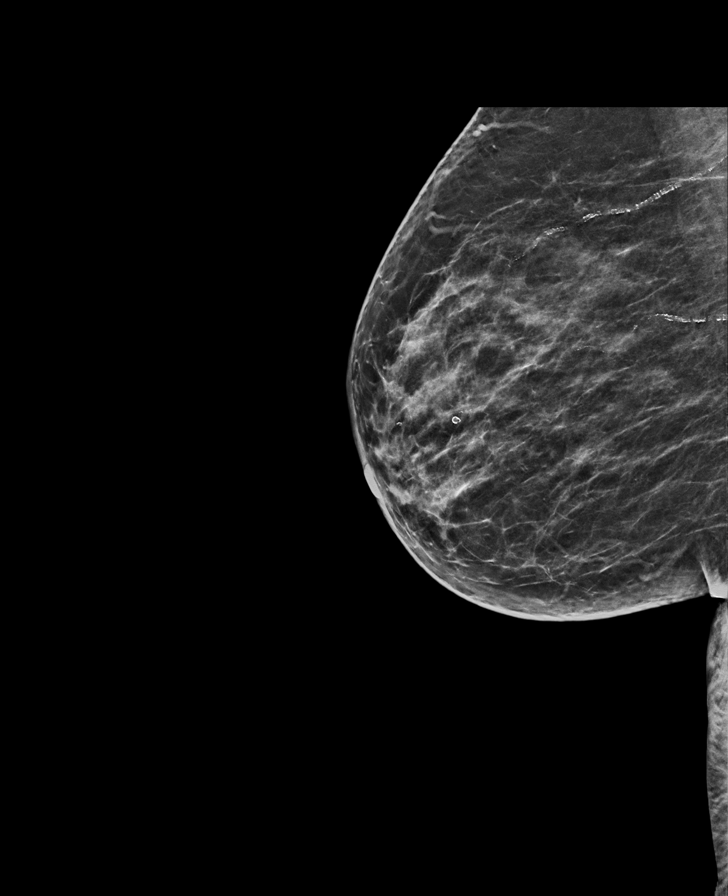

[R CC synth-2D]
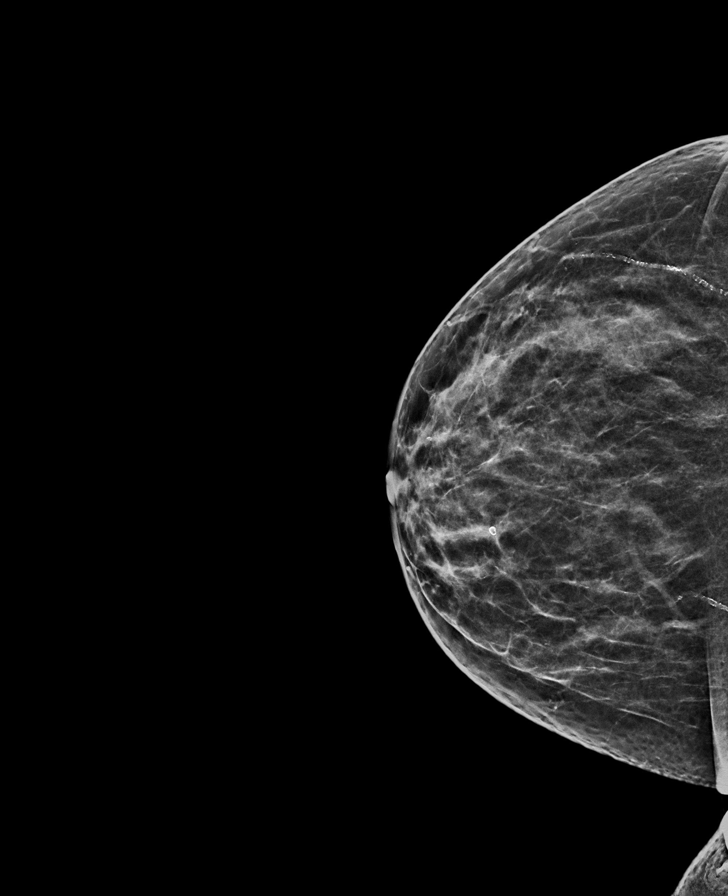

[L MLO synth-2D]
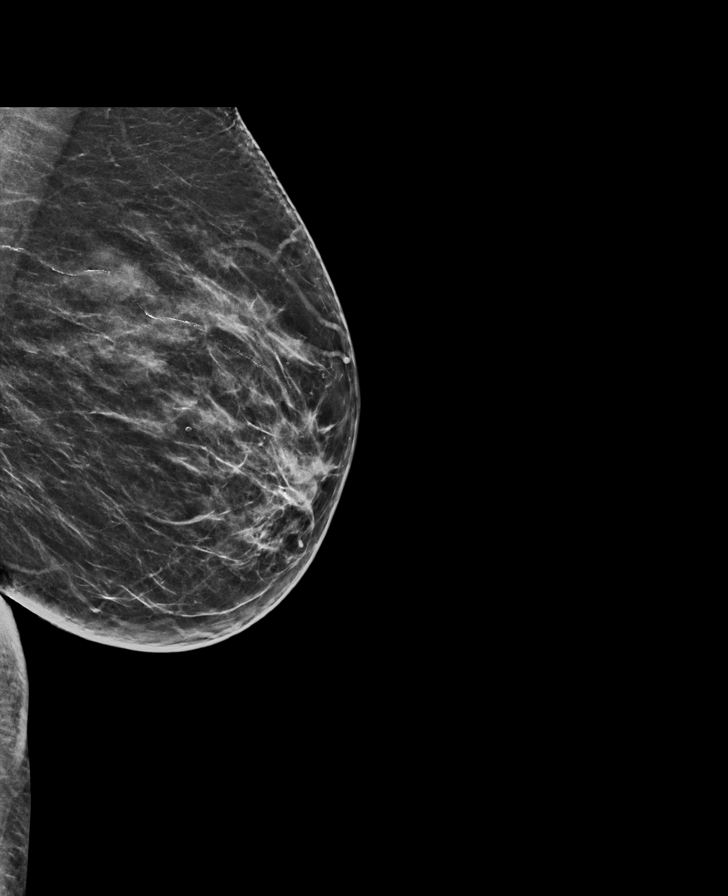

[L CC synth-2D]
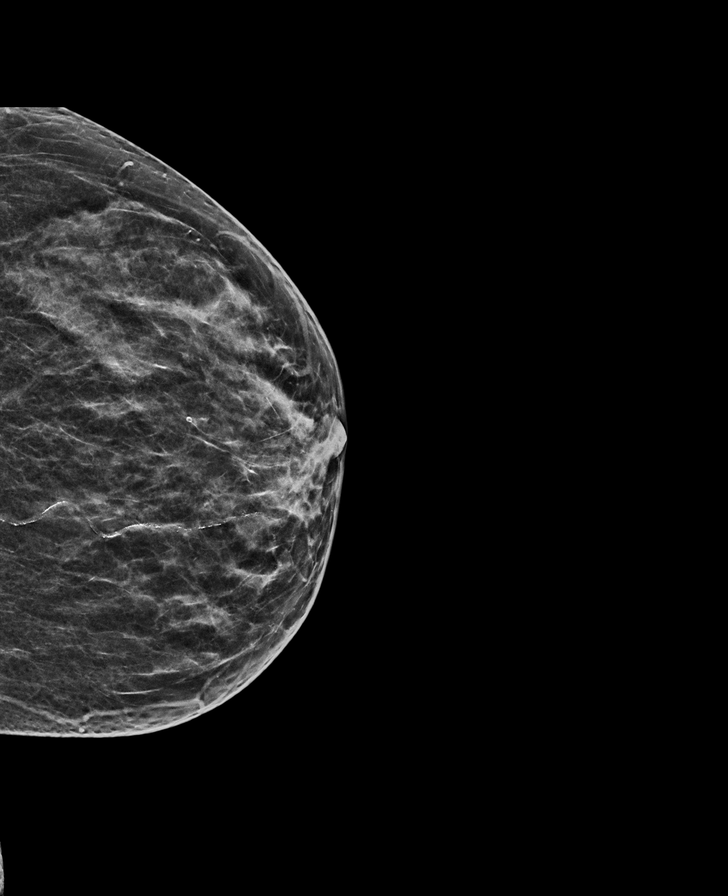

[R CC tomo · tomo slice 29/58.0]
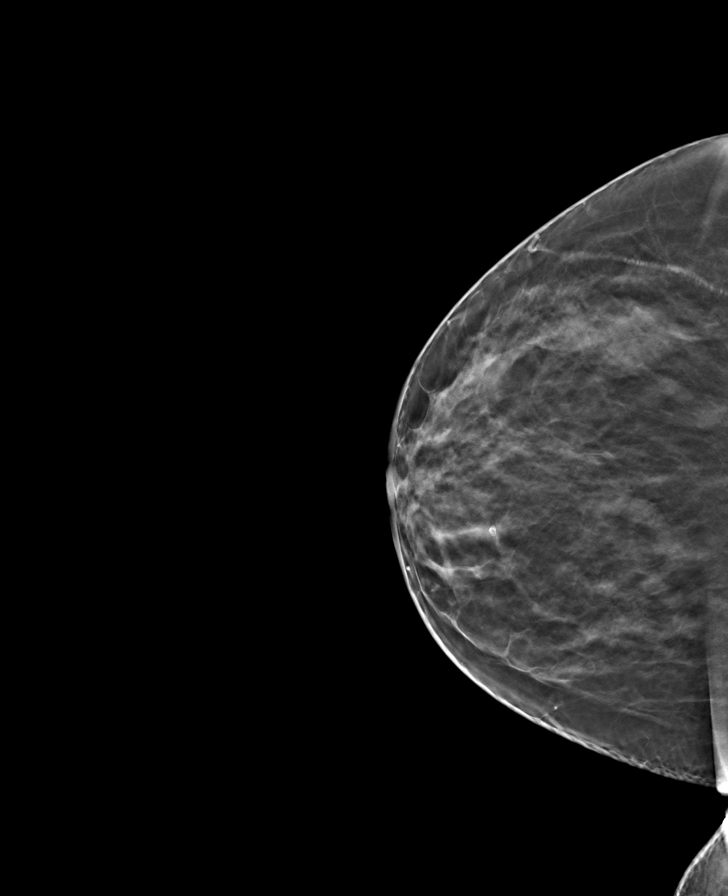

[R MLO tomo · tomo slice 31/62.0]
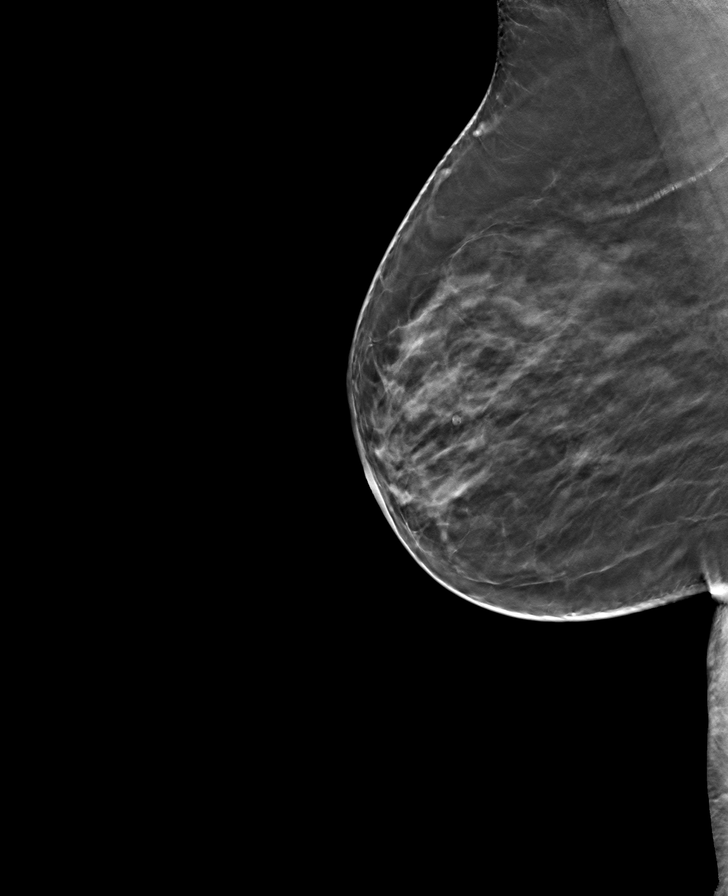

[L MLO tomo · tomo slice 32/63.0]
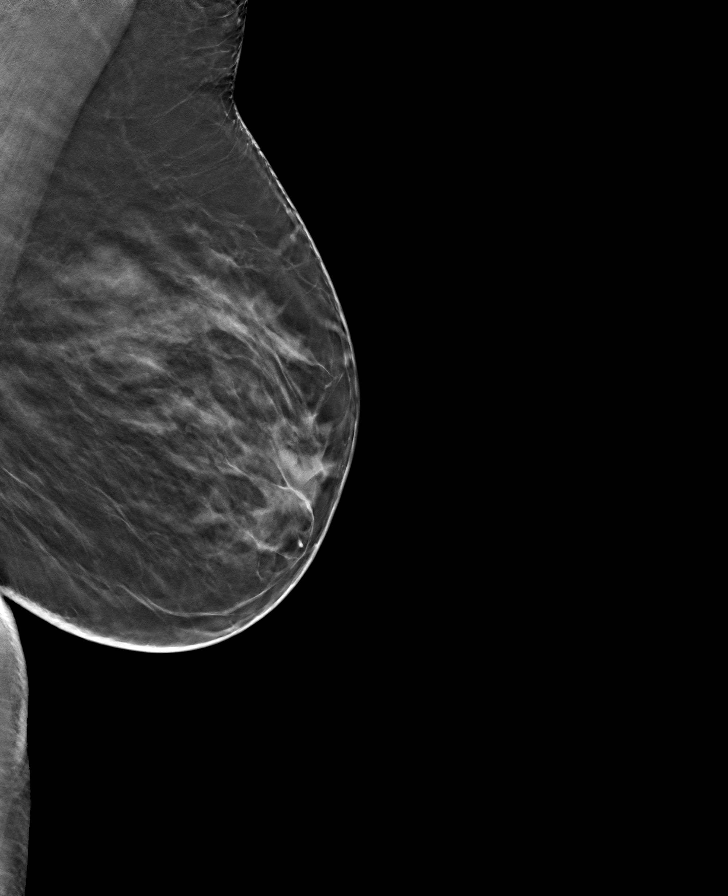

[L CC tomo · tomo slice 27/54.0]
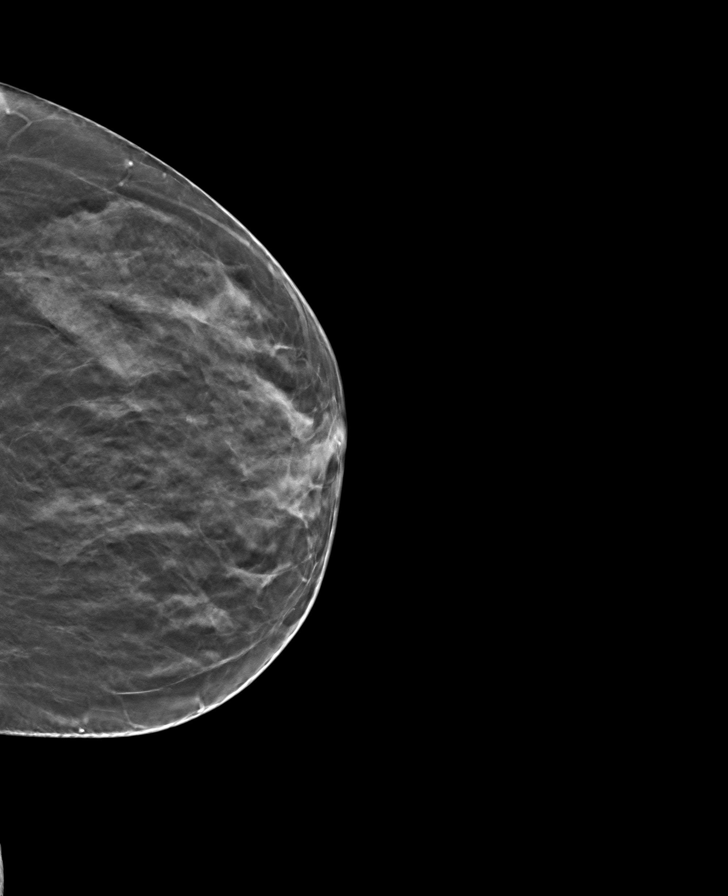

[8 of 24 positions shown; findings below may reference images not displayed]

ACR Breast Density Category c: The breast tissue is heterogeneously
dense, which may obscure small masses.
FINDINGS: There are no findings suspicious for malignancy. Images were
processed with CAD.
IMPRESSION: No mammographic evidence of malignancy. A result letter of this
screening mammogram will be mailed directly to the patient.

RECOMMENDATION:
Screening mammogram in one year. (Code:FT-U-LHB)

BI-RADS CATEGORY  1: Negative.
